# Patient Record
Sex: Female | Born: 1995 | Race: Black or African American | Hispanic: No | Marital: Single | Smoking: Never smoker
Health system: Southern US, Community
[De-identification: ages and names within clinical notes are randomized; demographics above are authoritative.]

## PROBLEM LIST (undated history)

## (undated) ENCOUNTER — Inpatient Hospital Stay (HOSPITAL_COMMUNITY): Payer: Self-pay

## (undated) DIAGNOSIS — Z789 Other specified health status: Secondary | ICD-10-CM

## (undated) HISTORY — PX: BREAST LUMPECTOMY: SHX2

## (undated) HISTORY — DX: Other specified health status: Z78.9

## (undated) HISTORY — PX: TOOTH EXTRACTION: SUR596

---
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
## 2013-01-24 NOTE — L&D Delivery Note (Signed)
Delivery Note At 9:33 AM a viable female was delivered via  (Presentation LOA: ;  ).  APGAR: 9, 9; weight .   Placenta status: Intact, Spontaneous.  Cord: 2 vessels with the following complications: None.  Cord pH: not sent  Anesthesia: Epidural  Episiotomy: none Lacerations:second degree perineal  Suture Repair: 2.0 chromic Est. Blood Loss (mL): 300cc Placenta ot pathology  Mom to postpartum.  Baby to Couplet care / Skin to Skin.  Debbie Mercer A 03/14/2013, 9:59 AM

## 2013-02-20 LAB — OB RESULTS CONSOLE GBS: STREP GROUP B AG: NEGATIVE

## 2013-03-03 ENCOUNTER — Inpatient Hospital Stay (HOSPITAL_COMMUNITY)
Admission: AD | Admit: 2013-03-03 | Discharge: 2013-03-03 | Disposition: A | Discharge status: Home / Self Care | Payer: BC Managed Care – PPO | Source: Ambulatory Visit | Attending: Obstetrics and Gynecology | Admitting: Obstetrics and Gynecology

## 2013-03-03 ENCOUNTER — Encounter (HOSPITAL_COMMUNITY): Payer: Self-pay | Admitting: Family

## 2013-03-03 DIAGNOSIS — O479 False labor, unspecified: Secondary | ICD-10-CM | POA: Insufficient documentation

## 2013-03-03 DIAGNOSIS — O368190 Decreased fetal movements, unspecified trimester, not applicable or unspecified: Secondary | ICD-10-CM | POA: Insufficient documentation

## 2013-03-03 LAB — WET PREP, GENITAL
Clue Cells Wet Prep HPF POC: NONE SEEN
Trich, Wet Prep: NONE SEEN
Yeast Wet Prep HPF POC: NONE SEEN

## 2013-03-03 NOTE — MAU Provider Note (Signed)
  History     CSN: [id number]  Arrival date and time: 03/03/13 1143   None     Chief Complaint  Patient presents with  . Decreased Fetal Movement   HPI Comments: Pt is a G1P0 at 5347w2d arrives unannounced w multiple c/o, indigestion, reflux, ?ctx, abd tightening, watery discharge, denies LOF, or VB, also c/o decreased FM for 3 days.      Past Medical History  Diagnosis Date  . Medical history non-contributory     Past Surgical History  Procedure Laterality Date  . Breast lumpectomy      Family History  Problem Relation Age of Onset  . Hypertension Mother   . Diabetes Maternal Grandmother     History  Substance Use Topics  . Smoking status: Never Smoker   . Smokeless tobacco: Never Used  . Alcohol Use: No    Allergies:  Allergies  Allergen Reactions  . Shellfish Allergy Anaphylaxis    Prescriptions prior to admission  Medication Sig Dispense Refill  . Prenatal Vit-Fe Fumarate-FA (PRENATAL MULTIVITAMIN) TABS tablet Take 1 tablet by mouth daily at 12 noon.        Review of Systems  Gastrointestinal: Positive for heartburn and abdominal pain.  Genitourinary: Negative for dysuria.   Physical Exam   Blood pressure 115/71, pulse 101, temperature 98.2 F (36.8 C), temperature source Oral, resp. rate 12, last menstrual period 06/02/2012.  Physical Exam  Nursing note and vitals reviewed. Constitutional: She is oriented to person, place, and time. She appears well-developed and well-nourished. No distress.  HENT:  Head: Normocephalic.  Eyes: Pupils are equal, round, and reactive to light.  Neck: Normal range of motion.  Cardiovascular: Normal rate.   Respiratory: Effort normal.  GI: Soft.  Genitourinary: Vaginal discharge found.  Normal appearing leukorrhea, cervix FT/th/-2 vtx ballottable   Musculoskeletal: Normal range of motion.  Neurological: She is alert and oriented to person, place, and time. She has normal reflexes.  Skin: Skin is warm and dry.   Psychiatric: She has a normal mood and affect. Her behavior is normal.    MAU Course  Procedures     Assessment and Plan  IUP at 8347w2d FHR cat 1 toco - irreg  Wet prep - neg UA neg  Normal discomforts of pregnancy rv'd FKC and labor, f/u routine   Asaiah Scarber M 03/03/2013, 1:28 PM

## 2013-03-03 NOTE — MAU Note (Signed)
18 yo, G1P0 at 6571w2d, presents for decreased fetal movement since Thursday. Reports watery discharge noted this morning. Denies VB. Some contractions earlier, none at this time. Denies HSV.

## 2013-03-03 NOTE — Discharge Instructions (Signed)
Fetal Movement Counts °Patient Name: __________________________________________________ Patient Due Date: ____________________ °Performing a fetal movement count is highly recommended in high-risk pregnancies, but it is good for every pregnant woman to do. Your caregiver may ask you to start counting fetal movements at 28 weeks of the pregnancy. Fetal movements often increase: °· After eating a full meal. °· After physical activity. °· After eating or drinking something sweet or cold. °· At rest. °Pay attention to when you feel the baby is most active. This will help you notice a pattern of your baby's sleep and wake cycles and what factors contribute to an increase in fetal movement. It is important to perform a fetal movement count at the same time each day when your baby is normally most active.  °HOW TO COUNT FETAL MOVEMENTS °1. Find a quiet and comfortable area to sit or lie down on your left side. Lying on your left side provides the best blood and oxygen circulation to your baby. °2. Write down the day and time on a sheet of paper or in a journal. °3. Start counting kicks, flutters, swishes, rolls, or jabs in a 2 hour period. You should feel at least 10 movements within 2 hours. °4. If you do not feel 10 movements in 2 hours, wait 2 3 hours and count again. Look for a change in the pattern or not enough counts in 2 hours. °SEEK MEDICAL CARE IF: °· You feel less than 10 counts in 2 hours, tried twice. °· There is no movement in over an hour. °· The pattern is changing or taking longer each day to reach 10 counts in 2 hours. °· You feel the baby is not moving as he or she usually does. °Date: ____________ Movements: ____________ Start time: ____________ Finish time: ____________  °Date: ____________ Movements: ____________ Start time: ____________ Finish time: ____________ °Date: ____________ Movements: ____________ Start time: ____________ Finish time: ____________ °Date: ____________ Movements: ____________  Start time: ____________ Finish time: ____________ °Date: ____________ Movements: ____________ Start time: ____________ Finish time: ____________ °Date: ____________ Movements: ____________ Start time: ____________ Finish time: ____________ °Date: ____________ Movements: ____________ Start time: ____________ Finish time: ____________ °Date: ____________ Movements: ____________ Start time: ____________ Finish time: ____________  °Date: ____________ Movements: ____________ Start time: ____________ Finish time: ____________ °Date: ____________ Movements: ____________ Start time: ____________ Finish time: ____________ °Date: ____________ Movements: ____________ Start time: ____________ Finish time: ____________ °Date: ____________ Movements: ____________ Start time: ____________ Finish time: ____________ °Date: ____________ Movements: ____________ Start time: ____________ Finish time: ____________ °Date: ____________ Movements: ____________ Start time: ____________ Finish time: ____________ °Date: ____________ Movements: ____________ Start time: ____________ Finish time: ____________  °Date: ____________ Movements: ____________ Start time: ____________ Finish time: ____________ °Date: ____________ Movements: ____________ Start time: ____________ Finish time: ____________ °Date: ____________ Movements: ____________ Start time: ____________ Finish time: ____________ °Date: ____________ Movements: ____________ Start time: ____________ Finish time: ____________ °Date: ____________ Movements: ____________ Start time: ____________ Finish time: ____________ °Date: ____________ Movements: ____________ Start time: ____________ Finish time: ____________ °Date: ____________ Movements: ____________ Start time: ____________ Finish time: ____________  °Date: ____________ Movements: ____________ Start time: ____________ Finish time: ____________ °Date: ____________ Movements: ____________ Start time: ____________ Finish time:  ____________ °Date: ____________ Movements: ____________ Start time: ____________ Finish time: ____________ °Date: ____________ Movements: ____________ Start time: ____________ Finish time: ____________ °Date: ____________ Movements: ____________ Start time: ____________ Finish time: ____________ °Date: ____________ Movements: ____________ Start time: ____________ Finish time: ____________ °Date: ____________ Movements: ____________ Start time: ____________ Finish time: ____________  °Date: ____________ Movements: ____________ Start time: ____________ Finish   time: ____________ °Date: ____________ Movements: ____________ Start time: ____________ Finish time: ____________ °Date: ____________ Movements: ____________ Start time: ____________ Finish time: ____________ °Date: ____________ Movements: ____________ Start time: ____________ Finish time: ____________ °Date: ____________ Movements: ____________ Start time: ____________ Finish time: ____________ °Date: ____________ Movements: ____________ Start time: ____________ Finish time: ____________ °Date: ____________ Movements: ____________ Start time: ____________ Finish time: ____________  °Date: ____________ Movements: ____________ Start time: ____________ Finish time: ____________ °Date: ____________ Movements: ____________ Start time: ____________ Finish time: ____________ °Date: ____________ Movements: ____________ Start time: ____________ Finish time: ____________ °Date: ____________ Movements: ____________ Start time: ____________ Finish time: ____________ °Date: ____________ Movements: ____________ Start time: ____________ Finish time: ____________ °Date: ____________ Movements: ____________ Start time: ____________ Finish time: ____________ °Date: ____________ Movements: ____________ Start time: ____________ Finish time: ____________  °Date: ____________ Movements: ____________ Start time: ____________ Finish time: ____________ °Date: ____________ Movements:  ____________ Start time: ____________ Finish time: ____________ °Date: ____________ Movements: ____________ Start time: ____________ Finish time: ____________ °Date: ____________ Movements: ____________ Start time: ____________ Finish time: ____________ °Date: ____________ Movements: ____________ Start time: ____________ Finish time: ____________ °Date: ____________ Movements: ____________ Start time: ____________ Finish time: ____________ °Date: ____________ Movements: ____________ Start time: ____________ Finish time: ____________  °Date: ____________ Movements: ____________ Start time: ____________ Finish time: ____________ °Date: ____________ Movements: ____________ Start time: ____________ Finish time: ____________ °Date: ____________ Movements: ____________ Start time: ____________ Finish time: ____________ °Date: ____________ Movements: ____________ Start time: ____________ Finish time: ____________ °Date: ____________ Movements: ____________ Start time: ____________ Finish time: ____________ °Date: ____________ Movements: ____________ Start time: ____________ Finish time: ____________ °Document Released: 02/09/2006 Document Revised: 12/28/2011 Document Reviewed: 11/07/2011 °Exit[employer]® Patient Information ©2014 [employer]. ° ° °Braxton Hicks Contractions °Pregnancy is commonly associated with contractions of the uterus throughout the pregnancy. Towards the end of pregnancy (32 to 34 weeks), these contractions (Braxton Hicks) can develop more often and may become more forceful. This is not true labor because these contractions do not result in opening (dilatation) and thinning of the cervix. They are sometimes difficult to tell apart from true labor because these contractions can be forceful and people have different pain tolerances. You should not feel embarrassed if you go to the hospital with false labor. Sometimes, the only way to tell if you are in true labor is for your caregiver to follow the changes  in the cervix. °How to tell the difference between true and false labor: °· False labor. °· The contractions of false labor are usually shorter, irregular and not as hard as those of true labor. °· They are often felt in the front of the lower abdomen and in the groin. °· They may leave with walking around or changing positions while lying down. °· They get weaker and are shorter lasting as time goes on. °· These contractions are usually irregular. °· They do not usually become progressively stronger, regular and closer together as with true labor. °· True labor. °· Contractions in true labor last 30 to 70 seconds, become very regular, usually become more intense, and increase in frequency. °· They do not go away with walking. °· The discomfort is usually felt in the top of the uterus and spreads to the lower abdomen and low back. °· True labor can be determined by your caregiver with an exam. This will show that the cervix is dilating and getting thinner. °If there are no prenatal problems or other health problems associated with the pregnancy, it is completely safe to be sent home with false labor and await the onset of   true labor. °HOME CARE INSTRUCTIONS  °· Keep up with your usual exercises and instructions. °· Take medications as directed. °· Keep your regular prenatal appointment. °· Eat and drink lightly if you think you are going into labor. °· If BH contractions are making you uncomfortable: °· Change your activity position from lying down or resting to walking/walking to resting. °· Sit and rest in a tub of warm water. °· Drink 2 to 3 glasses of water. Dehydration may cause B-H contractions. °· Do slow and deep breathing several times an hour. °SEEK IMMEDIATE MEDICAL CARE IF:  °· Your contractions continue to become stronger, more regular, and closer together. °· You have a gushing, burst or leaking of fluid from the vagina. °· An oral temperature above 102° F (38.9° C) develops. °· You have passage of  blood-tinged mucus. °· You develop vaginal bleeding. °· You develop continuous belly (abdominal) pain. °· You have low back pain that you never had before. °· You feel the baby's head pushing down causing pelvic pressure. °· The baby is not moving as much as it used to. °Document Released: 01/10/2005 Document Revised: 04/04/2011 Document Reviewed: 10/22/2012 °ExitCare® Patient Information ©2014 [employer]. ° ° °

## 2013-03-05 ENCOUNTER — Inpatient Hospital Stay (HOSPITAL_COMMUNITY)
Admission: AD | Admit: 2013-03-05 | Discharge: 2013-03-05 | Disposition: A | Discharge status: Home / Self Care | Payer: BC Managed Care – PPO | Source: Ambulatory Visit | Attending: Obstetrics and Gynecology | Admitting: Obstetrics and Gynecology

## 2013-03-05 ENCOUNTER — Encounter (HOSPITAL_COMMUNITY): Payer: Self-pay

## 2013-03-05 ENCOUNTER — Inpatient Hospital Stay (HOSPITAL_COMMUNITY): Payer: BC Managed Care – PPO

## 2013-03-05 DIAGNOSIS — R109 Unspecified abdominal pain: Secondary | ICD-10-CM | POA: Insufficient documentation

## 2013-03-05 DIAGNOSIS — O4690 Antepartum hemorrhage, unspecified, unspecified trimester: Secondary | ICD-10-CM | POA: Insufficient documentation

## 2013-03-05 DIAGNOSIS — Z91013 Allergy to seafood: Secondary | ICD-10-CM | POA: Diagnosis not present

## 2013-03-05 DIAGNOSIS — Z9889 Other specified postprocedural states: Secondary | ICD-10-CM

## 2013-03-05 DIAGNOSIS — O368190 Decreased fetal movements, unspecified trimester, not applicable or unspecified: Secondary | ICD-10-CM | POA: Insufficient documentation

## 2013-03-05 DIAGNOSIS — Q270 Congenital absence and hypoplasia of umbilical artery: Secondary | ICD-10-CM

## 2013-03-05 DIAGNOSIS — IMO0002 Reserved for concepts with insufficient information to code with codable children: Secondary | ICD-10-CM | POA: Diagnosis present

## 2013-03-05 DIAGNOSIS — O479 False labor, unspecified: Secondary | ICD-10-CM | POA: Insufficient documentation

## 2013-03-05 DIAGNOSIS — K219 Gastro-esophageal reflux disease without esophagitis: Secondary | ICD-10-CM | POA: Diagnosis not present

## 2013-03-05 NOTE — Discharge Instructions (Signed)

## 2013-03-05 NOTE — MAU Note (Signed)
History   18 yo G1P0 at 6238 4/7 weeks presented unannounced c/o crampng and small amount of bleeding this am.  Noting FM today since awakening.  Was seen in MAU on 2/8 for ? Leaking and decreased FM--negative findings, except for leukorrhea, cervix FT/thick, vtx, -2, ballotable.  [facility], with cervix FT, 50%, vtx, -2, no evidence SROM, and reactive NST.  Last US at 36 5/7 weeks, with EFW 7+15, 90%ile, AFI 15.7, 60%ile.  Next US scheduled 2/17.  Patient Active Problem List   Diagnosis Date Noted  . 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 GERD (gastroesophageal reflux disease) 03/05/2013  . LGA (large for gestational age) fetus--90%ile at 4436 5/7 weeks. 03/05/2013     Chief Complaint  Patient presents with  . Vaginal Bleeding   HPI:  See above.  OB History   Grav Para Term Preterm Abortions TAB SAB Ect Mult Living   1               Past Medical History  Diagnosis Date  . Medical history non-contributory     Past Surgical History  Procedure Laterality Date  . Breast lumpectomy      Family History  Problem Relation Age of Onset  . Hypertension Mother   . Diabetes Maternal Grandmother     History  Substance Use Topics  . Smoking status: Never Smoker   . Smokeless tobacco: Never Used  . Alcohol Use: No    Allergies:  Allergies  Allergen Reactions  . Shellfish Allergy Anaphylaxis    Prescriptions prior to admission  Medication Sig Dispense Refill  . Prenatal Vit-Fe Fumarate-FA (PRENATAL MULTIVITAMIN) TABS tablet Take 1 tablet by mouth daily at 12 noon.        ROS:  Cramping, bleeding, +FM Physical Exam   Blood pressure 119/71, pulse 105, temperature 98.2 F (36.8 C), temperature source Oral, last menstrual period 06/02/2012.  Physical Exam In NAD Chest clear Heart RRR without murmur Abd gravid, NT Pelvic--no active bleeding at present, cervix posterior, FT, 50%, vtx, -2,  small amount bloody show on glove after exam. Ext WNL  FHR segments of Category 1, with clear reactivity, then segments of less variability but no decels--? Sleep cycles. UCs q 7 min, mild.  ED Course  IUP at 38 4/7 weeks ? Early vs prodromal labor Vaginal bleeding  Plan: Observe  BPP/AFI   Mathius Birkeland CNM, [place] 03/05/2013 6:10 AM  Addendum: Returned from US--BPP 8/8, vtx, AFI 17.93, 72%ile. UCs remain q 7-8 min, mild. Small amount bloody mucus with wiping, no active bleeding noted.  Labor precautions reviewed, including FKCs. D/C'd home [facility] and US for growth. Encouraged patient to call with any questions or concerns. Note for work today.  Nigel BridgemanVicki Newell Wafer, CNM 03/05/13 7:20a

## 2013-03-05 NOTE — MAU Note (Signed)
Watery discharge 2 days ago and went to doctor's office yesterday and was told she was 1/2 cm.  Started having cramps this morning then noticed reddish bleeding like the "beginning of a period" , has gotten less since arriving here, pt stated.  Reports decreased baby movement for last 3 days.  States moves some when she eats but then he stops.

## 2013-03-12 ENCOUNTER — Encounter (HOSPITAL_COMMUNITY): Payer: Self-pay | Admitting: *Deleted

## 2013-03-12 ENCOUNTER — Observation Stay (HOSPITAL_COMMUNITY)
Admission: AD | Admit: 2013-03-12 | Discharge: 2013-03-12 | Disposition: A | Discharge status: Home / Self Care | Payer: BC Managed Care – PPO | Source: Ambulatory Visit | Attending: Obstetrics and Gynecology | Admitting: Obstetrics and Gynecology

## 2013-03-12 DIAGNOSIS — O479 False labor, unspecified: Principal | ICD-10-CM | POA: Insufficient documentation

## 2013-03-12 LAB — AMNISURE RUPTURE OF MEMBRANE (ROM) NOT AT ARMC: Amnisure ROM: NEGATIVE

## 2013-03-12 MED ORDER — BUTORPHANOL TARTRATE 1 MG/ML IJ SOLN
2.0000 mg | Freq: Once | INTRAMUSCULAR | Status: AC
  Administered 2013-03-12: 2 mg via INTRAMUSCULAR
  Filled 2013-03-12: qty 2

## 2013-03-12 MED ORDER — PROMETHAZINE HCL 25 MG/ML IJ SOLN: 12.5000 mg | Freq: Once | INTRAMUSCULAR | Status: DC

## 2013-03-12 MED ORDER — PROMETHAZINE HCL 25 MG/ML IJ SOLN
12.5000 mg | Freq: Once | INTRAMUSCULAR | Status: AC
  Administered 2013-03-12: 12.5 mg via INTRAMUSCULAR
  Filled 2013-03-12: qty 1

## 2013-03-12 MED ORDER — BUTORPHANOL TARTRATE 1 MG/ML IJ SOLN: 2.0000 mg | Freq: Once | INTRAMUSCULAR | Status: DC

## 2013-03-12 NOTE — MAU Note (Signed)
?   Leaking this morning when got up. No bleeding.  Some cramping.

## 2013-03-12 NOTE — Progress Notes (Signed)
Discharge note Pt still sleepy contractions have decreased in frequency BP 106/64  Pulse 111  Temp(Src) 98.1 F (36.7 C) (Oral)  Resp 18  LMP 06/02/2012 Category 1 toco q 15 min cx unchanged.   DC home with labor precautions

## 2013-03-12 NOTE — MAU Provider Note (Addendum)
  History     CSN: [id number]  Arrival date and time: 03/12/13 1033   None     Chief Complaint  Patient presents with  . Labor Eval   HPI Pt presents for contractions and questionable leakage of fluid  OB History   Grav Para Term Preterm Abortions TAB SAB Ect Mult Living   1               Past Medical History  Diagnosis Date  . Medical history non-contributory     Past Surgical History  Procedure Laterality Date  . Breast lumpectomy      Family History  Problem Relation Age of Onset  . Hypertension Mother   . Diabetes Maternal Grandmother     History  Substance Use Topics  . Smoking status: Never Smoker   . Smokeless tobacco: Never Used  . Alcohol Use: No    Allergies:  Allergies  Allergen Reactions  . Shellfish Allergy Anaphylaxis    Prescriptions prior to admission  Medication Sig Dispense Refill  . Prenatal Vit-Fe Fumarate-FA (PRENATAL MULTIVITAMIN) TABS tablet Take 1 tablet by mouth daily at 12 noon.        ROS Physical Exam   Blood pressure 116/76, pulse 113, temperature 98.1 F (36.7 C), temperature source Oral, resp. rate 18, last menstrual period 06/02/2012.  Physical Exam cx 4-5/80/-3  MAU Course  Procedures  MD   Assessment and Plan  Contractions without cervical change Will give stadol and phenergan and admit if cervical change NST category 1  Larina Lieurance A 03/12/2013, 12:45 PM

## 2013-03-12 NOTE — Discharge Instructions (Signed)
Natural Childbirth °Natural childbirth is going through labor and delivery without any drugs to relieve pain. You also do not use fetal monitors, have a cesarean delivery, or get a sugical cut to enlarge the vaginal opening (episiotomy). With the help of a birthing professional (midwife), you will direct your own labor and delivery as you choose. °Many women chose natural childbirth because they feel more in control and in touch with their labor and delivery. They are also concerned about the medications affecting themselves and the baby. °Pregnant women with a high risk pregnancy should not attempt natural childbirth. It is better to deliver the infant in a hospital if an emergency situation arises. Sometimes, the caregiver has to intervene for the health and safety of the mother and infant. °TWO TECHNIQUES FOR NATURAL CHILDBIRTH:  °· The Lamaze method. This method teaches women that having a baby is normal, healthy, and natural. It also teaches the mother to take a neutral position regarding pain medication and anesthesia and to make an informed decision if and when it is right for them. °· The Bradley method (also called husband coached birth). This method teaches the father to be the birth coach and stresses a natural approach. It also encourages exercise and a balanced diet with good nutrition. The exercises teach relaxation and deep breathing techniques. However, there are also classes to prepare the parents for an emergency situation that may occur. °METHODS OF DEALING WITH LABOR PAIN AND DELIVERY: °· Meditation. °· Yoga. °· Hypnosis. °· Acupuncture. °· Massage. °· Changing positions (walking, rocking, showering, leaning on birth balls). °· Lying in warm water or a jacuzzi. °· Find an activity that keeps your mind off of the labor pain. °· Listen to soft music. °· Visual imagery (focus on a particular object). °BEFORE GOING INTO LABOR °· Be sure you and your spouse/partner are in agreement to have natural  childbirth. °· Decide if your caregiver or a midwife will deliver your baby. °· Decide if you will have your baby in the hospital, birthing center, or at home. °· If you have children, make plans to have someone to take care of them when you go to the hospital. °· Know the distance and the time it takes to go to the delivery center. Make a dry run to be sure. °· Have a bag packed with a night gown, bathrobe, and toiletries ready to take when you go into labor. °· Keep phone numbers of your family and friends handy if you need to call someone when you go into labor. °· Your spouse or partner should go to all the teaching classes. °· Talk with your caregiver about the possibility of a medical emergency and what will happen if that occurs. °ADVANTAGES OF NATURAL CHILDBIRTH °· You are in control of your labor and delivery. °· It is safe. °· There are no medications or anesthetics that may affect you and the fetus. °· There are no invasive procedures such as an episiotomy. °· You and your partner will work together, which can increase your bond. °· Meditation, yoga, massage, and breathing exercises can be learned while pregnant and help you when you are in labor and at delivery. °· In most delivery centers, the family and friends can be involved in the labor and delivery process. °DISADVANTAGES OF NATURAL CHILDBIRTH °· You will experience pain during your labor and delivery. °· The methods of helping relieve your labor pains may not work for you. °· You may feel embarrassed, disappointed, and like a failure   if you decide to change your mind during labor and not have natural childbirth. °AFTER THE DELIVERY °· You will be very tired. °· You will be uncomfortable because of your uterus contracting. You will feel soreness around the vagina. °· You may feel cold and shaky.This is a natural reaction. °· You will be excited, overwhelmed, accomplished, and proud to be a mother. °HOME CARE INSTRUCTIONS  °· Follow the advice and  instructions of your caregiver. °· Follow the instructions of your natural childbirth instructor (Lamaze or Bradley Method). °Document Released: 12/24/2007 Document Revised: 04/04/2011 Document Reviewed: 09/17/2012 °ExitCare® Patient Information ©2014 [employer]. ° °

## 2013-03-13 ENCOUNTER — Inpatient Hospital Stay (HOSPITAL_COMMUNITY)
Admission: AD | Admit: 2013-03-13 | Discharge: 2013-03-16 | DRG: 775 | Disposition: A | Discharge status: Home / Self Care | Payer: BC Managed Care – PPO | Source: Ambulatory Visit | Attending: Obstetrics and Gynecology | Admitting: Obstetrics and Gynecology

## 2013-03-13 ENCOUNTER — Encounter (HOSPITAL_COMMUNITY): Payer: Self-pay | Admitting: *Deleted

## 2013-03-13 DIAGNOSIS — IMO0001 Reserved for inherently not codable concepts without codable children: Secondary | ICD-10-CM

## 2013-03-13 DIAGNOSIS — O99892 Other specified diseases and conditions complicating childbirth: Secondary | ICD-10-CM | POA: Diagnosis present

## 2013-03-13 DIAGNOSIS — K219 Gastro-esophageal reflux disease without esophagitis: Secondary | ICD-10-CM | POA: Diagnosis present

## 2013-03-13 DIAGNOSIS — O9989 Other specified diseases and conditions complicating pregnancy, childbirth and the puerperium: Secondary | ICD-10-CM

## 2013-03-13 LAB — CBC
HEMATOCRIT: 40.6 % (ref 36.0–46.0)
HEMOGLOBIN: 13.4 g/dL (ref 12.0–15.0)
MCH: 24.3 pg — AB (ref 26.0–34.0)
MCHC: 33 g/dL (ref 30.0–36.0)
MCV: 73.7 fL — ABNORMAL LOW (ref 78.0–100.0)
Platelets: 294 10*3/uL (ref 150–400)
RBC: 5.51 MIL/uL — ABNORMAL HIGH (ref 3.87–5.11)
RDW: 13.9 % (ref 11.5–15.5)
WBC: 16.5 10*3/uL — ABNORMAL HIGH (ref 4.0–10.5)

## 2013-03-13 MED ORDER — EPHEDRINE 5 MG/ML INJ
10.0000 mg | INTRAVENOUS | Status: DC | PRN
  Filled 2013-03-13: qty 2

## 2013-03-13 MED ORDER — OXYCODONE-ACETAMINOPHEN 5-325 MG PO TABS: 1.0000 | ORAL_TABLET | ORAL | Status: DC | PRN

## 2013-03-13 MED ORDER — LACTATED RINGERS IV SOLN
500.0000 mL | INTRAVENOUS | Status: DC | PRN
  Administered 2013-03-14 (×2): 500 mL via INTRAVENOUS

## 2013-03-13 MED ORDER — OXYTOCIN BOLUS FROM INFUSION: 500.0000 mL | INTRAVENOUS | Status: DC

## 2013-03-13 MED ORDER — LIDOCAINE HCL (PF) 1 % IJ SOLN
30.0000 mL | INTRAMUSCULAR | Status: AC | PRN
  Administered 2013-03-14: 30 mL via SUBCUTANEOUS
  Filled 2013-03-13: qty 30

## 2013-03-13 MED ORDER — EPHEDRINE 5 MG/ML INJ
10.0000 mg | INTRAVENOUS | Status: DC | PRN
  Filled 2013-03-13: qty 2
  Filled 2013-03-13 (×2): qty 4

## 2013-03-13 MED ORDER — OXYTOCIN 40 UNITS IN LACTATED RINGERS INFUSION - SIMPLE MED
62.5000 mL/h | INTRAVENOUS | Status: DC
  Administered 2013-03-14: 62.5 mL/h via INTRAVENOUS
  Filled 2013-03-13: qty 1000

## 2013-03-13 MED ORDER — ACETAMINOPHEN 325 MG PO TABS: 650.0000 mg | ORAL_TABLET | ORAL | Status: DC | PRN

## 2013-03-13 MED ORDER — LACTATED RINGERS IV SOLN
500.0000 mL | Freq: Once | INTRAVENOUS | Status: AC
  Administered 2013-03-13: 500 mL via INTRAVENOUS

## 2013-03-13 MED ORDER — PHENYLEPHRINE 40 MCG/ML (10ML) SYRINGE FOR IV PUSH (FOR BLOOD PRESSURE SUPPORT)
80.0000 ug | PREFILLED_SYRINGE | INTRAVENOUS | Status: DC | PRN
  Administered 2013-03-14 (×2): 80 ug via INTRAVENOUS
  Filled 2013-03-13 (×3): qty 10
  Filled 2013-03-13: qty 2

## 2013-03-13 MED ORDER — FLEET ENEMA 7-19 GM/118ML RE ENEM: 1.0000 | ENEMA | RECTAL | Status: DC | PRN

## 2013-03-13 MED ORDER — ONDANSETRON HCL 4 MG/2ML IJ SOLN: 4.0000 mg | Freq: Four times a day (QID) | INTRAMUSCULAR | Status: DC | PRN

## 2013-03-13 MED ORDER — IBUPROFEN 600 MG PO TABS: 600.0000 mg | ORAL_TABLET | Freq: Four times a day (QID) | ORAL | Status: DC | PRN

## 2013-03-13 MED ORDER — DIPHENHYDRAMINE HCL 50 MG/ML IJ SOLN: 12.5000 mg | INTRAMUSCULAR | Status: DC | PRN

## 2013-03-13 MED ORDER — FENTANYL 2.5 MCG/ML BUPIVACAINE 1/10 % EPIDURAL INFUSION (WH - ANES)
14.0000 mL/h | INTRAMUSCULAR | Status: DC | PRN
  Administered 2013-03-14: 14 mL/h via EPIDURAL
  Filled 2013-03-13 (×2): qty 125

## 2013-03-13 MED ORDER — LACTATED RINGERS IV SOLN
INTRAVENOUS | Status: DC
  Administered 2013-03-13 – 2013-03-14 (×2): via INTRAVENOUS

## 2013-03-13 MED ORDER — PHENYLEPHRINE 40 MCG/ML (10ML) SYRINGE FOR IV PUSH (FOR BLOOD PRESSURE SUPPORT)
80.0000 ug | PREFILLED_SYRINGE | INTRAVENOUS | Status: AC | PRN
  Administered 2013-03-14 (×3): 80 ug via INTRAVENOUS

## 2013-03-13 MED ORDER — CITRIC ACID-SODIUM CITRATE 334-500 MG/5ML PO SOLN: 30.0000 mL | ORAL | Status: DC | PRN

## 2013-03-13 NOTE — MAU Note (Signed)
Pt G1 at 39.5wks having contractions every .  Denies leaking or bleeding.

## 2013-03-14 ENCOUNTER — Inpatient Hospital Stay (HOSPITAL_COMMUNITY): Payer: BC Managed Care – PPO | Admitting: Anesthesiology

## 2013-03-14 ENCOUNTER — Encounter (HOSPITAL_COMMUNITY): Payer: BC Managed Care – PPO | Admitting: Anesthesiology

## 2013-03-14 ENCOUNTER — Encounter (HOSPITAL_COMMUNITY): Payer: Self-pay | Admitting: *Deleted

## 2013-03-14 LAB — RPR: RPR Ser Ql: NONREACTIVE

## 2013-03-14 MED ORDER — MEASLES, MUMPS & RUBELLA VAC ~~LOC~~ INJ: 0.5000 mL | INJECTION | Freq: Once | SUBCUTANEOUS | Status: DC

## 2013-03-14 MED ORDER — ZOLPIDEM TARTRATE 5 MG PO TABS: 5.0000 mg | ORAL_TABLET | Freq: Every evening | ORAL | Status: DC | PRN

## 2013-03-14 MED ORDER — SIMETHICONE 80 MG PO CHEW: 80.0000 mg | CHEWABLE_TABLET | ORAL | Status: DC | PRN

## 2013-03-14 MED ORDER — PRENATAL MULTIVITAMIN CH
1.0000 | ORAL_TABLET | Freq: Every day | ORAL | Status: DC
  Administered 2013-03-14 – 2013-03-15 (×2): 1 via ORAL
  Filled 2013-03-14 (×2): qty 1

## 2013-03-14 MED ORDER — DIPHENHYDRAMINE HCL 25 MG PO CAPS: 25.0000 mg | ORAL_CAPSULE | Freq: Four times a day (QID) | ORAL | Status: DC | PRN

## 2013-03-14 MED ORDER — FENTANYL CITRATE 0.05 MG/ML IJ SOLN: 100.0000 ug | INTRAMUSCULAR | Status: DC | PRN

## 2013-03-14 MED ORDER — SENNOSIDES-DOCUSATE SODIUM 8.6-50 MG PO TABS
2.0000 | ORAL_TABLET | ORAL | Status: DC
  Administered 2013-03-15: 2 via ORAL
  Filled 2013-03-14 (×2): qty 2

## 2013-03-14 MED ORDER — WITCH HAZEL-GLYCERIN EX PADS: 1.0000 "application " | MEDICATED_PAD | CUTANEOUS | Status: DC | PRN

## 2013-03-14 MED ORDER — ONDANSETRON HCL 4 MG PO TABS: 4.0000 mg | ORAL_TABLET | ORAL | Status: DC | PRN

## 2013-03-14 MED ORDER — TETANUS-DIPHTH-ACELL PERTUSSIS 5-2.5-18.5 LF-MCG/0.5 IM SUSP: 0.5000 mL | Freq: Once | INTRAMUSCULAR | Status: DC

## 2013-03-14 MED ORDER — IBUPROFEN 600 MG PO TABS
600.0000 mg | ORAL_TABLET | Freq: Four times a day (QID) | ORAL | Status: DC
  Administered 2013-03-14 – 2013-03-16 (×6): 600 mg via ORAL
  Filled 2013-03-14 (×7): qty 1

## 2013-03-14 MED ORDER — BENZOCAINE-MENTHOL 20-0.5 % EX AERO
1.0000 "application " | INHALATION_SPRAY | CUTANEOUS | Status: DC | PRN
  Administered 2013-03-14: 1 via TOPICAL
  Filled 2013-03-14: qty 56

## 2013-03-14 MED ORDER — OXYCODONE-ACETAMINOPHEN 5-325 MG PO TABS: 1.0000 | ORAL_TABLET | ORAL | Status: DC | PRN

## 2013-03-14 MED ORDER — ONDANSETRON HCL 4 MG/2ML IJ SOLN: 4.0000 mg | INTRAMUSCULAR | Status: DC | PRN

## 2013-03-14 MED ORDER — LIDOCAINE HCL (PF) 1 % IJ SOLN
INTRAMUSCULAR | Status: DC | PRN
  Administered 2013-03-14 (×4): 4 mL

## 2013-03-14 MED ORDER — LANOLIN HYDROUS EX OINT: TOPICAL_OINTMENT | CUTANEOUS | Status: DC | PRN

## 2013-03-14 MED ORDER — DIBUCAINE 1 % RE OINT: 1.0000 "application " | TOPICAL_OINTMENT | RECTAL | Status: DC | PRN

## 2013-03-14 NOTE — Progress Notes (Signed)
Patient ID: Debbie Mercer, female   DOB: [birth year], 18 y.o.   MRN: [id number] Debbie Mercer is a 18 y.o. G1P0000 at 8424w6d admitted for labor  Subjective: Comfortable w epidural   Objective: BP 120/74  Pulse 123  Temp(Src) 99.1 F (37.3 C) (Oral)  Resp 20  Ht 5\' 3"  (1.6 Mercer)  Wt 206 lb (93.441 kg)  BMI 36.50 kg/m2  SpO2 100%  LMP 06/02/2012  Total I/O In: -  Out: 225 [Urine:225]  FHT:  Cat 1/2, FHR decel w slow return to BL over 3min, mod variability, responded to IVF's, and phenylephreine given for hypotension  UC:   toco 2-3   SVE:   Dilation: Lip/rim Effacement (%): 100 Station: 0;+1 Exam by:: S Maren Wiesen CNM    Assessment / Plan:  Labor: slow change in transition phase Preeclampsia:  no s/s Fetal Wellbeing:  Category I and Category II Pain Control:  Epidural Anticipated MOD:  NSVD  GBS neg Expectant mgmnt at present, consider augmentation if no change in 2-3hrs Will await pt feeling more pressure to recheck   Dr Charlotta Newtonzan en route, viewed FHR tracing remotely Updated on status, and agrees w plan    Debbie Mercer 03/14/2013, 6:57 AM

## 2013-03-14 NOTE — Anesthesia Preprocedure Evaluation (Signed)
Anesthesia Evaluation  Patient identified by MRN, date of birth, ID band Patient awake    Reviewed: Allergy & Precautions, H&P , NPO status , Patient's Chart, lab work & pertinent test results, reviewed documented beta blocker date and time   History of Anesthesia Complications Negative for: history of anesthetic complications  Airway Mallampati: III TM Distance: >3 FB Neck ROM: full    Dental  (+) Teeth Intact   Pulmonary neg pulmonary ROS,  breath sounds clear to auscultation        Cardiovascular negative cardio ROS  Rhythm:regular Rate:Normal     Neuro/Psych negative neurological ROS  negative psych ROS   GI/Hepatic Neg liver ROS, GERD-  ,  Endo/Other  negative endocrine ROSBMI 37  Renal/GU negative Renal ROS     Musculoskeletal   Abdominal   Peds  Hematology negative hematology ROS (+)   Anesthesia Other Findings   Reproductive/Obstetrics (+) Pregnancy                           Anesthesia Physical Anesthesia Plan  ASA: II  Anesthesia Plan: Epidural   Post-op Pain Management:    Induction:   Airway Management Planned:   Additional Equipment:   Intra-op Plan:   Post-operative Plan:   Informed Consent: I have reviewed the patients History and Physical, chart, labs and discussed the procedure including the risks, benefits and alternatives for the proposed anesthesia with the patient or authorized representative who has indicated his/her understanding and acceptance.     Plan Discussed with:   Anesthesia Plan Comments:         Anesthesia Quick Evaluation

## 2013-03-14 NOTE — Progress Notes (Signed)
Patient ID: Debbie Mercer, female   DOB: 09/22/1995, 18 y.o.   MRN: [id number] Debbie Mercer is a 18 y.o. G1P0000 at 2576w6d admitted for labor   Subjective: Comfortable w epidural, feeling some pressure   Objective: BP 132/95  Pulse 118  Temp(Src) 99.1 F (37.3 C) (Oral)  Resp 18  Ht 5\' 3"  (1.6 m)  Wt 206 lb (93.441 kg)  BMI 36.50 kg/m2  SpO2 100%  LMP 06/02/2012     FHT:  Cat 1 UC:   toco 2-3   SVE:  Ant. Lip, vtx now at 0/+1   Assessment / Plan:  Labor: Progressing normally and slow progress in transition Preeclampsia:  no s/s Fetal Wellbeing:  Category I Pain Control:  Epidural Anticipated MOD:  NSVD  GBS neg  Slow change since admission, but vtx descending well,  Will allow laboring down until pt feeling stronger urge to push   Dr Charlotta Newtonzan updated    Malissa HippoLILLARD,Wonda Goodgame M 03/14/2013, 10:10 AM

## 2013-03-14 NOTE — H&P (Signed)
Debbie Mercer is a 18 y.o. female presenting for labor eval. Ctx all evening, now stronger, some bloody show earlier, denies LOF, GFM. VE=8/100/-1 BBOW   Pregnancy course: Pt began [facility] at 16wks, tx care to [facility] at 19wks.  Quad screen nl Anatomy US, 2VC noted, otherwise WNL  1hr gtt WNL at 28wks Growth [place] at 32wks 80%  Growth [place] at 36wks, 90% 7#15oz  GBS/GC/CT neg    Maternal Medical History:  Reason for admission: Contractions.   Contractions: Onset was 6-12 hours ago.   Frequency: regular.   Perceived severity is strong.    Fetal activity: Perceived fetal activity is normal.   Last perceived fetal movement was within the past hour.    Prenatal complications: no prenatal complications 2VC   Prenatal Complications - Diabetes: none.    OB History   Grav Para Term Preterm Abortions TAB SAB Ect Mult Living   1 0 0            Past Medical History  Diagnosis Date  . Medical history non-contributory    Past Surgical History  Procedure Laterality Date  . Breast lumpectomy     Family History: family history includes Diabetes in her maternal grandmother; Hypertension in her mother. Social History:  reports that she has never smoked. She has never used smokeless tobacco. She reports that she does not drink alcohol or use illicit drugs.   Prenatal Transfer Tool  Maternal Diabetes: No Genetic Screening: Normal Maternal Ultrasounds/Referrals: Normal 2VC, growth at 36w 7#15oz/90%  Fetal Ultrasounds or other Referrals:  None Maternal Substance Abuse:  No Significant Maternal Medications:  None Significant Maternal Lab Results:  Lab values include: Group B Strep negative Other Comments:  None  Review of Systems  All other systems reviewed and are negative.    Dilation: 9 Effacement (%): 100 Station: 0;+1 Exam by:: K Fraley RN Blood pressure 105/54, pulse 123, temperature 99.1 F (37.3 C), temperature source Oral, resp. rate 20, height 5\' 3"  (1.6 m), weight  206 lb (93.441 kg), last menstrual period 06/02/2012, SpO2 100.00%. Maternal Exam:  Uterine Assessment: Contraction strength is firm.  Contraction frequency is regular.   Abdomen: Patient reports no abdominal tenderness. Fundal height is aga.   Estimated fetal weight is 8-9#.   Fetal presentation: vertex  Introitus: Normal vulva. Normal vagina.  Ferning test: not done.   Pelvis: adequate for delivery.   Cervix: Cervix evaluated by digital exam.     Fetal Exam Fetal Monitor Review: Mode: ultrasound.    Fetal State Assessment: Category I - tracings are normal.     Physical Exam  Nursing note and vitals reviewed. Constitutional: She is oriented to person, place, and time. She appears well-developed and well-nourished.  HENT:  Head: Normocephalic.  Eyes: Pupils are equal, round, and reactive to light.  Neck: Normal range of motion.  Cardiovascular: Normal rate, regular rhythm and normal heart sounds.   Respiratory: Effort normal and breath sounds normal.  GI: Soft. Bowel sounds are normal.  Genitourinary: Vagina normal.  Musculoskeletal: Normal range of motion.  Neurological: She is alert and oriented to person, place, and time. She has normal reflexes.  Skin: Skin is warm and dry.  Psychiatric: She has a normal mood and affect. Her behavior is normal.    Prenatal labs: ABO, Rh: A/POS/-- (08/14 1030) Antibody: NEG (08/14 1030) Rubella: 4.60 (08/14 1030) RPR: NON REAC (08/14 1030)  HBsAg: NEGATIVE (08/14 1030)  HIV: NON REACTIVE (08/14 1030)  GBS: Negative (01/28  0000)   Assessment/Plan: IUP at 9238w6d Active labor FHR cat 1 toco q2 GBS neg  Admit to b.s per c/w DR Charlotta Newtonzan Routine L&D orders Expectant mgmnt  Epidural per pt request    Gwendloyn Forsee M 03/14/2013, 5:14 AM

## 2013-03-14 NOTE — Lactation Note (Signed)
This note was copied from the chart of [name]. Lactation Consultation Note MBU RN reports mom has changed to formula/bo feeding and "LEAD" teaching was done.  I visited at 10 hours to offer support.  Mom declines at this time and will contact us if needed.    Patient Name: Boy Gaetana Michaelisalen Shoun BJYNW'GToday's Date: 03/14/2013     Maternal Data    Feeding Feeding Type: Bottle Fed - Formula Nipple Type: Regular  LATCH Score/Interventions Latch: Too sleepy or reluctant, no latch achieved, no sucking elicited. Intervention(s): Skin to skin;Teach feeding cues;Waking techniques Intervention(s): Adjust position;Assist with latch;Breast massage;Breast compression  Audible Swallowing: None Intervention(s): Skin to skin;Hand expression  Type of Nipple: Everted at rest and after stimulation  Comfort (Breast/Nipple): Soft / non-tender     Hold (Positioning): Assistance needed to correctly position infant at breast and maintain latch. Intervention(s): Breastfeeding basics reviewed;Support Pillows;Position options;Skin to skin  LATCH Score: 5  Lactation Tools Discussed/Used     Consult Status Consult Status: Complete    Shoptaw, Arvella MerlesJana Lynn 03/14/2013, 8:09 PM

## 2013-03-14 NOTE — Progress Notes (Signed)
Patient ID: Debbie Mercer, female DOB: 07-17-1995, [age] y.o. MRN: [id number]  Debbie Aguasalen R Gafford is a [age] y.o. G1P0000 at 6526w6d admitted for labor  Subjective:  Comfortable w epidural  Objective:  BP 98/74  Pulse 120  Temp(Src) 100 F (37.8 C) (Oral)  Resp 20  Ht 5\' 3"  (1.6 m)  Wt 206 lb (93.441 kg)  BMI 36.50 kg/m2  SpO2 98%  LMP 06/02/2012   FHT: Cat 1, now, after epidural prolonged decel, resolved after BP improved, position changes IVF bolus  UC: toco q2-4  SVE: Dilation: 9  Effacement (%): 100  Station: -2  Exam by:: Larose KellsK Fraley RN    Assessment / Plan:  Labor: Progressing normally  Preeclampsia: no s/s  Fetal Wellbeing: Category I and Category II  Pain Control: Epidural  Anticipated MOD: NSVD   GBS neg Expectant mgmnt at present    Update physician PRN  Malissa HippoLILLARD,Dajai Wahlert M  03/14/2013, 1:37 AM

## 2013-03-14 NOTE — Anesthesia Postprocedure Evaluation (Signed)
Anesthesia Post Note  Patient: Debbie Mercer  Procedure(s) Performed: * No procedures listed *  Anesthesia type: Epidural  Patient location: Mother/Baby  Post pain: Pain level controlled  Post assessment: Post-op Vital signs reviewed  Last Vitals:  Filed Vitals:   03/14/13 1256  BP: 124/69  Pulse: 115  Temp: 37.2 C  Resp: 20    Post vital signs: Reviewed  Level of consciousness:alert  Complications: No apparent anesthesia complications

## 2013-03-14 NOTE — Anesthesia Procedure Notes (Signed)
Epidural Patient location during procedure: OB Start time: 03/14/2013 12:03 AM  Staffing Performed by: anesthesiologist   Preanesthetic Checklist Completed: patient identified, site marked, surgical consent, pre-op evaluation, timeout performed, IV checked, risks and benefits discussed and monitors and equipment checked  Epidural Patient position: sitting Prep: site prepped and draped and DuraPrep Patient monitoring: continuous pulse ox and blood pressure Approach: midline Injection technique: LOR air  Needle:  Needle type: Tuohy  Needle gauge: 17 G Needle length: 9 cm and 9 Needle insertion depth: 7.5 cm Catheter type: closed end flexible Catheter size: 19 Gauge Catheter at skin depth: 12.5 cm Test dose: negative  Assessment Events: blood not aspirated, injection not painful, no injection resistance, negative IV test and no paresthesia  Additional Notes Discussed risk of headache, infection, bleeding, nerve injury and failed or incomplete block.  Patient voices understanding and wishes to proceed.  Epidural placed easily on first attempt.  No paresthesia.  Patient tolerated procedure well with no apparent complications.  Jasmine DecemberA. Zaryia Markel, MDReason for block:procedure for pain

## 2013-03-15 LAB — CBC
HEMATOCRIT: 31.3 % — AB (ref 36.0–46.0)
HEMOGLOBIN: 10.3 g/dL — AB (ref 12.0–15.0)
MCH: 24.3 pg — ABNORMAL LOW (ref 26.0–34.0)
MCHC: 32.9 g/dL (ref 30.0–36.0)
MCV: 73.8 fL — ABNORMAL LOW (ref 78.0–100.0)
Platelets: 229 10*3/uL (ref 150–400)
RBC: 4.24 MIL/uL (ref 3.87–5.11)
RDW: 13.9 % (ref 11.5–15.5)
WBC: 16.6 10*3/uL — ABNORMAL HIGH (ref 4.0–10.5)

## 2013-03-15 MED ORDER — POLYSACCHARIDE IRON COMPLEX 150 MG PO CAPS
150.0000 mg | ORAL_CAPSULE | Freq: Every day | ORAL | Status: DC
  Administered 2013-03-15 – 2013-03-16 (×2): 150 mg via ORAL
  Filled 2013-03-15 (×2): qty 1

## 2013-03-15 NOTE — Progress Notes (Signed)
Post Partum Day 1 Subjective: no complaints, up ad lib, voiding, tolerating PO and + flatus breastfeeding with support and assistance  Objective: Blood pressure 100/65, pulse 102, temperature 97.7 F (36.5 C), temperature source Oral, resp. rate 17, height 5\' 3"  (1.6 m), weight 206 lb (93.441 kg), last menstrual period 06/02/2012, SpO2 98.00%, unknown if currently breastfeeding.  Physical Exam:  General: alert and no distress Lochia: appropriate Uterine Fundus: firm Incision: healing well DVT Evaluation: No evidence of DVT seen on physical exam. No significant calf/ankle edema. negativeHoman's sign.   Recent Labs  03/13/13 2315 03/15/13 0602  HGB 13.4 10.3*  HCT 40.6 31.3*    Assessment/Plan: Breastfeeding, Lactation consult and Contraception oral contraceptive pill Start Fe+ today Consider d/c: depending on pediatrician and breastfeeding progression Outpatient Circumcision    LOS: 2 days   Kenise Barraco M 03/15/2013, 11:20 AM

## 2013-03-16 MED ORDER — NORETHINDRONE 0.35 MG PO TABS: 1.0000 | ORAL_TABLET | Freq: Every day | ORAL | Status: DC

## 2013-03-16 NOTE — Discharge Instructions (Signed)
Vaginal Delivery Care After Refer to this sheet in the next few weeks. These discharge instructions provide you with information on caring for yourself after delivery. Your caregiver may also give you specific instructions. Your treatment has been planned according to the most current medical practices available, but problems sometimes occur. Call your caregiver if you have any problems or questions after you go home. HOME CARE INSTRUCTIONS  Take over-the-counter or prescription medicines only as directed by your caregiver or pharmacist. - you can take motrin/ibuprofen for discomfort or tylenol. You should take a stool softener/colace, 1-2x/day until after normal BM's   Do not drink alcohol, especially if you are breastfeeding or taking medicine to relieve pain.  Do not chew or smoke tobacco.  Do not use illegal drugs.  Continue to use good perineal care. Good perineal care includes:  Wiping your perineum from front to back.  Keeping your perineum clean.  Do not use tampons or douche until your caregiver says it is okay.  Shower, wash your hair, and take tub baths as directed by your caregiver.  Wear a well-fitting bra that provides breast support.  Eat healthy foods.  Drink enough fluids to keep your urine clear or pale yellow.  Eat high-fiber foods such as whole grain cereals and breads, brown rice, beans, and fresh fruits and vegetables every day. These foods may help prevent or relieve constipation.  Follow your cargiver's recommendations regarding resumption of activities such as climbing stairs, driving, lifting, exercising, or traveling.  Talk to your caregiver about resuming sexual activities. Resumption of sexual activities is dependent upon your risk of infection, your rate of healing, and your comfort and desire to resume sexual activity.  Try to have someone help you with your household activities and your newborn for at least a few days after you leave the  hospital.  Rest as much as possible. Try to rest or take a nap when your newborn is sleeping.  Increase your activities gradually.  Keep all of your scheduled postpartum appointments. It is very important to keep your scheduled follow-up appointments. At these appointments, your caregiver will be checking to make sure that you are healing physically and emotionally. SEEK MEDICAL CARE IF:   You are passing large clots from your vagina. Save any clots to show your caregiver.  You have a foul smelling discharge from your vagina.  You have trouble urinating.  You are urinating frequently.  You have pain when you urinate.  You have a change in your bowel movements.  You have increasing redness, pain, or swelling near your vaginal incision (episiotomy) or vaginal tear.  You have pus draining from your episiotomy or vaginal tear.  Your episiotomy or vaginal tear is separating.  You have painful, hard, or reddened breasts.  You have a severe headache.  You have blurred vision or see spots.  You feel sad or depressed.  You have thoughts of hurting yourself or your newborn.  You have questions about your care, the care of your newborn, or medicines.  You are dizzy or lightheaded.  You have a rash.  You have nausea or vomiting.  You were breastfeeding and have not had a menstrual period within 12 weeks after you stopped breastfeeding.  You are not breastfeeding and have not had a menstrual period by the 12th week after delivery.  You have a fever. SEEK IMMEDIATE MEDICAL CARE IF:   You have persistent pain.  You have chest pain.  You have shortness of breath.  You  faint.  You have leg pain.  You have stomach pain.  Your vaginal bleeding saturates two or more sanitary pads in 1 hour. MAKE SURE YOU:   Understand these instructions.  Will watch your condition.  Will get help right away if you are not doing well or get worse. Document Released: 01/08/2000  Document Revised: 10/05/2011 Document Reviewed: 09/07/2011 [facility] Patient Information 2014 [place], [place].  Postpartum Depression and Baby Blues The postpartum period begins right after the birth of a baby. During this time, there is often a great amount of joy and excitement. It is also a time of considerable changes in the life of the parent(s). Regardless of how many times a mother gives birth, each child brings new challenges and dynamics to the family. It is not unusual to have feelings of excitement accompanied by confusing shifts in moods, emotions, and thoughts. All mothers are at risk of developing postpartum depression or the "baby blues." These mood changes can occur right after giving birth, or they may occur many months after giving birth. The baby blues or postpartum depression can be mild or severe. Additionally, postpartum depression can resolve rather quickly, or it can be a long-term condition. CAUSES Elevated hormones and their rapid decline are thought to be a main cause of postpartum depression and the baby blues. There are a number of hormones that radically change during and after pregnancy. Estrogen and progesterone usually decrease immediately after delivering your baby. The level of thyroid hormone and various cortisol steroids also rapidly drop. Other factors that play a major role in these changes include major life events and genetics.  RISK FACTORS If you have any of the following risks for the baby blues or postpartum depression, know what symptoms to watch out for during the postpartum period. Risk factors that may increase the likelihood of getting the baby blues or postpartum depression include:  Havinga personal or family history of depression.  Having depression while being pregnant.  Having premenstrual or oral contraceptive-associated mood issues.  Having exceptional life stress.  Having marital conflict.  Lacking a social support network.  Having a  baby with special needs.  Having health problems such as diabetes. SYMPTOMS Baby blues symptoms include:  Brief fluctuations in mood, such as going from extreme happiness to sadness.  Decreased concentration.  Difficulty sleeping.  Crying spells, tearfulness.  Irritability.  Anxiety. Postpartum depression symptoms typically begin within the first month after giving birth. These symptoms include:  Difficulty sleeping or excessive sleepiness.  Marked weight loss.  Agitation.  Feelings of worthlessness.  Lack of interest in activity or food. Postpartum psychosis is a very concerning condition and can be dangerous. Fortunately, it is rare. Displaying any of the following symptoms is cause for immediate medical attention. Postpartum psychosis symptoms include:  Hallucinations and delusions.  Bizarre or disorganized behavior.  Confusion or disorientation. DIAGNOSIS  A diagnosis is made by an evaluation of your symptoms. There are no medical or lab tests that lead to a diagnosis, but there are various questionnaires that a caregiver may use to identify those with the baby blues, postpartum depression, or psychosis. Often times, a screening tool called the [place] Postnatal Depression Scale is used to diagnose depression in the postpartum period.  TREATMENT The baby blues usually goes away on its own in 1 to 2 weeks. Social support is often all that is needed. You should be encouraged to get adequate sleep and rest. Occasionally, you may be given medicines to help you sleep.  Postpartum  depression requires treatment as it can last several months or longer if it is not treated. Treatment may include individual or group therapy, medicine, or both to address any social, physiological, and psychological factors that may play a role in the depression. Regular exercise, a healthy diet, rest, and social support may also be strongly recommended.  Postpartum psychosis is more serious and  needs treatment right away. Hospitalization is often needed. HOME CARE INSTRUCTIONS  Get as much rest as you can. Nap when the baby sleeps.  Exercise regularly. Some women find yoga and walking to be beneficial.  Eat a balanced and nourishing diet.  Do little things that you enjoy. Have a cup of tea, take a bubble bath, read your favorite magazine, or listen to your favorite music.  Avoid alcohol.  Ask for help with household chores, cooking, grocery shopping, or running errands as needed. Do not try to do everything.  Talk to people close to you about how you are feeling. Get support from your partner, family members, friends, or other new moms.  Try to stay positive in how you think. Think about the things you are grateful for.  Do not spend a lot of time alone.  Only take medicine as directed by your caregiver.  Keep all your postpartum appointments.  Let your caregiver know if you have any concerns. SEEK MEDICAL CARE IF: You are having a reaction or problems with your medicine. SEEK IMMEDIATE MEDICAL CARE IF:  You have suicidal feelings.  You feel you may harm the baby or someone else. Document Released: 10/15/2003 Document Revised: 04/04/2011 Document Reviewed: 10/22/2012 [facility] Patient Information 2014 [place].  Iron-Rich Diet An iron-rich diet contains foods that are good sources of iron. Iron is an important mineral that helps your body produce hemoglobin. Hemoglobin is a protein in red blood cells that carries oxygen to the body's tissues. Sometimes, the iron level in your blood can be low. This may be caused by:  A lack of iron in your diet.  Blood loss.  Times of growth, such as during pregnancy or during a child's growth and development. Low levels of iron can cause a decrease in the number of red blood cells. This can result in iron deficiency anemia. Iron deficiency anemia symptoms include:  Tiredness.  Weakness.  Irritability.  Increased  chance of infection. Here are some recommendations for daily iron intake:  Males older than 20 years of age need 8 mg of iron per day.  Women ages 5919 to 3150 need 18 mg of iron per day.  Pregnant women need 27 mg of iron per day, and women who are over 219 years of age and breastfeeding need 9 mg of iron per day.  Women over the age of 20 need 8 mg of iron per day. SOURCES OF IRON There are 2 types of iron that are found in food: heme iron and nonheme iron. Heme iron is absorbed by the body better than nonheme iron. Heme iron is found in meat, poultry, and fish. Nonheme iron is found in grains, beans, and vegetables. Heme Iron Sources Food / Iron (mg)  Chicken liver, 3 oz (85 g)/ 10 mg  Beef liver, 3 oz (85 g)/ 5.5 mg  Oysters, 3 oz (85 g)/ 8 mg  Beef, 3 oz (85 g)/ 2 to 3 mg  Shrimp, 3 oz (85 g)/ 2.8 mg  [place], 3 oz (85 g)/ 2 mg  Chicken, 3 oz (85 g) / 1 mg  Fish (tuna, halibut),  3 oz (85 g)/ 1 mg  Pork, 3 oz (85 g)/ 0.9 mg Nonheme Iron Sources Food / Iron (mg)  Ready-to-eat breakfast cereal, iron-fortified / 3.9 to 7 mg  Tofu,  cup / 3.4 mg  Kidney beans,  cup / 2.6 mg  Baked potato with skin / 2.7 mg  Asparagus,  cup / 2.2 mg  Avocado / 2 mg  Dried peaches,  cup / 1.6 mg  Raisins,  cup / 1.5 mg  Soy milk, 1 cup / 1.5 mg  Whole-wheat bread, 1 slice / 1.2 mg  Spinach, 1 cup / 0.8 mg  Broccoli,  cup / 0.6 mg IRON ABSORPTION Certain foods can decrease the body's absorption of iron. Try to avoid these foods and beverages while eating meals with iron-containing foods:  Coffee.  Tea.  Fiber.  Soy. Foods containing vitamin C can help increase the amount of iron your body absorbs from iron sources, especially from nonheme sources. Eat foods with vitamin C along with iron-containing foods to increase your iron absorption. Foods that are high in vitamin C include many fruits and vegetables. Some good sources are:  Fresh orange  juice.  Oranges.  Strawberries.  Mangoes.  Grapefruit.  Red bell peppers.  Green bell peppers.  Broccoli.  Potatoes with skin.  Tomato juice. Document Released: 08/24/2004 Document Revised: 04/04/2011 Document Reviewed: 07/01/2010 [facility] Patient Information 2014 [place], [place].

## 2013-03-16 NOTE — Discharge Summary (Signed)
Vaginal Delivery Discharge Summary  Debbie Mercer  DOB:    1996/01/06 MRN:    [id number] CSN:    [id number]  Date of admission:                  03/13/13  Date of discharge:                   03/16/13   Procedures this admission: SVD, epidural,   Date of Delivery: 03/13/13 by Dr Normand Sloopillard   Newborn Data:  Live born female  Birth Weight: 8 lb 1.3 oz (3665 g) APGAR: 9, 9  Home with mother.  Circumcision Plan: outpatient   History of Present Illness:  Debbie Mercer is a 18 y.o. female, G1P1001, who presents at 5690w6d weeks gestation. [facility] She was admitted onset of labor. Her pregnancy has been complicated by: none.  Hospital course:  The patient was admitted for active labor.   Her labor was complicated by protracted transition phase. She proceeded to have a vaginal delivery of a healthy infant. Her delivery was not complicated. Her postpartum course was not complicated.  She was discharged to home on postpartum day 2 doing well.  Feeding:  breast  Contraception:  Depo-Provera - pt desires to receive at 6wks PP   Discharge hemoglobin:  Hemoglobin  Date Value Ref Range Status  03/15/2013 10.3* 12.0 - 15.0 g/dL Final     DELTA CHECK NOTED     REPEATED TO VERIFY     HCT  Date Value Ref Range Status  03/15/2013 31.3* 36.0 - 46.0 % Final    Discharge Physical Exam:   General: alert and no distress Lochia: appropriate Uterine Fundus: firm Incision: healing well DVT Evaluation: No evidence of DVT seen on physical exam. Negative Homan's sign. No significant calf/ankle edema.  Intrapartum Procedures: spontaneous vaginal delivery and epidural  Postpartum Procedures: none Complications-Operative and Postpartum: 2nd degree perineal laceration  Discharge Diagnoses: Term Pregnancy-delivered  Discharge Information:  Activity:           pelvic  rest Diet:                routine Medications: PNV Condition:      stable Instructions:   Postpartum Care After Vaginal Delivery  After you deliver your newborn (postpartum period), the usual stay in the hospital is 24 72 hours. If there were problems with your labor or delivery, or if you have other medical problems, you might be in the hospital longer.  While you are in the hospital, you will receive help and instructions on how to care for yourself and your newborn during the postpartum period.  While you are in the hospital:  Be sure to tell your nurses if you have pain or discomfort, as well as where you feel the pain and what makes the pain worse.  If you had an incision made near your vagina (episiotomy) or if you had some tearing during delivery, the nurses may put ice packs on your episiotomy or tear. The ice packs may help to reduce the pain and swelling.  If you are breastfeeding, you may feel uncomfortable contractions of your uterus for a couple of weeks. This is normal. The contractions help your uterus get back to normal size.  It is normal to have some bleeding after delivery.  For the first 1 3 days after delivery, the flow is  red and the amount may be similar to a period.  It is common for the flow to start and stop.  In the first few days, you may pass some small clots. Let your nurses know if you begin to pass large clots or your flow increases.  Do not  flush blood clots down the toilet before having the nurse look at them.  During the next 3 10 days after delivery, your flow should become more watery and pink or brown-tinged in color.  Ten to fourteen days after delivery, your flow should be a small amount of yellowish-white discharge.  The amount of your flow will decrease over the first few weeks after delivery. Your flow may stop in 6 8 weeks. Most women have had their flow stop by 12 weeks after delivery.  You should change your sanitary pads  frequently.  Wash your hands thoroughly with soap and water for at least 20 seconds after changing pads, using the toilet, or before holding or feeding your newborn.  You should feel like you need to empty your bladder within the first 6 8 hours after delivery.  In case you become weak, lightheaded, or faint, call your nurse before you get out of bed for the first time and before you take a shower for the first time.  Within the first few days after delivery, your breasts may begin to feel tender and full. This is called engorgement. Breast tenderness usually goes away within 48 72 hours after engorgement occurs. You may also notice milk leaking from your breasts. If you are not breastfeeding, do not stimulate your breasts. Breast stimulation can make your breasts produce more milk.  Spending as much time as possible with your newborn is very important. During this time, you and your newborn can feel close and get to know each other. Having your newborn stay in your room (rooming in) will help to strengthen the bond with your newborn. It will give you time to get to know your newborn and become comfortable caring for your newborn.  Your hormones change after delivery. Sometimes the hormone changes can temporarily cause you to feel sad or tearful. These feelings should not last more than a few days. If these feelings last longer than that, you should talk to your caregiver.  If desired, talk to your caregiver about methods of family planning or contraception.  Talk to your caregiver about immunizations. Your caregiver may want you to have the following immunizations before leaving the hospital:  Tetanus, diphtheria, and pertussis (Tdap) or tetanus and diphtheria (Td) immunization. It is very important that you and your family (including grandparents) or others caring for your newborn are up-to-date with the Tdap or Td immunizations. The Tdap or Td immunization can help protect your newborn from  getting ill.  Rubella immunization.  Varicella (chickenpox) immunization.  Influenza immunization. You should receive this annual immunization if you did not receive the immunization during your pregnancy. Document Released: 11/07/2006 Document Revised: 10/05/2011 Document Reviewed: 09/07/2011 [facility] Patient Information 2014 [place], [place].   Postpartum Depression and Baby Blues  The postpartum period begins right after the birth of a baby. During this time, there is often a great amount of joy and excitement. It is also a time of considerable changes in the life of the parent(s). Regardless of how many times a mother gives birth, each child brings new challenges and dynamics to the family. It is not unusual to have feelings of excitement accompanied by confusing shifts in moods,  emotions, and thoughts. All mothers are at risk of developing postpartum depression or the "baby blues." These mood changes can occur right after giving birth, or they may occur many months after giving birth. The baby blues or postpartum depression can be mild or severe. Additionally, postpartum depression can resolve rather quickly, or it can be a long-term condition. CAUSES Elevated hormones and their rapid decline are thought to be a main cause of postpartum depression and the baby blues. There are a number of hormones that radically change during and after pregnancy. Estrogen and progesterone usually decrease immediately after delivering your baby. The level of thyroid hormone and various cortisol steroids also rapidly drop. Other factors that play a major role in these changes include major life events and genetics.  RISK FACTORS If you have any of the following risks for the baby blues or postpartum depression, know what symptoms to watch out for during the postpartum period. Risk factors that may increase the likelihood of getting the baby blues or postpartum depression include:  Havinga personal or family  history of depression.  Having depression while being pregnant.  Having premenstrual or oral contraceptive-associated mood issues.  Having exceptional life stress.  Having marital conflict.  Lacking a social support network.  Having a baby with special needs.  Having health problems such as diabetes. SYMPTOMS Baby blues symptoms include:  Brief fluctuations in mood, such as going from extreme happiness to sadness.  Decreased concentration.  Difficulty sleeping.  Crying spells, tearfulness.  Irritability.  Anxiety. Postpartum depression symptoms typically begin within the first month after giving birth. These symptoms include:  Difficulty sleeping or excessive sleepiness.  Marked weight loss.  Agitation.  Feelings of worthlessness.  Lack of interest in activity or food. Postpartum psychosis is a very concerning condition and can be dangerous. Fortunately, it is rare. Displaying any of the following symptoms is cause for immediate medical attention. Postpartum psychosis symptoms include:  Hallucinations and delusions.  Bizarre or disorganized behavior.  Confusion or disorientation. DIAGNOSIS  A diagnosis is made by an evaluation of your symptoms. There are no medical or lab tests that lead to a diagnosis, but there are various questionnaires that a caregiver may use to identify those with the baby blues, postpartum depression, or psychosis. Often times, a screening tool called the [place] Postnatal Depression Scale is used to diagnose depression in the postpartum period.  TREATMENT The baby blues usually goes away on its own in 1 to 2 weeks. Social support is often all that is needed. You should be encouraged to get adequate sleep and rest. Occasionally, you may be given medicines to help you sleep.  Postpartum depression requires treatment as it can last several months or longer if it is not treated. Treatment may include individual or group therapy, medicine, or  both to address any social, physiological, and psychological factors that may play a role in the depression. Regular exercise, a healthy diet, rest, and social support may also be strongly recommended.  Postpartum psychosis is more serious and needs treatment right away. Hospitalization is often needed. HOME CARE INSTRUCTIONS  Get as much rest as you can. Nap when the baby sleeps.  Exercise regularly. Some women find yoga and walking to be beneficial.  Eat a balanced and nourishing diet.  Do little things that you enjoy. Have a cup of tea, take a bubble bath, read your favorite magazine, or listen to your favorite music.  Avoid alcohol.  Ask for help with household chores, cooking, grocery shopping,  or running errands as needed. Do not try to do everything.  Talk to people close to you about how you are feeling. Get support from your partner, family members, friends, or other new moms.  Try to stay positive in how you think. Think about the things you are grateful for.  Do not spend a lot of time alone.  Only take medicine as directed by your caregiver.  Keep all your postpartum appointments.  Let your caregiver know if you have any concerns. SEEK MEDICAL CARE IF: You are having a reaction or problems with your medicine. SEEK IMMEDIATE MEDICAL CARE IF:  You have suicidal feelings.  You feel you may harm the baby or someone else. Document Released: 10/15/2003 Document Revised: 04/04/2011 Document Reviewed: 11/16/2010 [facility] Patient Information 2014 [place], [place].   Discharge to: home  Follow-up Information   Follow up with [facility] In 6 weeks.   Specialty:  Obstetrics and Gynecology   Contact information:   [address] [place] [address] [phone]       Malissa Hippo 03/16/2013

## 2013-03-17 NOTE — Lactation Note (Signed)
Lactation Consultation Note Follow up consult:  Mother came to MAU requesting a breast pump, she said she was told she would get one before leaving. Mother is engorged and previous note states she had switched to formula.  Mother's milk has now transitioned. Mother states she wants to breastfeed now. Reviewed engorgement care with mother and provided her with a manual breast pump.  Encouraged she put baby to the breast 8-12 times a day or pump to establish her milk supply.  Suggested she come in for a one on one appointment but she said she would come to support group instead.   Patient Name: Dimas Aguasalen R Stankowski JXBJY'NToday's Date: 03/17/2013     Maternal Data    Feeding    LATCH Score/Interventions                      Lactation Tools Discussed/Used     Consult Status      Dahlia ByesBerkelhammer, Zyair Russi [facility] 03/17/2013, 10:56 AM

## 2013-03-18 ENCOUNTER — Inpatient Hospital Stay (HOSPITAL_COMMUNITY)
Admission: AD | Admit: 2013-03-18 | Discharge: 2013-03-21 | DRG: 776 | Disposition: A | Discharge status: Home / Self Care | Payer: BC Managed Care – PPO | Source: Ambulatory Visit | Attending: Obstetrics and Gynecology | Admitting: Obstetrics and Gynecology

## 2013-03-18 ENCOUNTER — Encounter (HOSPITAL_COMMUNITY): Payer: Self-pay

## 2013-03-18 DIAGNOSIS — N10 Acute tubulo-interstitial nephritis: Secondary | ICD-10-CM

## 2013-03-18 DIAGNOSIS — A498 Other bacterial infections of unspecified site: Secondary | ICD-10-CM | POA: Diagnosis present

## 2013-03-18 DIAGNOSIS — R51 Headache: Secondary | ICD-10-CM | POA: Diagnosis present

## 2013-03-18 DIAGNOSIS — O2390 Unspecified genitourinary tract infection in pregnancy, unspecified trimester: Principal | ICD-10-CM | POA: Diagnosis present

## 2013-03-18 DIAGNOSIS — O864 Pyrexia of unknown origin following delivery: Secondary | ICD-10-CM | POA: Diagnosis present

## 2013-03-18 DIAGNOSIS — R509 Fever, unspecified: Secondary | ICD-10-CM | POA: Diagnosis present

## 2013-03-18 LAB — COMPREHENSIVE METABOLIC PANEL
ALT: 24 U/L (ref 0–35)
AST: 27 U/L (ref 0–37)
Albumin: 2.4 g/dL — ABNORMAL LOW (ref 3.5–5.2)
Alkaline Phosphatase: 123 U/L — ABNORMAL HIGH (ref 39–117)
BUN: 10 mg/dL (ref 6–23)
CO2: 24 meq/L (ref 19–32)
Calcium: 8.3 mg/dL — ABNORMAL LOW (ref 8.4–10.5)
Chloride: 103 mEq/L (ref 96–112)
Creatinine, Ser: 0.69 mg/dL (ref 0.50–1.10)
GLUCOSE: 86 mg/dL (ref 70–99)
Potassium: 3.9 mEq/L (ref 3.7–5.3)
Sodium: 139 mEq/L (ref 137–147)
Total Bilirubin: 0.9 mg/dL (ref 0.3–1.2)
Total Protein: 5.9 g/dL — ABNORMAL LOW (ref 6.0–8.3)

## 2013-03-18 LAB — URINALYSIS, ROUTINE W REFLEX MICROSCOPIC
Bilirubin Urine: NEGATIVE
GLUCOSE, UA: NEGATIVE mg/dL
KETONES UR: NEGATIVE mg/dL
Nitrite: POSITIVE — AB
PROTEIN: NEGATIVE mg/dL
Specific Gravity, Urine: 1.01 (ref 1.005–1.030)
UROBILINOGEN UA: 1 mg/dL (ref 0.0–1.0)
pH: 7 (ref 5.0–8.0)

## 2013-03-18 LAB — PROTEIN / CREATININE RATIO, URINE
CREATININE, URINE: 52.49 mg/dL
Protein Creatinine Ratio: 0.29 — ABNORMAL HIGH (ref 0.00–0.15)
TOTAL PROTEIN, URINE: 15.2 mg/dL

## 2013-03-18 LAB — CBC WITH DIFFERENTIAL/PLATELET
Basophils Absolute: 0 10*3/uL (ref 0.0–0.1)
Basophils Relative: 0 % (ref 0–1)
Eosinophils Absolute: 0.1 10*3/uL (ref 0.0–0.7)
Eosinophils Relative: 1 % (ref 0–5)
HEMATOCRIT: 32.6 % — AB (ref 36.0–46.0)
HEMOGLOBIN: 10.6 g/dL — AB (ref 12.0–15.0)
LYMPHS ABS: 1 10*3/uL (ref 0.7–4.0)
LYMPHS PCT: 7 % — AB (ref 12–46)
MCH: 23.9 pg — ABNORMAL LOW (ref 26.0–34.0)
MCHC: 32.5 g/dL (ref 30.0–36.0)
MCV: 73.4 fL — AB (ref 78.0–100.0)
MONO ABS: 1.1 10*3/uL — AB (ref 0.1–1.0)
Monocytes Relative: 8 % (ref 3–12)
Neutro Abs: 12.1 10*3/uL — ABNORMAL HIGH (ref 1.7–7.7)
Neutrophils Relative %: 84 % — ABNORMAL HIGH (ref 43–77)
Platelets: 286 10*3/uL (ref 150–400)
RBC: 4.44 MIL/uL (ref 3.87–5.11)
RDW: 13.4 % (ref 11.5–15.5)
WBC: 14.4 10*3/uL — AB (ref 4.0–10.5)

## 2013-03-18 LAB — URINE MICROSCOPIC-ADD ON

## 2013-03-18 LAB — URIC ACID: Uric Acid, Serum: 6.5 mg/dL (ref 2.4–7.0)

## 2013-03-18 LAB — LACTATE DEHYDROGENASE: LDH: 338 U/L — AB (ref 94–250)

## 2013-03-18 MED ORDER — ACETAMINOPHEN 500 MG PO TABS
1000.0000 mg | ORAL_TABLET | ORAL | Status: AC
  Administered 2013-03-18: 1000 mg via ORAL
  Filled 2013-03-18: qty 2

## 2013-03-18 MED ORDER — IBUPROFEN 600 MG PO TABS
600.0000 mg | ORAL_TABLET | Freq: Four times a day (QID) | ORAL | Status: DC
  Administered 2013-03-18 – 2013-03-21 (×9): 600 mg via ORAL
  Filled 2013-03-18 (×11): qty 1

## 2013-03-18 MED ORDER — ZOLPIDEM TARTRATE 5 MG PO TABS: 5.0000 mg | ORAL_TABLET | Freq: Every evening | ORAL | Status: DC | PRN

## 2013-03-18 MED ORDER — CEFAZOLIN SODIUM 1-5 GM-% IV SOLN
1.0000 g | Freq: Three times a day (TID) | INTRAVENOUS | Status: DC
  Administered 2013-03-18 – 2013-03-21 (×9): 1 g via INTRAVENOUS
  Filled 2013-03-18 (×10): qty 50

## 2013-03-18 MED ORDER — PRENATAL MULTIVITAMIN CH
1.0000 | ORAL_TABLET | Freq: Every day | ORAL | Status: DC
  Administered 2013-03-19 – 2013-03-20 (×2): 1 via ORAL
  Filled 2013-03-18 (×2): qty 1

## 2013-03-18 MED ORDER — SIMETHICONE 80 MG PO CHEW: 80.0000 mg | CHEWABLE_TABLET | ORAL | Status: DC | PRN

## 2013-03-18 MED ORDER — BENZOCAINE-MENTHOL 20-0.5 % EX AERO: 1.0000 "application " | INHALATION_SPRAY | CUTANEOUS | Status: DC | PRN

## 2013-03-18 MED ORDER — OXYCODONE-ACETAMINOPHEN 5-325 MG PO TABS: 1.0000 | ORAL_TABLET | ORAL | Status: DC | PRN

## 2013-03-18 MED ORDER — SENNOSIDES-DOCUSATE SODIUM 8.6-50 MG PO TABS
2.0000 | ORAL_TABLET | ORAL | Status: DC
  Administered 2013-03-18 – 2013-03-20 (×3): 2 via ORAL
  Filled 2013-03-18 (×3): qty 2

## 2013-03-18 MED ORDER — TETANUS-DIPHTH-ACELL PERTUSSIS 5-2.5-18.5 LF-MCG/0.5 IM SUSP
0.5000 mL | Freq: Once | INTRAMUSCULAR | Status: DC
Start: 2013-03-19 — End: 2013-03-21

## 2013-03-18 MED ORDER — ONDANSETRON HCL 4 MG/2ML IJ SOLN: 4.0000 mg | INTRAMUSCULAR | Status: DC | PRN

## 2013-03-18 MED ORDER — LANOLIN HYDROUS EX OINT: TOPICAL_OINTMENT | CUTANEOUS | Status: DC | PRN

## 2013-03-18 MED ORDER — LACTATED RINGERS IV SOLN
INTRAVENOUS | Status: DC
  Administered 2013-03-19 – 2013-03-21 (×5): via INTRAVENOUS

## 2013-03-18 MED ORDER — LACTATED RINGERS IV SOLN
INTRAVENOUS | Status: DC
  Administered 2013-03-18: 10:00:00 via INTRAVENOUS

## 2013-03-18 MED ORDER — ONDANSETRON HCL 4 MG PO TABS: 4.0000 mg | ORAL_TABLET | ORAL | Status: DC | PRN

## 2013-03-18 MED ORDER — DIBUCAINE 1 % RE OINT: 1.0000 "application " | TOPICAL_OINTMENT | RECTAL | Status: DC | PRN

## 2013-03-18 MED ORDER — WITCH HAZEL-GLYCERIN EX PADS: 1.0000 "application " | MEDICATED_PAD | CUTANEOUS | Status: DC | PRN

## 2013-03-18 MED ORDER — DIPHENHYDRAMINE HCL 25 MG PO CAPS: 25.0000 mg | ORAL_CAPSULE | Freq: Four times a day (QID) | ORAL | Status: DC | PRN

## 2013-03-18 NOTE — MAU Provider Note (Addendum)
  History     CSN: [id number]  Arrival date and time: 03/18/13 [id number]   First Provider Initiated Contact with Patient 03/18/13 1116      Chief Complaint  Patient presents with  . Postpartum Complications   HPI Pt presents c/o waking up this morning thinking she was having a seizure and was brought in by EMS.  The patient per report did not seem postictal but had a temp to 103.  She states she has a HA at the base of there head and low back pain on the left but no other sxs.  She denies dysuria except for when the urine hits the laceration.    OB History   Grav Para Term Preterm Abortions TAB SAB Ect Mult Living   1 1 1       1       Past Medical History  Diagnosis Date  . Medical history non-contributory     Past Surgical History  Procedure Laterality Date  . Breast lumpectomy      Family History  Problem Relation Age of Onset  . Hypertension Mother   . Diabetes Maternal Grandmother     History  Substance Use Topics  . Smoking status: Never Smoker   . Smokeless tobacco: Never Used  . Alcohol Use: No    Allergies:  Allergies  Allergen Reactions  . Shellfish Allergy Anaphylaxis    Prescriptions prior to admission  Medication Sig Dispense Refill  . Prenatal Vit-Fe Fumarate-FA (PRENATAL MULTIVITAMIN) TABS tablet Take 1 tablet by mouth daily at 12 noon.      Melene Muller. [START ON 04/06/2013] norethindrone (ORTHO MICRONOR) 0.35 MG tablet Take 1 tablet (0.35 mg total) by mouth daily.  1 Package  11    ROS Denies N/V/D  Physical Exam   Blood pressure 135/69, pulse 118, temperature 103 F (39.4 C), temperature source Oral, resp. rate 20, last menstrual period 06/02/2012, SpO2 98.00%, currently breastfeeding.  Physical Exam  Lungs CTA CV RRR Breasts NT, not engorged Abd soft, NT, FF, NABS Ext no calf tenderness Left low back pain, no bilateral CVAT Spec - unable to visualize cervix secondary to discomfort from laceration repair, small amount of bleed in vault VE -  unable to feel cervix for sam reason as above but no adnexal or uterine tenderness  MAU Course  Procedures  CBC w/diff CMET LDH Uric Acid Blood Cxs x 2 UCx UA  Assessment and Plan  P1 s/p NSVD with FUO.  Differential pyelonephritis secondary to pain in left low back although not in typical location of true flank pain and ucx is pending.  The other is possible meningitis considering the patients HA although I would suspect some more sxs with a more diffuse headache.  Will admit for observation for now while labs are pending.  Pt is currently afebrile s/p tylenol.  Will likely start antibiotics empirically if pt spikes another temp.  Kolleen Ochsner Y 03/18/2013, 11:29 AM   Addendum UA + nitrites and many bacteria, ucx pending.  Will treat for pyelonephritis with cefazolin 1g IV q8hrs.  WBC 14 with 84% neutrophils and hgb 10.  GHTN labs wnl.  Pt continues to be afebrile at 4p.

## 2013-03-18 NOTE — MAU Note (Signed)
Debbie Housemananya, RN, charge given pt report. Pt to go to room 305.

## 2013-03-18 NOTE — MAU Note (Signed)
Pt in via EMS for lower back pain, left flank pain. Had stitches and has had burning where urine touches stitches, otherwise no uti s/s. Also having pain at base of skull and at epidural site.

## 2013-03-18 NOTE — Progress Notes (Signed)
Notified of pt in MAU via EMS. Temp 103, was shaky, NSVD Thursday, orders obtained, will come see pt

## 2013-03-19 LAB — CBC WITH DIFFERENTIAL/PLATELET
BASOS ABS: 0 10*3/uL (ref 0.0–0.1)
Basophils Relative: 0 % (ref 0–1)
EOS ABS: 0 10*3/uL (ref 0.0–0.7)
Eosinophils Relative: 0 % (ref 0–5)
HCT: 33.6 % — ABNORMAL LOW (ref 36.0–46.0)
Hemoglobin: 10.8 g/dL — ABNORMAL LOW (ref 12.0–15.0)
LYMPHS ABS: 0.7 10*3/uL (ref 0.7–4.0)
Lymphocytes Relative: 7 % — ABNORMAL LOW (ref 12–46)
MCH: 23.9 pg — ABNORMAL LOW (ref 26.0–34.0)
MCHC: 32.1 g/dL (ref 30.0–36.0)
MCV: 74.5 fL — AB (ref 78.0–100.0)
MONOS PCT: 11 % (ref 3–12)
Monocytes Absolute: 1 10*3/uL (ref 0.1–1.0)
Neutro Abs: 7.8 10*3/uL — ABNORMAL HIGH (ref 1.7–7.7)
Neutrophils Relative %: 82 % — ABNORMAL HIGH (ref 43–77)
PLATELETS: 265 10*3/uL (ref 150–400)
RBC: 4.51 MIL/uL (ref 3.87–5.11)
RDW: 13.5 % (ref 11.5–15.5)
WBC: 9.5 10*3/uL (ref 4.0–10.5)

## 2013-03-19 NOTE — Progress Notes (Signed)
Post Partum Day 5 readmitted for FUO: acute pyelonephritis with bacteriemia Subjective:  Feeling much better than yesterday. Lochia are normal. Voiding, ambulating, tolerating normal diet. Breast pumping Good appetite LLQ back pain is more intermittent  Objective: Blood pressure 110/70, pulse 74, temperature 99.5 F (37.5 C), temperature source Oral, resp. rate 18, height 5\' 3"  (1.6 m), weight 193 lb 4 oz (87.658 kg), last menstrual period 06/02/2012, SpO2 100.00%, currently breastfeeding.  Physical Exam:  General: normal Lochia: appropriate Uterine Fundus: 0/3 firm non-tender No CVAT Extremities: No evidence of DVT seen on physical exam.      Recent Labs  03/18/13 0935  HGB 10.6*  HCT 32.6*    Assessment/Plan: Acute pyelonephritis with bacteriemia to E Coli responding well to Ancef: patient has been afebrile since admission Spoke with dr Judyann Munsonynthia Snider with Infectious Disease [phone]) who recommends to continue with Ancef until Sensitivities are back to appropriately convert to PO for 10 days. Also suggest to repeat blood cultures today. Anticipate D/C in am  LOS: 1 day   Harmoney Sienkiewicz A MD 03/19/2013, 1:55 PM

## 2013-03-19 NOTE — Progress Notes (Signed)
Post discharge chart review completed.  

## 2013-03-20 LAB — CBC WITH DIFFERENTIAL/PLATELET
Basophils Absolute: 0.1 10*3/uL (ref 0.0–0.1)
Basophils Relative: 1 % (ref 0–1)
EOS PCT: 0 % (ref 0–5)
Eosinophils Absolute: 0 10*3/uL (ref 0.0–0.7)
HCT: 33.9 % — ABNORMAL LOW (ref 36.0–46.0)
Hemoglobin: 11.1 g/dL — ABNORMAL LOW (ref 12.0–15.0)
Lymphocytes Relative: 12 % (ref 12–46)
Lymphs Abs: 1.4 10*3/uL (ref 0.7–4.0)
MCH: 24.2 pg — ABNORMAL LOW (ref 26.0–34.0)
MCHC: 32.7 g/dL (ref 30.0–36.0)
MCV: 73.9 fL — ABNORMAL LOW (ref 78.0–100.0)
Monocytes Absolute: 2.2 10*3/uL — ABNORMAL HIGH (ref 0.1–1.0)
Monocytes Relative: 19 % — ABNORMAL HIGH (ref 3–12)
NEUTROS PCT: 68 % (ref 43–77)
Neutro Abs: 8.1 10*3/uL — ABNORMAL HIGH (ref 1.7–7.7)
PLATELETS: 295 10*3/uL (ref 150–400)
RBC: 4.59 MIL/uL (ref 3.87–5.11)
RDW: 13.6 % (ref 11.5–15.5)
WBC: 11.8 10*3/uL — ABNORMAL HIGH (ref 4.0–10.5)

## 2013-03-20 LAB — URINE CULTURE: Colony Count: >=100000

## 2013-03-20 MED ORDER — ACETAMINOPHEN 325 MG PO TABS
650.0000 mg | ORAL_TABLET | Freq: Once | ORAL | Status: AC
  Administered 2013-03-20: 650 mg via ORAL
  Filled 2013-03-20: qty 2

## 2013-03-20 NOTE — Progress Notes (Signed)
[name] is a 18 y.o. year old female. She was admitted on 03/18/2013 with a presumptive diagnosis of pyelonephritis. She was started on IV Ancef. She had a vaginal delivery on 03/13/2013.  Subjective:  The patient reports that she feels better. She believes that her recent fever came from her full breast. She no longer has a back pain.  Objective:  Temp was 100.9 at 6:30 this morning.  BP 109/57  Pulse 75  Temp(Src) 98.5 F (36.9 C) (Oral)  Resp 17  Ht 5\' 3"  (1.6 m)  Wt 193 lb 4 oz (87.658 kg)  BMI 34.24 kg/m2  SpO2 99%  LMP 06/02/2012  Breastfeeding? Yes   CBC    Component Value Date/Time   WBC 9.5 03/19/2013 1413   RBC 4.51 03/19/2013 1413   HGB 10.8* 03/19/2013 1413   HCT 33.6* 03/19/2013 1413   PLT 265 03/19/2013 1413   MCV 74.5* 03/19/2013 1413   MCH 23.9* 03/19/2013 1413   MCHC 32.1 03/19/2013 1413   RDW 13.5 03/19/2013 1413   LYMPHSABS 0.7 03/19/2013 1413   MONOABS 1.0 03/19/2013 1413   EOSABS 0.0 03/19/2013 1413   BASOSABS 0.0 03/19/2013 1413     Chest: Clear Heart: Regular rate and rhythm Abdomen: Nontender Back: No CVA tenderness  Assessment:  Pyelonephritis Status post vaginal delivery on 03/13/2013 Recent temperature this morning The patient strongly desires to go home  Plan:  CBC with a differential If the patient remains afebrile this afternoon, then I will consider discharging the patient after an IV dose of Rocephin. The patient will return to the hospital tomorrow to get a second dose of Rocephin. She will measure her temperature every 4 hours at home. We will then switch the patient to an oral medication.  Leonard SchwartzArthur Vernon Makye Radle M.D.  03/20/13  11:49 AM

## 2013-03-20 NOTE — Progress Notes (Signed)
Ur chart review completed.  

## 2013-03-20 NOTE — Progress Notes (Signed)
The patient reports that she feels fine.  BP 123/80  Pulse 90  Temp(Src) 98.2 F (36.8 C) (Oral)  Resp 20  Ht 5\' 3"  (1.6 m)  Wt 193 lb 4 oz (87.658 kg)  BMI 34.24 kg/m2  SpO2 100%  LMP 06/02/2012  Breastfeeding? Yes  Results for orders placed during the hospital encounter of 03/18/13 (from the past 24 hour(s))  CBC WITH DIFFERENTIAL     Status: Abnormal   Collection Time    03/20/13 12:40 PM      Result Value Ref Range   WBC 11.8 (*) 4.0 - 10.5 K/uL   RBC 4.59  3.87 - 5.11 MIL/uL   Hemoglobin 11.1 (*) 12.0 - 15.0 g/dL   HCT 16.133.9 (*) 09.636.0 - 04.546.0 %   MCV 73.9 (*) 78.0 - 100.0 fL   MCH 24.2 (*) 26.0 - 34.0 pg   MCHC 32.7  30.0 - 36.0 g/dL   RDW 40.913.6  81.111.5 - 91.415.5 %   Platelets 295  150 - 400 K/uL   Neutrophils Relative % 68  43 - 77 %   Lymphocytes Relative 12  12 - 46 %   Monocytes Relative 19 (*) 3 - 12 %   Eosinophils Relative 0  0 - 5 %   Basophils Relative 1  0 - 1 %   Neutro Abs 8.1 (*) 1.7 - 7.7 K/uL   Lymphs Abs 1.4  0.7 - 4.0 K/uL   Monocytes Absolute 2.2 (*) 0.1 - 1.0 K/uL   Eosinophils Absolute 0.0  0.0 - 0.7 K/uL   Basophils Absolute 0.1  0.0 - 0.1 K/uL   Smear Review MORPHOLOGY UNREMARKABLE      Abdomen: Soft and nontender Back: No CVA tenderness  Assessment:  The patient now agrees to remain in hospital overnight because of her elevated temperature to 100.9 this morning.  Plan:  Repeat CBC in the morning. Continue IV antibiotics for tonight. Discharge to home tomorrow if she has been afebrile for greater than 24 hours.  Mylinda LatinaArthur Vernon Efrat Zuidema M.D.

## 2013-03-20 NOTE — Progress Notes (Signed)
Post Partum Day 6: S/P SVD - Readmitted for pyelonepritis  Subjective: Called by RN for temp of 100.9 at 0615 despite taking Motrin at 0512  Objective: Blood pressure 134/81, pulse 94, temperature 100.9 F (38.3 C), temperature source Oral, resp. rate 18, height 5\' 3"  (1.6 m), weight 193 lb 4 oz (87.658 kg), last menstrual period 06/02/2012, SpO2 97.00%, currently breastfeeding.    Assessment/Plan: S/P Vaginal delivery day 6 Pyelonephritis  Tylenol 650 mg Once and re-check temp in 30 min      LOS: 2 days   Debbie Mercer 03/20/2013, 6:40 AM

## 2013-03-21 LAB — CBC WITH DIFFERENTIAL/PLATELET
BAND NEUTROPHILS: 0 % (ref 0–10)
BASOS PCT: 0 % (ref 0–1)
BLASTS: 0 %
Basophils Absolute: 0 10*3/uL (ref 0.0–0.1)
Eosinophils Absolute: 0 10*3/uL (ref 0.0–0.7)
Eosinophils Relative: 0 % (ref 0–5)
HEMATOCRIT: 31.5 % — AB (ref 36.0–46.0)
HEMOGLOBIN: 10.4 g/dL — AB (ref 12.0–15.0)
LYMPHS ABS: 4.2 10*3/uL — AB (ref 0.7–4.0)
LYMPHS PCT: 40 % (ref 12–46)
MCH: 24.4 pg — ABNORMAL LOW (ref 26.0–34.0)
MCHC: 33 g/dL (ref 30.0–36.0)
MCV: 73.9 fL — ABNORMAL LOW (ref 78.0–100.0)
MONOS PCT: 7 % (ref 3–12)
Metamyelocytes Relative: 0 %
Monocytes Absolute: 0.7 10*3/uL (ref 0.1–1.0)
Myelocytes: 0 %
NEUTROS ABS: 5.7 10*3/uL (ref 1.7–7.7)
Neutrophils Relative %: 53 % (ref 43–77)
Platelets: 317 10*3/uL (ref 150–400)
Promyelocytes Absolute: 0 %
RBC: 4.26 MIL/uL (ref 3.87–5.11)
RDW: 13.5 % (ref 11.5–15.5)
WBC: 10.6 10*3/uL — AB (ref 4.0–10.5)
nRBC: 3 /100 WBC — ABNORMAL HIGH

## 2013-03-21 LAB — CULTURE, BLOOD (ROUTINE X 2)

## 2013-03-21 MED ORDER — CYCLOBENZAPRINE HCL 5 MG PO TABS: 5.0000 mg | ORAL_TABLET | Freq: Three times a day (TID) | ORAL | Status: DC | PRN

## 2013-03-21 MED ORDER — CEPHALEXIN 500 MG PO CAPS: 500.0000 mg | ORAL_CAPSULE | Freq: Four times a day (QID) | ORAL | Status: AC

## 2013-03-21 MED ORDER — OXYCODONE-ACETAMINOPHEN 5-325 MG PO TABS: 1.0000 | ORAL_TABLET | ORAL | Status: DC | PRN

## 2013-03-21 MED ORDER — IBUPROFEN 600 MG PO TABS
600.0000 mg | ORAL_TABLET | Freq: Four times a day (QID) | ORAL | Status: DC
Start: 2013-03-21 — End: 2014-03-31

## 2013-03-21 NOTE — Discharge Instructions (Signed)
Pyelonephritis, Adult °Pyelonephritis is a kidney infection. A kidney infection can happen quickly, or it can last for a long time. °HOME CARE  °· Take your medicine (antibiotics) as told. Finish it even if you start to feel better. °· Keep all doctor visits as told. °· Drink enough fluids to keep your pee (urine) clear or pale yellow. °· Only take medicine as told by your doctor. °GET HELP RIGHT AWAY IF:  °· You have a fever or lasting symptoms for more than 2-3 days. °· You have a fever and your symptoms suddenly get worse. °· You cannot take your medicine or drink fluids as told. °· You have chills and shaking. °· You feel very weak or pass out (faint). °· You do not feel better after 2 days. °MAKE SURE YOU: °· Understand these instructions. °· Will watch your condition. °· Will get help right away if you are not doing well or get worse. °Document Released: 02/18/2004 Document Revised: 07/12/2011 Document Reviewed: 06/30/2010 °ExitCare® Patient Information ©2014 [employer]. ° °

## 2013-03-21 NOTE — Progress Notes (Signed)
Pt  Ambulate   To   Car  No questions asked

## 2013-03-21 NOTE — Discharge Summary (Signed)
Physician Discharge Summary  Patient ID: Debbie Mercer MRN: [id number] DOB/AGE: July 19, 1995 17 y.o.  Admit date: 03/18/2013 Discharge date: 03/21/2013  Admission Diagnoses: s/p SVD with  Fever  Discharge Diagnoses: pyelonephritis Active Problems:   Fever   Discharged Condition: good  Hospital Course: Pt was admitted with a fever of 103.  She became afebrile for greater than 24 hours with Ancef and IVF.  She is breast feeding and her back pain improved.    Consults: None  Significant Diagnostic Studies: microbiology: blood culture: positive for gram negative rods.  urine culture sig for Ecoli  Treatments: IV hydration, antibiotics: Ancef and analgesia: acetaminophen w/ codeine  Discharge Exam: Blood pressure 118/86, pulse 80, temperature 97.5 F (36.4 C), temperature source Oral, resp. rate 18, height 5\' 3"  (1.6 m), weight 193 lb 4 oz (87.658 kg), last menstrual period 06/02/2012, SpO2 100.00%, currently breastfeeding. General appearance: alert and cooperative Resp: clear to auscultation bilaterally Chest wall: no tenderness Cardio: regular rate and rhythm Pelvic: normal lochia Extremities: Homans sign is negative, no sign of DVT  Disposition: 01-Home or Self Care  Discharge Orders   Future Orders Complete By Expires   Discharge patient  As directed        Medication List         cyclobenzaprine 5 MG tablet  Commonly known as:  FLEXERIL  Take 1 tablet (5 mg total) by mouth 3 (three) times daily as needed for muscle spasms.     ibuprofen 600 MG tablet  Commonly known as:  ADVIL,MOTRIN  Take 1 tablet (600 mg total) by mouth every 6 (six) hours.     norethindrone 0.35 MG tablet  Commonly known as:  ORTHO MICRONOR  Take 1 tablet (0.35 mg total) by mouth daily.  Start taking on:  04/06/2013     oxyCODONE-acetaminophen 5-325 MG per tablet  Commonly known as:  PERCOCET/ROXICET  Take 1-2 tablets by mouth every 4 (four) hours as needed for severe pain (moderate -  severe pain).     prenatal multivitamin Tabs tablet  Take 1 tablet by mouth daily at 12 noon.           Follow-up Information   [facility] weeks.   Specialty:  Obstetrics and Gynecology   Contact information:   [address] [place] [id number] [phone]     Do not take flexeril and codeine at the same time Signed: Miia Blanks A 03/21/2013, 11:16 AM

## 2013-03-21 NOTE — Progress Notes (Signed)
18 y.o. year old female, admitted on 03/18/2013 with a presumptive diagnosis of pyelonephritis. She was started on IV Ancef. She had a vaginal delivery on 03/13/2013.   Subjective:  The patient reports that she feels "good and is ready to go home."   Objective: Blood pressure 124/82, pulse 75, temperature 97.5 F (36.4 C), temperature source Oral, resp. rate 18, height 5\' 3"  (1.6 m), weight 193 lb 4 oz (87.658 kg), last menstrual period 06/02/2012, SpO2 100.00%, currently breastfeeding.  No temp in last 24 hours  Physical Exam:  Chest: Clear Heart: RRR Abdomen: NT Extremities: WNL  Results for orders placed during the hospital encounter of 03/18/13 (from the past 24 hour(s))  CBC WITH DIFFERENTIAL     Status: Abnormal   Collection Time    03/20/13 12:40 PM      Result Value Ref Range   WBC 11.8 (*) 4.0 - 10.5 K/uL   RBC 4.59  3.87 - 5.11 MIL/uL   Hemoglobin 11.1 (*) 12.0 - 15.0 g/dL   HCT 69.6 (*) 29.5 - 28.4 %   MCV 73.9 (*) 78.0 - 100.0 fL   MCH 24.2 (*) 26.0 - 34.0 pg   MCHC 32.7  30.0 - 36.0 g/dL   RDW 13.2  44.0 - 10.2 %   Platelets 295  150 - 400 K/uL   Neutrophils Relative % 68  43 - 77 %   Lymphocytes Relative 12  12 - 46 %   Monocytes Relative 19 (*) 3 - 12 %   Eosinophils Relative 0  0 - 5 %   Basophils Relative 1  0 - 1 %   Neutro Abs 8.1 (*) 1.7 - 7.7 K/uL   Lymphs Abs 1.4  0.7 - 4.0 K/uL   Monocytes Absolute 2.2 (*) 0.1 - 1.0 K/uL   Eosinophils Absolute 0.0  0.0 - 0.7 K/uL   Basophils Absolute 0.1  0.0 - 0.1 K/uL   Smear Review MORPHOLOGY UNREMARKABLE    CBC WITH DIFFERENTIAL     Status: Abnormal   Collection Time    03/21/13  5:05 AM      Result Value Ref Range   WBC 10.6 (*) 4.0 - 10.5 K/uL   RBC 4.26  3.87 - 5.11 MIL/uL   Hemoglobin 10.4 (*) 12.0 - 15.0 g/dL   HCT 72.5 (*) 36.6 - 44.0 %   MCV 73.9 (*) 78.0 - 100.0 fL   MCH 24.4 (*) 26.0 - 34.0 pg   MCHC 33.0  30.0 - 36.0 g/dL   RDW 34.7  42.5 - 95.6 %   Platelets 317  150 - 400 K/uL   Neutrophils  Relative % 53  43 - 77 %   Lymphocytes Relative 40  12 - 46 %   Monocytes Relative 7  3 - 12 %   Eosinophils Relative 0  0 - 5 %   Basophils Relative 0  0 - 1 %   Band Neutrophils 0  0 - 10 %   Metamyelocytes Relative 0     Myelocytes 0     Promyelocytes Absolute 0     Blasts 0     nRBC 3 (*) 0 /100 WBC   Neutro Abs 5.7  1.7 - 7.7 K/uL   Lymphs Abs 4.2 (*) 0.7 - 4.0 K/uL   Monocytes Absolute 0.7  0.1 - 1.0 K/uL   Eosinophils Absolute 0.0  0.0 - 0.7 K/uL   Basophils Absolute 0.0  0.0 - 0.1 K/uL   RBC Morphology POLYCHROMASIA PRESENT  Assessment/Plan: Pyelonephritis S/P Vaginal delivery day 8 Continue current care MD to follow Probable discharge home today with PO abx   LOS: 3 days   Debbie Mercer 03/21/2013, 6:44 AM

## 2013-03-24 LAB — CULTURE, BLOOD (ROUTINE X 2): Culture: NO GROWTH

## 2013-03-25 LAB — CULTURE, BLOOD (ROUTINE X 2)
CULTURE: NO GROWTH
Culture: NO GROWTH

## 2013-11-25 ENCOUNTER — Encounter (HOSPITAL_COMMUNITY): Payer: Self-pay

## 2014-03-31 ENCOUNTER — Emergency Department (HOSPITAL_COMMUNITY): Payer: Medicaid Other

## 2014-03-31 ENCOUNTER — Emergency Department (HOSPITAL_COMMUNITY)
Admission: EM | Admit: 2014-03-31 | Discharge: 2014-03-31 | Disposition: A | Discharge status: Home / Self Care | Payer: Medicaid Other | Attending: Emergency Medicine | Admitting: Emergency Medicine

## 2014-03-31 ENCOUNTER — Encounter (HOSPITAL_COMMUNITY): Payer: Self-pay | Admitting: Emergency Medicine

## 2014-03-31 DIAGNOSIS — Z3202 Encounter for pregnancy test, result negative: Secondary | ICD-10-CM | POA: Diagnosis not present

## 2014-03-31 DIAGNOSIS — R112 Nausea with vomiting, unspecified: Secondary | ICD-10-CM | POA: Diagnosis present

## 2014-03-31 DIAGNOSIS — J069 Acute upper respiratory infection, unspecified: Secondary | ICD-10-CM | POA: Insufficient documentation

## 2014-03-31 DIAGNOSIS — Z793 Long term (current) use of hormonal contraceptives: Secondary | ICD-10-CM | POA: Insufficient documentation

## 2014-03-31 DIAGNOSIS — B349 Viral infection, unspecified: Secondary | ICD-10-CM

## 2014-03-31 LAB — URINALYSIS, ROUTINE W REFLEX MICROSCOPIC
GLUCOSE, UA: NEGATIVE mg/dL
KETONES UR: NEGATIVE mg/dL
Nitrite: NEGATIVE
PH: 6 (ref 5.0–8.0)
Protein, ur: 30 mg/dL — AB
SPECIFIC GRAVITY, URINE: 1.023 (ref 1.005–1.030)
Urobilinogen, UA: 1 mg/dL (ref 0.0–1.0)

## 2014-03-31 LAB — COMPREHENSIVE METABOLIC PANEL
ALBUMIN: 4.1 g/dL (ref 3.5–5.2)
ALK PHOS: 170 U/L — AB (ref 39–117)
ALT: 38 U/L — AB (ref 0–35)
AST: 25 U/L (ref 0–37)
Anion gap: 7 (ref 5–15)
BUN: 11 mg/dL (ref 6–23)
CALCIUM: 8.7 mg/dL (ref 8.4–10.5)
CO2: 27 mmol/L (ref 19–32)
Chloride: 106 mmol/L (ref 96–112)
Creatinine, Ser: 0.66 mg/dL (ref 0.50–1.10)
GFR calc Af Amer: >90 mL/min (ref >90)
GFR calc non Af Amer: >90 mL/min (ref >90)
GLUCOSE: 126 mg/dL — AB (ref 70–99)
POTASSIUM: 3.3 mmol/L — AB (ref 3.5–5.1)
Sodium: 140 mmol/L (ref 135–145)
Total Bilirubin: 0.6 mg/dL (ref 0.3–1.2)
Total Protein: 8.7 g/dL — ABNORMAL HIGH (ref 6.0–8.3)

## 2014-03-31 LAB — CBC WITH DIFFERENTIAL/PLATELET
Basophils Absolute: 0.1 10*3/uL (ref 0.0–0.1)
Basophils Relative: 1 % (ref 0–1)
EOS ABS: 0 10*3/uL (ref 0.0–0.7)
Eosinophils Relative: 0 % (ref 0–5)
HCT: 44.4 % (ref 36.0–46.0)
Hemoglobin: 14.1 g/dL (ref 12.0–15.0)
LYMPHS PCT: 6 % — AB (ref 12–46)
Lymphs Abs: 0.7 10*3/uL (ref 0.7–4.0)
MCH: 23.7 pg — ABNORMAL LOW (ref 26.0–34.0)
MCHC: 31.8 g/dL (ref 30.0–36.0)
MCV: 74.6 fL — AB (ref 78.0–100.0)
MONOS PCT: 5 % (ref 3–12)
Monocytes Absolute: 0.6 10*3/uL (ref 0.1–1.0)
NEUTROS ABS: 10 10*3/uL — AB (ref 1.7–7.7)
Neutrophils Relative %: 88 % — ABNORMAL HIGH (ref 43–77)
Platelets: 363 10*3/uL (ref 150–400)
RBC: 5.95 MIL/uL — ABNORMAL HIGH (ref 3.87–5.11)
RDW: 13.3 % (ref 11.5–15.5)
WBC: 11.4 10*3/uL — ABNORMAL HIGH (ref 4.0–10.5)

## 2014-03-31 LAB — POC URINE PREG, ED: Preg Test, Ur: NEGATIVE

## 2014-03-31 LAB — RAPID STREP SCREEN (MED CTR MEBANE ONLY): Streptococcus, Group A Screen (Direct): NEGATIVE

## 2014-03-31 LAB — URINE MICROSCOPIC-ADD ON

## 2014-03-31 MED ORDER — SODIUM CHLORIDE 0.9 % IV BOLUS (SEPSIS)
1000.0000 mL | Freq: Once | INTRAVENOUS | Status: AC
  Administered 2014-03-31: 1000 mL via INTRAVENOUS

## 2014-03-31 MED ORDER — KETOROLAC TROMETHAMINE 30 MG/ML IJ SOLN
30.0000 mg | Freq: Once | INTRAMUSCULAR | Status: AC
  Administered 2014-03-31: 30 mg via INTRAVENOUS
  Filled 2014-03-31: qty 1

## 2014-03-31 MED ORDER — ONDANSETRON HCL 4 MG/2ML IJ SOLN
4.0000 mg | Freq: Once | INTRAMUSCULAR | Status: AC
  Administered 2014-03-31: 4 mg via INTRAVENOUS
  Filled 2014-03-31: qty 2

## 2014-03-31 MED ORDER — IBUPROFEN 600 MG PO TABS: 600.0000 mg | ORAL_TABLET | Freq: Four times a day (QID) | ORAL | Status: DC | PRN

## 2014-03-31 MED ORDER — ACETAMINOPHEN 325 MG PO TABS
650.0000 mg | ORAL_TABLET | Freq: Once | ORAL | Status: AC
  Administered 2014-03-31: 650 mg via ORAL
  Filled 2014-03-31: qty 2

## 2014-03-31 MED ORDER — ONDANSETRON 8 MG PO TBDP: 8.0000 mg | ORAL_TABLET | Freq: Three times a day (TID) | ORAL | Status: DC | PRN

## 2014-03-31 NOTE — Discharge Instructions (Signed)
We saw you in the ER for the fevers, vomiting, upper respiratory infection like symptoms We think what you have is a viral syndrome - the treatment for which is symptomatic relief only, and your body will fight the infection off in a few days. We are prescribing you some meds for pain and fevers. See your primary care doctor in 1 week if the symptoms dont improve.   Viral Infections A viral infection can be caused by different types of viruses.Most viral infections are not serious and resolve on their own. However, some infections may cause severe symptoms and may lead to further complications. SYMPTOMS Viruses can frequently cause:  Minor sore throat.  Aches and pains.  Headaches.  Runny nose.  Different types of rashes.  Watery eyes.  Tiredness.  Cough.  Loss of appetite.  Gastrointestinal infections, resulting in nausea, vomiting, and diarrhea. These symptoms do not respond to antibiotics because the infection is not caused by bacteria. However, you might catch a bacterial infection following the viral infection. This is sometimes called a "superinfection." Symptoms of such a bacterial infection may include:  Worsening sore throat with pus and difficulty swallowing.  Swollen neck glands.  Chills and a high or persistent fever.  Severe headache.  Tenderness over the sinuses.  Persistent overall ill feeling (malaise), muscle aches, and tiredness (fatigue).  Persistent cough.  Yellow, green, or brown mucus production with coughing. HOME CARE INSTRUCTIONS   Only take over-the-counter or prescription medicines for pain, discomfort, diarrhea, or fever as directed by your caregiver.  Drink enough water and fluids to keep your urine clear or pale yellow. Sports drinks can provide valuable electrolytes, sugars, and hydration.  Get plenty of rest and maintain proper nutrition. Soups and broths with crackers or rice are fine. SEEK IMMEDIATE MEDICAL CARE IF:   You have  severe headaches, shortness of breath, chest pain, neck pain, or an unusual rash.  You have uncontrolled vomiting, diarrhea, or you are unable to keep down fluids.  You or your child has an oral temperature above 102 F (38.9 C), not controlled by medicine.  Your baby is older than 3 months with a rectal temperature of 102 F (38.9 C) or higher.  Your baby is 503 months old or younger with a rectal temperature of 100.4 F (38 C) or higher. MAKE SURE YOU:   Understand these instructions.  Will watch your condition.  Will get help right away if you are not doing well or get worse. Document Released: 10/20/2004 Document Revised: 04/04/2011 Document Reviewed: 05/17/2010 [facility] Patient Information 2015 [place]. This information is not intended to replace advice given to you by your health care provider. Make sure you discuss any questions you have with your health care provider. Viral Gastroenteritis Viral gastroenteritis is also known as stomach flu. This condition affects the stomach and intestinal tract. It can cause sudden diarrhea and vomiting. The illness typically lasts 3 to 8 days. Most people develop an immune response that eventually gets rid of the virus. While this natural response develops, the virus can make you quite ill. CAUSES  Many different viruses can cause gastroenteritis, such as rotavirus or noroviruses. You can catch one of these viruses by consuming contaminated food or water. You may also catch a virus by sharing utensils or other personal items with an infected person or by touching a contaminated surface. SYMPTOMS  The most common symptoms are diarrhea and vomiting. These problems can cause a severe loss of body fluids (dehydration) and a  body salt (electrolyte) imbalance. Other symptoms may include:  Fever.  Headache.  Fatigue.  Abdominal pain. DIAGNOSIS  Your caregiver can usually diagnose viral gastroenteritis based on your symptoms and a physical  exam. A stool sample may also be taken to test for the presence of viruses or other infections. TREATMENT  This illness typically goes away on its own. Treatments are aimed at rehydration. The most serious cases of viral gastroenteritis involve vomiting so severely that you are not able to keep fluids down. In these cases, fluids must be given through an intravenous line (IV). HOME CARE INSTRUCTIONS   Drink enough fluids to keep your urine clear or pale yellow. Drink small amounts of fluids frequently and increase the amounts as tolerated.  Ask your caregiver for specific rehydration instructions.  Avoid:  Foods high in sugar.  Alcohol.  Carbonated drinks.  Tobacco.  Juice.  Caffeine drinks.  Extremely hot or cold fluids.  Fatty, greasy foods.  Too much intake of anything at one time.  Dairy products until 24 to 48 hours after diarrhea stops.  You may consume probiotics. Probiotics are active cultures of beneficial bacteria. They may lessen the amount and number of diarrheal stools in adults. Probiotics can be found in yogurt with active cultures and in supplements.  Wash your hands well to avoid spreading the virus.  Only take over-the-counter or prescription medicines for pain, discomfort, or fever as directed by your caregiver. Do not give aspirin to children. Antidiarrheal medicines are not recommended.  Ask your caregiver if you should continue to take your regular prescribed and over-the-counter medicines.  Keep all follow-up appointments as directed by your caregiver. SEEK IMMEDIATE MEDICAL CARE IF:   You are unable to keep fluids down.  You do not urinate at least once every 6 to 8 hours.  You develop shortness of breath.  You notice blood in your stool or vomit. This may look like coffee grounds.  You have abdominal pain that increases or is concentrated in one small area (localized).  You have persistent vomiting or diarrhea.  You have a fever.  The  patient is a child younger than 3 months, and he or she has a fever.  The patient is a child older than 3 months, and he or she has a fever and persistent symptoms.  The patient is a child older than 3 months, and he or she has a fever and symptoms suddenly get worse.  The patient is a baby, and he or she has no tears when crying. MAKE SURE YOU:   Understand these instructions.  Will watch your condition.  Will get help right away if you are not doing well or get worse. Document Released: 01/10/2005 Document Revised: 04/04/2011 Document Reviewed: 10/27/2010 [facility] Patient Information 2015 [place], [place]. This information is not intended to replace advice given to you by your health care provider. Make sure you discuss any questions you have with your health care provider.

## 2014-03-31 NOTE — ED Provider Notes (Addendum)
CSN: [id number]     Arrival date & time 03/31/14  1020 History   First MD Initiated Contact with Patient 03/31/14 1047     Chief Complaint  Patient presents with  . URI  . Emesis     (Consider location/radiation/quality/duration/timing/severity/associated sxs/prior Treatment) HPI Comments: Pt comes in with cc of uri like sx and emesis. She has been sick for a week now, with uri like sx, malaise and fevers. Today, she started having emesis, which prompted her to come to the ER. Pt has no abd pain, no uti like sx and no vaginal discharge or bleeding. She reports having food at [facility] last night, and that her sx starting in the late night/early morning. Emesis x 3-5 times, now bilious.  Patient is a 19 y.o. female presenting with URI and vomiting. The history is provided by the patient.  URI Presenting symptoms: fatigue and fever   Associated symptoms: no headaches and no neck pain   Emesis Associated symptoms: URI   Associated symptoms: no abdominal pain and no headaches     Past Medical History  Diagnosis Date  . Medical history non-contributory    Past Surgical History  Procedure Laterality Date  . Breast lumpectomy     Family History  Problem Relation Age of Onset  . Hypertension Mother   . Diabetes Maternal Grandmother    History  Substance Use Topics  . Smoking status: Never Smoker   . Smokeless tobacco: Never Used  . Alcohol Use: No   OB History    Gravida Para Term Preterm AB TAB SAB Ectopic Multiple Living   Review of Systems  Constitutional: Positive for fever and fatigue. Negative for activity change.  Respiratory: Negative for shortness of breath.   Cardiovascular: Negative for chest pain.  Gastrointestinal: Positive for nausea and vomiting. Negative for abdominal pain.  Genitourinary: Negative for dysuria.  Musculoskeletal: Negative for neck pain.  Neurological: Positive for weakness. Negative for headaches.      Allergies   Shellfish allergy  Home Medications   Prior to Admission medications   Medication Sig Start Date End Date Taking? Authorizing Provider  ibuprofen (ADVIL,MOTRIN) 600 MG tablet Take 1 tablet (600 mg total) by mouth every 6 (six) hours. Patient taking differently: Take 600 mg by mouth every 6 (six) hours as needed for fever, moderate pain or cramping.  03/21/13  Yes Naima Dillard, MD  cyclobenzaprine (FLEXERIL) 5 MG tablet Take 1 tablet (5 mg total) by mouth 3 (three) times daily as needed for muscle spasms. Patient not taking: Reported on 03/31/2014 03/21/13   Jaymes Graff, MD  norethindrone (ORTHO MICRONOR) 0.35 MG tablet Take 1 tablet (0.35 mg total) by mouth daily. Patient not taking: Reported on 03/31/2014 04/06/13   Sanda Klein, CNM  oxyCODONE-acetaminophen (PERCOCET/ROXICET) 5-325 MG per tablet Take 1-2 tablets by mouth every 4 (four) hours as needed for severe pain (moderate - severe pain). Patient not taking: Reported on 03/31/2014 03/21/13   Jaymes Graff, MD   BP 125/64 mmHg  Pulse 126  Temp(Src) 101.3 F (38.5 C) (Oral)  Resp 18  SpO2 100%  LMP 03/25/2014 (Approximate) Physical Exam  Constitutional: She is oriented to person, place, and time. She appears well-developed and well-nourished.  HENT:  Head: Normocephalic and atraumatic.  Eyes: EOM are normal. Pupils are equal, round, and reactive to light.  Neck: Neck supple.  L sided tonsillar exudate  Cardiovascular: Normal rate, regular rhythm  and normal heart sounds.   No murmur heard. Pulmonary/Chest: Effort normal. No respiratory distress.  Abdominal: Soft. She exhibits no distension. There is tenderness. There is no rebound and no guarding.  Epigastric and left sided -upper quadrants  Lymphadenopathy:    She has cervical adenopathy.  Neurological: She is alert and oriented to person, place, and time.  Skin: Skin is warm and dry.  Nursing note and vitals reviewed.   ED Course  Procedures (including critical care  time) Labs Review Labs Reviewed  COMPREHENSIVE METABOLIC PANEL - Abnormal; Notable for the following:    Potassium 3.3 (*)    Glucose, Bld 126 (*)    Total Protein 8.7 (*)    ALT 38 (*)    Alkaline Phosphatase 170 (*)    All other components within normal limits  CBC WITH DIFFERENTIAL/PLATELET - Abnormal; Notable for the following:    WBC 11.4 (*)    RBC 5.95 (*)    MCV 74.6 (*)    MCH 23.7 (*)    Neutrophils Relative % 88 (*)    Lymphocytes Relative 6 (*)    Neutro Abs 10.0 (*)    All other components within normal limits  URINALYSIS, ROUTINE W REFLEX MICROSCOPIC - Abnormal; Notable for the following:    Color, Urine AMBER (*)    APPearance TURBID (*)    Hgb urine dipstick LARGE (*)    Bilirubin Urine SMALL (*)    Protein, ur 30 (*)    Leukocytes, UA TRACE (*)    All other components within normal limits  URINE MICROSCOPIC-ADD ON - Abnormal; Notable for the following:    Squamous Epithelial / LPF MANY (*)    Bacteria, UA MANY (*)    All other components within normal limits  RAPID STREP SCREEN  CULTURE, GROUP A STREP  POC URINE PREG, ED    Imaging Review Dg Chest 2 View  03/31/2014   CLINICAL DATA:  Cough for 1 week, cold symptoms  EXAM: CHEST  2 VIEW  COMPARISON:  None.  FINDINGS: Cardiomediastinal silhouette is unremarkable. No acute infiltrate or pleural effusion. No pulmonary edema. Bony thorax is unremarkable.  IMPRESSION: No active cardiopulmonary disease.   Electronically Signed   By: Natasha MeadLiviu  Pop M.D.   On: 03/31/2014 11:09     EKG Interpretation None      @1 :00 pm - po challenge passed. Vitals improved.  MDM   Final diagnoses:  Viral syndrome  URI (upper respiratory infection)  Nausea and vomiting in adult   Pt comes in with cc of nausea and emesis. She also has fevers and uri like sx - which have been present for dew days. Pt's labs show mild leukocytosis, otherwise they are reassuring.  Pt has 3/4 centor criteria. [place] strep ordered, and is  neg. Pt's cxr is also clear. Abd exam - left upper quadrant and epigastric tenderness. Will start oral challenge, anticipate discharge, suspect gastroenteritis.   Derwood KaplanAnkit Sebasthian Stailey, MD 03/31/14 1233  Derwood KaplanAnkit Shanti Eichel, MD 03/31/14 1313

## 2014-03-31 NOTE — Progress Notes (Signed)
  CARE MANAGEMENT ED NOTE 03/31/2014  Patient:  Debbie Mercer,Debbie Mercer   Account Number:  [id number]  Date Initiated:  03/31/2014  Documentation initiated by:  Edd ArbourGIBBS,Adilynn Bessey  Subjective/Objective Assessment:   19 yr old medicaid [place] pt cold symptoms for about a week, diarrhea and vomiting started last night, lower back pain with no dysuria     Subjective/Objective Assessment Detail:   no mediciad pcp per pt  Pt [facility] proir to this ED visit     Action/Plan:   confirmed no pcp, provided pt with a list of [place] medicaid providers to assist with choice of medicaid pcp for f/u services Discussed differences in ob gyn, pcp and EDP services Discussed importance of pcp f/u services DSS   Action/Plan Detail:   contact number provided also   Anticipated DC Date:  03/31/2014     Status Recommendation to Physician:   Result of Recommendation:    Other ED Services  Consult Working Plan    DC Planning Services  Other  Outpatient Services - Pt will follow up  PCP issues    Choice offered to / List presented to:            Status of service:  Completed, signed off  ED Comments:   ED Comments Detail:

## 2014-03-31 NOTE — ED Notes (Signed)
Per pt, cold symptoms for about a week, diarrhea and vomiting started last night, lower back pain with no dysuria

## 2014-04-02 LAB — CULTURE, GROUP A STREP

## 2015-01-07 ENCOUNTER — Emergency Department (HOSPITAL_COMMUNITY)
Admission: EM | Admit: 2015-01-07 | Discharge: 2015-01-07 | Disposition: A | Discharge status: Home / Self Care | Payer: Medicaid Other | Attending: Emergency Medicine | Admitting: Emergency Medicine

## 2015-01-07 ENCOUNTER — Encounter (HOSPITAL_COMMUNITY): Payer: Self-pay | Admitting: Vascular Surgery

## 2015-01-07 DIAGNOSIS — R1013 Epigastric pain: Secondary | ICD-10-CM | POA: Insufficient documentation

## 2015-01-07 DIAGNOSIS — R112 Nausea with vomiting, unspecified: Secondary | ICD-10-CM | POA: Insufficient documentation

## 2015-01-07 DIAGNOSIS — Z3202 Encounter for pregnancy test, result negative: Secondary | ICD-10-CM | POA: Insufficient documentation

## 2015-01-07 DIAGNOSIS — R5383 Other fatigue: Secondary | ICD-10-CM | POA: Insufficient documentation

## 2015-01-07 LAB — CBC
HCT: 43.7 % (ref 36.0–46.0)
Hemoglobin: 13.5 g/dL (ref 12.0–15.0)
MCH: 23.5 pg — ABNORMAL LOW (ref 26.0–34.0)
MCHC: 30.9 g/dL (ref 30.0–36.0)
MCV: 76.1 fL — ABNORMAL LOW (ref 78.0–100.0)
PLATELETS: 329 10*3/uL (ref 150–400)
RBC: 5.74 MIL/uL — ABNORMAL HIGH (ref 3.87–5.11)
RDW: 13.4 % (ref 11.5–15.5)
WBC: 10 10*3/uL (ref 4.0–10.5)

## 2015-01-07 LAB — COMPREHENSIVE METABOLIC PANEL
ALK PHOS: 63 U/L (ref 38–126)
ALT: 17 U/L (ref 14–54)
AST: 22 U/L (ref 15–41)
Albumin: 4.4 g/dL (ref 3.5–5.0)
Anion gap: 8 (ref 5–15)
BILIRUBIN TOTAL: 0.8 mg/dL (ref 0.3–1.2)
BUN: 8 mg/dL (ref 6–20)
CALCIUM: 9.5 mg/dL (ref 8.9–10.3)
CO2: 28 mmol/L (ref 22–32)
Chloride: 105 mmol/L (ref 101–111)
Creatinine, Ser: 0.75 mg/dL (ref 0.44–1.00)
Glucose, Bld: 100 mg/dL — ABNORMAL HIGH (ref 65–99)
Potassium: 3.8 mmol/L (ref 3.5–5.1)
Sodium: 141 mmol/L (ref 135–145)
Total Protein: 7.9 g/dL (ref 6.5–8.1)

## 2015-01-07 LAB — URINALYSIS, ROUTINE W REFLEX MICROSCOPIC
Bilirubin Urine: NEGATIVE
Glucose, UA: NEGATIVE mg/dL
Ketones, ur: NEGATIVE mg/dL
Nitrite: NEGATIVE
Protein, ur: NEGATIVE mg/dL
Specific Gravity, Urine: 1.017 (ref 1.005–1.030)
pH: 7 (ref 5.0–8.0)

## 2015-01-07 LAB — LIPASE, BLOOD: LIPASE: 26 U/L (ref 11–51)

## 2015-01-07 LAB — URINE MICROSCOPIC-ADD ON

## 2015-01-07 LAB — POC URINE PREG, ED: PREG TEST UR: NEGATIVE

## 2015-01-07 MED ORDER — ONDANSETRON 4 MG PO TBDP
ORAL_TABLET | ORAL | Status: AC
  Filled 2015-01-07: qty 1

## 2015-01-07 MED ORDER — ONDANSETRON HCL 4 MG PO TABS: 4.0000 mg | ORAL_TABLET | Freq: Four times a day (QID) | ORAL | Status: DC

## 2015-01-07 MED ORDER — SODIUM CHLORIDE 0.9 % IV BOLUS (SEPSIS)
1000.0000 mL | Freq: Once | INTRAVENOUS | Status: AC
  Administered 2015-01-07: 1000 mL via INTRAVENOUS

## 2015-01-07 MED ORDER — ONDANSETRON 4 MG PO TBDP
4.0000 mg | ORAL_TABLET | Freq: Once | ORAL | Status: AC | PRN
  Administered 2015-01-07: 4 mg via ORAL

## 2015-01-07 MED ORDER — ONDANSETRON HCL 4 MG/2ML IJ SOLN
4.0000 mg | Freq: Once | INTRAMUSCULAR | Status: AC
Start: 2015-01-07 — End: 2015-01-07
  Administered 2015-01-07: 4 mg via INTRAVENOUS
  Filled 2015-01-07: qty 2

## 2015-01-07 NOTE — ED Provider Notes (Signed)
CSN: [id number]     Arrival date & time 01/07/15  1309 History  By signing my name below, I, Debbie Mercer, attest that this documentation has been prepared under the direction and in the presence of [employer], PA-C. Electronically Signed: Placido Mercer, ED Scribe. 01/07/2015. 2:27 PM.   Chief Complaint  Patient presents with  . Emesis   The history is provided by the patient. No language interpreter was used.    HPI Comments: Debbie Mercer is a 19 y.o. female who presents to the Emergency Department complaining of moderate n/v with onset this morning. Pt notes that she attended a pot luck last night and upon waking this morning notes 8x emesis. She notes some associated, mild, fatigue and abd pain. Pt notes drinking ginger ale which she says provided some mild relief. She denies consuming ETOH and denies any known medical conditions. Pt's LNMP was 2 days ago. She denies hematemesis, diarrhea and bloody stool.   Past Medical History  Diagnosis Date  . Medical history non-contributory    Past Surgical History  Procedure Laterality Date  . Breast lumpectomy     Family History  Problem Relation Age of Onset  . Hypertension Mother   . Diabetes Maternal Grandmother    Social History  Substance Use Topics  . Smoking status: Never Smoker   . Smokeless tobacco: Never Used  . Alcohol Use: No   OB History    Gravida Para Term Preterm AB TAB SAB Ectopic Multiple Living   Review of Systems  Constitutional: Positive for fatigue.  Gastrointestinal: Positive for nausea, vomiting and abdominal pain. Negative for diarrhea and blood in stool.  All other systems reviewed and are negative.  Allergies  Shellfish allergy  Home Medications   Prior to Admission medications   Medication Sig Start Date End Date Taking? Authorizing Provider  ibuprofen (ADVIL,MOTRIN) 600 MG tablet Take 1 tablet (600 mg total) by mouth every 6 (six) hours as needed. Patient taking  differently: Take 600 mg by mouth every 6 (six) hours as needed for moderate pain.  03/31/14  Yes Ankit Rhunette Croft, MD  ondansetron (ZOFRAN ODT) 8 MG disintegrating tablet Take 1 tablet (8 mg total) by mouth every 8 (eight) hours as needed for nausea. 03/31/14  Yes Derwood Kaplan, MD  cyclobenzaprine (FLEXERIL) 5 MG tablet Take 1 tablet (5 mg total) by mouth 3 (three) times daily as needed for muscle spasms. Patient not taking: Reported on 03/31/2014 03/21/13   Jaymes Graff, MD  norethindrone (ORTHO MICRONOR) 0.35 MG tablet Take 1 tablet (0.35 mg total) by mouth daily. Patient not taking: Reported on 03/31/2014 04/06/13   Sanda Klein, CNM  ondansetron (ZOFRAN) 4 MG tablet Take 1 tablet (4 mg total) by mouth every 6 (six) hours. 01/07/15   [place] Patel-Mills, PA-C  oxyCODONE-acetaminophen (PERCOCET/ROXICET) 5-325 MG per tablet Take 1-2 tablets by mouth every 4 (four) hours as needed for severe pain (moderate - severe pain). Patient not taking: Reported on 03/31/2014 03/21/13   Jaymes Graff, MD   BP 114/82 mmHg  Pulse 81  Temp(Src) 98.3 F (36.8 C) (Oral)  Resp 16  Ht  (1.626 m)  Wt 79.924 kg  BMI 30.23 kg/m2  SpO2 100% Physical Exam  Constitutional: She is oriented to person, place, and time. She appears well-developed and well-nourished.  HENT:  Head: Normocephalic and atraumatic.  Mouth/Throat: No oropharyngeal exudate.  Eyes: Conjunctivae are normal.  Neck:  Normal range of motion. No tracheal deviation present.  Cardiovascular: Normal rate, regular rhythm and normal heart sounds.   No murmur heard. Pulmonary/Chest: Effort normal and breath sounds normal. No respiratory distress. She has no wheezes. She has no rales.  Abdominal: Soft. She exhibits no distension. There is tenderness. There is no rebound and no guarding.  Mild epigastric TTP; no guarding or rebound; no abd distension  Musculoskeletal: Normal range of motion.  Neurological: She is alert and oriented to person, place, and  time.  Skin: Skin is warm and dry.  Psychiatric: She has a normal mood and affect. Her behavior is normal.  Nursing note and vitals reviewed.  ED Course  Procedures  DIAGNOSTIC STUDIES: Oxygen Saturation is 100% on RA, normal by my interpretation.    COORDINATION OF CARE: 2:22 PM Pt presents today due to n/v s/p attending a pot luck. Discussed next steps with pt including IV abx and reevaluation. Pt agreed to plan.   Labs Review Labs Reviewed  COMPREHENSIVE METABOLIC PANEL - Abnormal; Notable for the following:    Glucose, Bld 100 (*)    All other components within normal limits  CBC - Abnormal; Notable for the following:    RBC 5.74 (*)    MCV 76.1 (*)    MCH 23.5 (*)    All other components within normal limits  URINALYSIS, ROUTINE W REFLEX MICROSCOPIC (NOT AT [facility]) - Abnormal; Notable for the following:    Hgb urine dipstick MODERATE (*)    Leukocytes, UA SMALL (*)    All other components within normal limits  URINE MICROSCOPIC-ADD ON - Abnormal; Notable for the following:    Squamous Epithelial / LPF 6-30 (*)    Bacteria, UA FEW (*)    All other components within normal limits  LIPASE, BLOOD  POC URINE PREG, ED    Imaging Review No results found. I have personally reviewed and evaluated these lab results as part of my medical decision-making.   EKG Interpretation None      MDM   Final diagnoses:  Non-intractable vomiting with nausea, vomiting of unspecified type   Pt presents today with n/v and weakness s/p attending a pot luck yesterday. Her labs are not concerning and she has no significant abdominal pain or diarrhea. She is afebrile vitals are stable. Medications  ondansetron (ZOFRAN-ODT) disintegrating tablet 4 mg (4 mg Oral Given 01/07/15 1320)  ondansetron (ZOFRAN) injection 4 mg (4 mg Intravenous Given 01/07/15 1451)  sodium chloride 0.9 % bolus 1,000 mL (0 mLs Intravenous Stopped 01/07/15 1622)   Recheck: He should states she is feeling much better  after fluids and Zofran. Patient has had no episodes of vomiting while in the ED and is tolerating PO fluids. I think that her nausea and vomiting is related to the food she ate last night. I do not believe this is serious but I discussed return precautions with the patient. Her labs were not concerning for pancreatitis, cholecystitis or pregnancy. She was prescribed Zofran. I also explained that she should eat a bland diet for the next 48 hours. She is to keep well-hydrated. Follow-up was discussed Patient verbally agrees with the plan.  I personally performed the services described in this documentation, which was scribed in my presence. The recorded information has been reviewed and is accurate.    Catha GosselinHanna Patel-Mills, PA-C 01/09/15 1544  Benjiman CoreNathan Pickering, MD 01/09/15 [phone]

## 2015-01-07 NOTE — ED Notes (Signed)
Pt reports to the ED for eval of N/V. She reports she went to a pot luck last night and she believes she ate some food that made her ill. This am she has had N/V. Reports 8 episodes. Denies any hematemesis or diarrhea, reports chills but unknown fevers.Denies any abd pain. Pt A&Ox4, resp e/u, and skin warm and dry.

## 2015-01-07 NOTE — Discharge Instructions (Signed)
Nausea and Vomiting Drink plenty of fluids. Return for inability to tolerate fluids. Eat a bland diet. Take zofran for nausea.  Nausea is a sick feeling that often comes before throwing up (vomiting). Vomiting is a reflex where stomach contents come out of your mouth. Vomiting can cause severe loss of body fluids (dehydration). Children and elderly adults can become dehydrated quickly, especially if they also have diarrhea. Nausea and vomiting are symptoms of a condition or disease. It is important to find the cause of your symptoms. CAUSES   Direct irritation of the stomach lining. This irritation can result from increased acid production (gastroesophageal reflux disease), infection, food poisoning, taking certain medicines (such as nonsteroidal anti-inflammatory drugs), alcohol use, or tobacco use.  Signals from the brain.These signals could be caused by a headache, heat exposure, an inner ear disturbance, increased pressure in the brain from injury, infection, a tumor, or a concussion, pain, emotional stimulus, or metabolic problems.  An obstruction in the gastrointestinal tract (bowel obstruction).  Illnesses such as diabetes, hepatitis, gallbladder problems, appendicitis, kidney problems, cancer, sepsis, atypical symptoms of a heart attack, or eating disorders.  Medical treatments such as chemotherapy and radiation.  Receiving medicine that makes you sleep (general anesthetic) during surgery. DIAGNOSIS Your caregiver may ask for tests to be done if the problems do not improve after a few days. Tests may also be done if symptoms are severe or if the reason for the nausea and vomiting is not clear. Tests may include:  Urine tests.  Blood tests.  Stool tests.  Cultures (to look for evidence of infection).  X-rays or other imaging studies. Test results can help your caregiver make decisions about treatment or the need for additional tests. TREATMENT You need to stay well hydrated.  Drink frequently but in small amounts.You may wish to drink water, sports drinks, clear broth, or eat frozen ice pops or gelatin dessert to help stay hydrated.When you eat, eating slowly may help prevent nausea.There are also some antinausea medicines that may help prevent nausea. HOME CARE INSTRUCTIONS   Take all medicine as directed by your caregiver.  If you do not have an appetite, do not force yourself to eat. However, you must continue to drink fluids.  If you have an appetite, eat a normal diet unless your caregiver tells you differently.  Eat a variety of complex carbohydrates (rice, wheat, potatoes, bread), lean meats, yogurt, fruits, and vegetables.  Avoid high-fat foods because they are more difficult to digest.  Drink enough water and fluids to keep your urine clear or pale yellow.  If you are dehydrated, ask your caregiver for specific rehydration instructions. Signs of dehydration may include:  Severe thirst.  Dry lips and mouth.  Dizziness.  Dark urine.  Decreasing urine frequency and amount.  Confusion.  Rapid breathing or pulse. SEEK IMMEDIATE MEDICAL CARE IF:   You have blood or brown flecks (like coffee grounds) in your vomit.  You have black or bloody stools.  You have a severe headache or stiff neck.  You are confused.  You have severe abdominal pain.  You have chest pain or trouble breathing.  You do not urinate at least once every 8 hours.  You develop cold or clammy skin.  You continue to vomit for longer than 24 to 48 hours.  You have a fever. MAKE SURE YOU:   Understand these instructions.  Will watch your condition.  Will get help right away if you are not doing well or get worse.  This information is not intended to replace advice given to you by your health care provider. Make sure you discuss any questions you have with your health care provider.   Document Released: 01/10/2005 Document Revised: 04/04/2011 Document  Reviewed: 06/09/2010 Elsevier Interactive Patient Education 2016 [employer].   Emergency Department Resource Guide 1) Find a Doctor and Pay Out of Pocket Although you won't have to find out who is covered by your insurance plan, it is a good idea to ask around and get recommendations. You will then need to call the office and see if the doctor you have chosen will accept you as a new patient and what types of options they offer for patients who are self-pay. Some doctors offer discounts or will set up payment plans for their patients who do not have insurance, but you will need to ask so you aren't surprised when you get to your appointment.  2) Contact Your Local Health Department Not all health departments have doctors that can see patients for sick visits, but many do, so it is worth a call to see if yours does. If you don't know where your local health department is, you can check in your phone book. The CDC also has a tool to help you locate your state's health department, and many state websites also have listings of all of their local health departments.  3) Find a Walk-in Clinic If your illness is not likely to be very severe or complicated, you may want to try a walk in clinic. These are popping up all over the country in pharmacies, drugstores, and shopping centers. They're usually staffed by nurse practitioners or physician assistants that have been trained to treat common illnesses and complaints. They're usually fairly quick and inexpensive. However, if you have serious medical issues or chronic medical problems, these are probably not your best option.  No Primary Care Doctor: - Call Health Connect at  [phone] - they can help you locate a primary care doctor that  accepts your insurance, provides certain services, etc. - Physician Referral Service- [phone]  Chronic Pain Problems: Organization         Address  Phone   Notes  [facility]  [phone]  Patients need to be referred by their primary care doctor.   Medication Assistance: Organization         Address  Phone   Notes  [facility] [address] [place] [place], [place] [address] [phone] --Must be a resident of [facility] -- Must have NO insurance coverage whatsoever (no Medicaid/ Medicare, etc.) -- The pt. MUST have a primary care doctor that directs their care regularly and follows them in the community   [facility]  [phone]   [employer]  [phone]    Agencies that provide inexpensive medical care: Organization         Address  Phone   Notes  [facility]  [phone]   [facility]    [phone]   [facility] [address] [place], [place] [address] [phone]   [facility] [address] [place] [phone]   [facility]    [phone]   [facility]    [phone]   [facility]  [address], [place] Phone:  [phone], Fax:  [phone] Hours of Operation:  9 am -  6 pm, M-F.  Also accepts Medicaid/Medicare and self-pay.  [facility]  [address], [place] Phone: [phone], Fax: [phone]. Hours of Operation:  8:30 am - 5:30 pm, M-F.  Also accepts Medicaid and self-pay.  [facility] [place] [address], [place] Phone: [phone]   [facility] [address] Natasha Bence [place], [place] [phone], Ext. [phone] Mondays & Thursdays: 7-9 AM.  First 15 patients are seen on a first come, first serve basis.    Medicaid-accepting [facility] Providers:  Organization         Address  Phone   Notes  [facility] [address], [place] [phone] Also accepts self-pay patients.  [facility] [address] Laurell Josephs [place], [place]  [phone]   [facility] [address], [place] [phone]   [facility] [address] [place] [phone]   Renaye Rakers [address], [place]   [phone] Only accepts [place] patients after they have their name applied to their card.   Self-Pay (no insurance) in [facility]:  Organization         Address  Phone   Notes  Sickle Cell Patients, [facility] [address] [place], [place] [phone]   [facility] [address] [place], [place] [phone]   [facility] [place]  [address], [place] [phone]   [facility]/Dr. Osei-Bonsu  [address], [place] or [address], [place] [phone] Phone number for both [place] and [place] locations is the same.  [facility] [address], [place] [phone]   [facility] [address] [place] or [address] [phone]   [facility] [address], [place] [phone], phone; [phone], fax Sees patients 1st and 3rd Saturday of every month.  Must not qualify for public or private insurance (i.e. Medicaid, Medicare, [place] Health Choice, Veterans' Benefits)  Household income should be no more than 200% of the poverty level The clinic cannot treat you if you are pregnant or think you are pregnant  Sexually transmitted diseases are not treated at the clinic.    Dental Care: Organization         Address  Phone  Notes  [facility] Department of [facility] [address] [place], [place] [phone] Accepts children up to age 53 who are enrolled in [place] or [place] Health Choice; pregnant women with a Medicaid card; and children who have applied for Medicaid or [place] Health Choice, but were  declined, whose parents can pay a reduced fee at time of service.  [facility] of [facility]  [address], [place] [phone] Accepts children up to age 55 who are enrolled in [place] or [place] Health Choice; pregnant women with a Medicaid card; and children who have applied for Medicaid or  Health Choice, but were declined, whose parents can pay a reduced fee at time of service.  [facility]  [address] [place], [place] [phone] Patients are seen by appointment only. Walk-ins are not accepted. [facility] will see patients 21 years of age and  age and older. °Monday - Tuesday (8am-5pm) °Most Wednesdays (8:30-5pm) °$30 per visit, cash only  °[facility] ° [address], [place] [phone] Patients are seen by appointment only. Walk-ins are not accepted. [facility] will see patients 18 years of age and older. °One Wednesday Evening (Monthly: Volunteer Based).  $30 per visit, cash only  °[facility]  [phone] for adults; Children under age 4, call Graduate Pediatric Dentistry at [phone]. Children aged 4-14, please call [phone] to request a pediatric application. ° Dental services are provided in all areas of dental care including fillings, crowns and bridges, complete and partial dentures, implants, gum treatment, root canals, and extractions. Preventive care is also provided. Treatment is provided to both adults and children. °Patients are selected via a lottery and there is often a waiting list. °  °[facility] [address], °[place] ° ([phone] [email or web address] °  °[facility] [address], [place], [place] [phone], Ext. [phone] Second and Fourth Thursday of each month, opens at 6:30 AM; Clinic ends at 9 AM.  Patients are seen on a first-come first-served basis, and a limited number are seen during each  clinic.  ° °[facility] ° [address], [place], [place] [phone]   Eligibility Requirements °You must have lived in [place], [place], or [place] counties for at least the last three months. °  You cannot be eligible for state or federal sponsored healthcare insurance, including Veterans Administration, Medicaid, or Medicare. °  You generally cannot be eligible for healthcare insurance through your employer.  °  How to apply: °Eligibility screenings are held every Tuesday and Wednesday afternoon from 1:00 pm until 4:00 pm. You do not need an appointment for the interview!  °[facility] [address], [place], [place] [phone]   °[facility]  [phone]   °[facility]  [phone]   °[facility]  336-570-6415   ° °Behavioral Health Resources in the Community: °Intensive Outpatient Programs °Organization         Address  Phone  Notes  °[facility] [address], [place], [place] [phone]   °[facility] [address], [place], [place] [phone]   °[facility] [address], [place], [place] ° [phone]   °[facility] [address],  °[place], [place] 1-800-853-5163 or 336-641-4981   °Substance Abuse Resources °Organization         Address  Phone  Notes  °[facility]  336-882-2125   °[facility]  336-784-9470   °[facility]  336-285-9073   °[facility]  336-845-3988   °Residential & Outpatient Substance Abuse Program  1-800-659-3381   °Psychological Services °Organization         Address  Phone  Notes  °[facility]  336- [phone]   °[facility]  336- 378-7881   °[facility] [address], [place] 1-800-853-5163 or 336-641-4981   ° °Mobile Crisis Teams °Organization         Address  Phone  Notes  °[facility], Mobile  Crisis Care Unit  1-877-626-1772   °Assertive °Psychotherapeutic Services ° [address] [place], [place] 336-834-9664   °Sharon DeEsch [address] °[place] 336-554-5454   ° °Self-Help/Support Groups °Organization           Phone             Notes  [facility] - variety of support groups  336- I7437963 Call for more information  Narcotics Anonymous (NA), [facility] [address], [employer] [place]  2 meetings at this location   Statistician         Address  Phone  Notes  [facility] [address],    [place]  [phone]   [facility]  [address], [place] [address], [place], [place] [phone]   [facility] [address] [place], [place] [phone] Admissions: 8am-3pm M-F  [facility] [address]    [place], [place] [phone]   [facility] [address] [place], [place], [place] [phone]   [facility] [address],  [place], [place] [phone]   [facility] Intensive Outpatient [address] Laurell Josephs [address], [place], [place] [phone]   [facility] ([facility] [address] [place]  [place], [place] 1-601-093-2355 or [phone]   [facility] ([facility]) [address] [place], [place] [phone] Accepts Medicaid  [facility] [address],  [place] [phone] Substance Abuse/Addiction Treatment   [facility] Organization         Address  Phone  Notes  [facility]  ([phone]   Angie Fava, PhD [address] Ervin Knack [place], [place]   [phone] or [phone]   [facility]   [address] [place], [place] [phone]   [facility] [address], [place], [place] [phone] Insurance/Medicaid/sponsorship through [facility] [address]                                     [place], [place] [phone] Therapy/tele-psych/case  [facility] [address] [place], [place] [phone]    Dr. Lolly Mustache  [phone]   [facility]  [employer] [facility] Dept. 1) [address] [place] 2) [address] [place] 3)  [address], [place] [phone]  [phone]   [facility] Child Abuse Hotline 939-665-0959 or [phone] (After Hours)

## 2016-04-02 ENCOUNTER — Inpatient Hospital Stay (HOSPITAL_COMMUNITY): Payer: BLUE CROSS/BLUE SHIELD

## 2016-04-02 ENCOUNTER — Encounter (HOSPITAL_COMMUNITY): Payer: Self-pay

## 2016-04-02 ENCOUNTER — Inpatient Hospital Stay (HOSPITAL_COMMUNITY)
Admission: AD | Admit: 2016-04-02 | Discharge: 2016-04-02 | Disposition: A | Discharge status: Home / Self Care | Payer: BLUE CROSS/BLUE SHIELD | Source: Ambulatory Visit | Attending: Obstetrics & Gynecology | Admitting: Obstetrics & Gynecology

## 2016-04-02 DIAGNOSIS — O26851 Spotting complicating pregnancy, first trimester: Secondary | ICD-10-CM | POA: Diagnosis not present

## 2016-04-02 DIAGNOSIS — R109 Unspecified abdominal pain: Secondary | ICD-10-CM | POA: Diagnosis not present

## 2016-04-02 DIAGNOSIS — Z3A01 Less than 8 weeks gestation of pregnancy: Secondary | ICD-10-CM | POA: Diagnosis not present

## 2016-04-02 DIAGNOSIS — O26899 Other specified pregnancy related conditions, unspecified trimester: Secondary | ICD-10-CM

## 2016-04-02 DIAGNOSIS — O3680X0 Pregnancy with inconclusive fetal viability, not applicable or unspecified: Secondary | ICD-10-CM

## 2016-04-02 DIAGNOSIS — O208 Other hemorrhage in early pregnancy: Secondary | ICD-10-CM

## 2016-04-02 DIAGNOSIS — O26891 Other specified pregnancy related conditions, first trimester: Secondary | ICD-10-CM

## 2016-04-02 LAB — HCG, QUANTITATIVE, PREGNANCY: hCG, Beta Chain, Quant, S: 878 m[IU]/mL — ABNORMAL HIGH (ref <5)

## 2016-04-02 LAB — URINALYSIS, ROUTINE W REFLEX MICROSCOPIC
Bilirubin Urine: NEGATIVE
GLUCOSE, UA: NEGATIVE mg/dL
Hgb urine dipstick: NEGATIVE
KETONES UR: NEGATIVE mg/dL
Nitrite: NEGATIVE
PROTEIN: NEGATIVE mg/dL
Specific Gravity, Urine: 1.023 (ref 1.005–1.030)
pH: 7 (ref 5.0–8.0)

## 2016-04-02 LAB — CBC WITH DIFFERENTIAL/PLATELET
Basophils Absolute: 0 10*3/uL (ref 0.0–0.1)
Basophils Relative: 0 %
EOS PCT: 2 %
Eosinophils Absolute: 0.2 10*3/uL (ref 0.0–0.7)
HEMATOCRIT: 39.2 % (ref 36.0–46.0)
HEMOGLOBIN: 12.5 g/dL (ref 12.0–15.0)
LYMPHS ABS: 2.3 10*3/uL (ref 0.7–4.0)
LYMPHS PCT: 26 %
MCH: 23.6 pg — AB (ref 26.0–34.0)
MCHC: 31.9 g/dL (ref 30.0–36.0)
MCV: 74.1 fL — AB (ref 78.0–100.0)
Monocytes Absolute: 0.4 10*3/uL (ref 0.1–1.0)
Monocytes Relative: 5 %
Neutro Abs: 5.7 10*3/uL (ref 1.7–7.7)
Neutrophils Relative %: 66 %
PLATELETS: 309 10*3/uL (ref 150–400)
RBC: 5.29 MIL/uL — AB (ref 3.87–5.11)
RDW: 13.6 % (ref 11.5–15.5)
WBC: 8.7 10*3/uL (ref 4.0–10.5)

## 2016-04-02 LAB — WET PREP, GENITAL
Clue Cells Wet Prep HPF POC: NONE SEEN
Sperm: NONE SEEN
TRICH WET PREP: NONE SEEN
Yeast Wet Prep HPF POC: NONE SEEN

## 2016-04-02 LAB — POCT PREGNANCY, URINE: Preg Test, Ur: POSITIVE — AB

## 2016-04-02 NOTE — Discharge Instructions (Signed)
First Trimester of Pregnancy The first trimester of pregnancy is from week 1 until the end of week 13 (months 1 through 3). During this time, your baby will begin to develop inside you. At 6-8 weeks, the eyes and face are formed, and the heartbeat can be seen on ultrasound. At the end of 12 weeks, all the baby's organs are formed. Prenatal care is all the medical care you receive before the birth of your baby. Make sure you get good prenatal care and follow all of your doctor's instructions. Follow these instructions at home: Medicines  Take over-the-counter and prescription medicines only as told by your doctor. Some medicines are safe and some medicines are not safe during pregnancy.  Take a prenatal vitamin that contains at least 600 micrograms (mcg) of folic acid.  If you have trouble pooping (constipation), take medicine that will make your stool soft (stool softener) if your doctor approves. Eating and drinking  Eat regular, healthy meals.  Your doctor will tell you the amount of weight gain that is right for you.  Avoid raw meat and uncooked cheese.  If you feel sick to your stomach (nauseous) or throw up (vomit): ? Eat 4 or 5 small meals a day instead of 3 large meals. ? Try eating a few soda crackers. ? Drink liquids between meals instead of during meals.  To prevent constipation: ? Eat foods that are high in fiber, like fresh fruits and vegetables, whole grains, and beans. ? Drink enough fluids to keep your pee (urine) clear or pale yellow. Activity  Exercise only as told by your doctor. Stop exercising if you have cramps or pain in your lower belly (abdomen) or low back.  Do not exercise if it is too hot, too humid, or if you are in a place of great height (high altitude).  Try to avoid standing for long periods of time. Move your legs often if you must stand in one place for a long time.  Avoid heavy lifting.  Wear low-heeled shoes. Sit and stand up straight.  You  can have sex unless your doctor tells you not to. Relieving pain and discomfort  Wear a good support bra if your breasts are sore.  Take warm water baths (sitz baths) to soothe pain or discomfort caused by hemorrhoids. Use hemorrhoid cream if your doctor says it is okay.  Rest with your legs raised if you have leg cramps or low back pain.  If you have puffy, bulging veins (varicose veins) in your legs: ? Wear support hose or compression stockings as told by your doctor. ? Raise (elevate) your feet for 15 minutes, 3-4 times a day. ? Limit salt in your food. Prenatal care  Schedule your prenatal visits by the twelfth week of pregnancy.  Write down your questions. Take them to your prenatal visits.  Keep all your prenatal visits as told by your doctor. This is important. Safety  Wear your seat belt at all times when driving.  Make a list of emergency phone numbers. The list should include numbers for family, friends, the hospital, and police and fire departments. General instructions  Ask your doctor for a referral to a local prenatal class. Begin classes no later than at the start of month 6 of your pregnancy.  Ask for help if you need counseling or if you need help with nutrition. Your doctor can give you advice or tell you where to go for help.  Do not use hot tubs, steam rooms, or   saunas.  Do not douche or use tampons or scented sanitary pads.  Do not cross your legs for long periods of time.  Avoid all herbs and alcohol. Avoid drugs that are not approved by your doctor.  Do not use any tobacco products, including cigarettes, chewing tobacco, and electronic cigarettes. If you need help quitting, ask your doctor. You may get counseling or other support to help you quit.  Avoid cat litter boxes and soil used by cats. These carry germs that can cause birth defects in the baby and can cause a loss of your baby (miscarriage) or stillbirth.  Visit your dentist. At home, brush  your teeth with a soft toothbrush. Be gentle when you floss. Contact a doctor if:  You are dizzy.  You have mild cramps or pressure in your lower belly.  You have a nagging pain in your belly area.  You continue to feel sick to your stomach, you throw up, or you have watery poop (diarrhea).  You have a bad smelling fluid coming from your vagina.  You have pain when you pee (urinate).  You have increased puffiness (swelling) in your face, hands, legs, or ankles. Get help right away if:  You have a fever.  You are leaking fluid from your vagina.  You have spotting or bleeding from your vagina.  You have very bad belly cramping or pain.  You gain or lose weight rapidly.  You throw up blood. It may look like coffee grounds.  You are around people who have German measles, fifth disease, or chickenpox.  You have a very bad headache.  You have shortness of breath.  You have any kind of trauma, such as from a fall or a car accident. Summary  The first trimester of pregnancy is from week 1 until the end of week 13 (months 1 through 3).  To take care of yourself and your unborn baby, you will need to eat healthy meals, take medicines only if your doctor tells you to do so, and do activities that are safe for you and your baby.  Keep all follow-up visits as told by your doctor. This is important as your doctor will have to ensure that your baby is healthy and growing well. This information is not intended to replace advice given to you by your health care provider. Make sure you discuss any questions you have with your health care provider. Document Released: 06/29/2007 Document Revised: 01/19/2016 Document Reviewed: 01/19/2016 Elsevier Interactive Patient Education  2017 [employer]  

## 2016-04-02 NOTE — MAU Note (Signed)
2 positive HPT at home, spotting last night.

## 2016-04-02 NOTE — MAU Provider Note (Signed)
History     CSN: [id number]  Arrival date and time: 04/02/16 1304   First Provider Initiated Contact with Patient 04/02/16 1333      Chief Complaint  Patient presents with  . Possible Pregnancy   HPI Ms. Epsie R Blyth is a 21 y.o. G2P1001 at [redacted]w[redacted]d who presents to MAU today with complaint of cramping and spotting. The patient states +HPT and last night started cramping. She felt it was moderate to severe last night, and better today, but still intermittent. She states spotting last night and this morning. She denies fever, N/V/D or UTI symptoms. LMP early February was shorter than usual.   OB History    Gravida Para Term Preterm AB Living   2 1 1     1    SAB TAB Ectopic Multiple Live Births           1      Past Medical History:  Diagnosis Date  . Medical history non-contributory     Past Surgical History:  Procedure Laterality Date  . BREAST LUMPECTOMY      Family History  Problem Relation Age of Onset  . Hypertension Mother   . Diabetes Maternal Grandmother     Social History  Substance Use Topics  . Smoking status: Never Smoker  . Smokeless tobacco: Never Used  . Alcohol use No    Allergies:  Allergies  Allergen Reactions  . Shellfish Allergy Anaphylaxis    No prescriptions prior to admission.    Review of Systems  Constitutional: Negative for fever.  Gastrointestinal: Positive for abdominal pain. Negative for constipation, diarrhea, nausea and vomiting.  Genitourinary: Positive for vaginal bleeding. Negative for dysuria, frequency, urgency and vaginal discharge.   Physical Exam   Blood pressure 113/62, pulse 111, temperature 99 F (37.2 C), temperature source Oral, resp. rate 18, last menstrual period 03/01/2016, currently breastfeeding.  Physical Exam  Nursing note and vitals reviewed. Constitutional: She is oriented to person, place, and time. She appears well-developed and well-nourished. No distress.  HENT:  Head: Normocephalic and  atraumatic.  Cardiovascular: Normal rate.   Respiratory: Effort normal.  GI: Soft. She exhibits no distension and no mass. There is no tenderness. There is no rebound and no guarding.  Genitourinary: Uterus is enlarged (slightly). Uterus is not tender. Cervix exhibits no motion tenderness, no discharge and no friability. Right adnexum displays no mass and no tenderness. Left adnexum displays no mass and no tenderness. No bleeding in the vagina. No vaginal discharge found.  Neurological: She is alert and oriented to person, place, and time.  Skin: Skin is warm and dry. No erythema.  Psychiatric: She has a normal mood and affect.  Cervix: closed, thick  Results for orders placed or performed during the hospital encounter of 04/02/16 (from the past 24 hour(s))  Urinalysis, Routine w reflex microscopic     Status: Abnormal   Collection Time: 04/02/16  1:18 PM  Result Value Ref Range   Color, Urine YELLOW YELLOW   APPearance CLEAR CLEAR   Specific Gravity, Urine 1.023 1.005 - 1.030   pH 7.0 5.0 - 8.0   Glucose, UA NEGATIVE NEGATIVE mg/dL   Hgb urine dipstick NEGATIVE NEGATIVE   Bilirubin Urine NEGATIVE NEGATIVE   Ketones, ur NEGATIVE NEGATIVE mg/dL   Protein, ur NEGATIVE NEGATIVE mg/dL   Nitrite NEGATIVE NEGATIVE   Leukocytes, UA TRACE (A) NEGATIVE   RBC / HPF 0-5 0 - 5 RBC/hpf   WBC, UA 6-30 0 - 5 WBC/hpf  Bacteria, UA RARE (A) NONE SEEN   Squamous Epithelial / LPF 6-30 (A) NONE SEEN   Mucous PRESENT   Pregnancy, urine POC     Status: Abnormal   Collection Time: 04/02/16  1:30 PM  Result Value Ref Range   Preg Test, Ur POSITIVE (A) NEGATIVE  Wet prep, genital     Status: Abnormal   Collection Time: 04/02/16  1:45 PM  Result Value Ref Range   Yeast Wet Prep HPF POC NONE SEEN NONE SEEN   Trich, Wet Prep NONE SEEN NONE SEEN   Clue Cells Wet Prep HPF POC NONE SEEN NONE SEEN   WBC, Wet Prep HPF POC FEW (A) NONE SEEN   Sperm NONE SEEN   CBC with Differential/Platelet     Status:  Abnormal   Collection Time: 04/02/16  2:15 PM  Result Value Ref Range   WBC 8.7 4.0 - 10.5 K/uL   RBC 5.29 (H) 3.87 - 5.11 MIL/uL   Hemoglobin 12.5 12.0 - 15.0 g/dL   HCT 09.839.2 11.936.0 - 14.746.0 %   MCV 74.1 (L) 78.0 - 100.0 fL   MCH 23.6 (L) 26.0 - 34.0 pg   MCHC 31.9 30.0 - 36.0 g/dL   RDW 82.913.6 56.211.5 - 13.015.5 %   Platelets 309 150 - 400 K/uL   Neutrophils Relative % 66 %   Neutro Abs 5.7 1.7 - 7.7 K/uL   Lymphocytes Relative 26 %   Lymphs Abs 2.3 0.7 - 4.0 K/uL   Monocytes Relative 5 %   Monocytes Absolute 0.4 0.1 - 1.0 K/uL   Eosinophils Relative 2 %   Eosinophils Absolute 0.2 0.0 - 0.7 K/uL   Basophils Relative 0 %   Basophils Absolute 0.0 0.0 - 0.1 K/uL  hCG, quantitative, pregnancy     Status: Abnormal   Collection Time: 04/02/16  2:15 PM  Result Value Ref Range   hCG, Beta Chain, Quant, S 878 (H) <5 mIU/mL   [place] Ob Comp Less 14 Wks  Result Date: 04/02/2016 CLINICAL DATA:  21 year old pregnant female presents with spotting and pelvic cramping. Quantitative beta HCG 878. EDC by LMP: 12/06/2016, projecting to an expected gestational age of [redacted] weeks 4 days. EXAM: OBSTETRIC <14 WK US AND TRANSVAGINAL OB US TECHNIQUE: Both transabdominal and transvaginal ultrasound examinations were performed for complete evaluation of the gestation as well as the maternal uterus, adnexal regions, and pelvic cul-de-sac. Transvaginal technique was performed to assess early pregnancy. COMPARISON:  No prior scans from this gestation. FINDINGS: Intrauterine gestational sac: Single tiny intrauterine gestational sac with double decidual sac sign appears normal in position. Yolk sac:  Visualized. Embryo:  Not Visualized. Embryonic Cardiac Activity: Not Visualized. MSD: 4.0  mm   5 w   0  d Maternal uterus/adnexae: There is trace fluid in the endometrial cavity. Anteverted uterus. No uterine fibroids or other myometrial abnormalities demonstrated. Left ovary measures 3.4 x 2.2 x 3.4 cm and contains a corpus luteum. Right  ovary measures 2.6 x 1.2 x 1.4 cm. No abnormal ovarian or adnexal masses. IMPRESSION: 1. Tiny intrauterine gestational sac with yolk sac at 5 weeks 0 days by mean sac diameter. No embryo detected at this time, which could be due to early gestational age. Recommend follow-up US in 11-14 days for definitive diagnosis. This recommendation follows SRU consensus guidelines: Diagnostic Criteria for Nonviable Pregnancy Early in the First Trimester. Malva Limes Engl J Med 2013; 865:7846-96; 369:1443-51. 2. Nonspecific trace fluid in the endometrial cavity. 3. No abnormal ovarian or adnexal masses. Electronically  Signed   By: Delbert Phenix M.D.   On: 04/02/2016 15:16   US Ob Transvaginal  Result Date: 04/02/2016 CLINICAL DATA:  21 year old pregnant female presents with spotting and pelvic cramping. Quantitative beta HCG 878. EDC by LMP: 12/06/2016, projecting to an expected gestational age of [redacted] weeks 4 days. EXAM: OBSTETRIC <14 WK [place] AND TRANSVAGINAL OB US TECHNIQUE: Both transabdominal and transvaginal ultrasound examinations were performed for complete evaluation of the gestation as well as the maternal uterus, adnexal regions, and pelvic cul-de-sac. Transvaginal technique was performed to assess early pregnancy. COMPARISON:  No prior scans from this gestation. FINDINGS: Intrauterine gestational sac: Single tiny intrauterine gestational sac with double decidual sac sign appears normal in position. Yolk sac:  Visualized. Embryo:  Not Visualized. Embryonic Cardiac Activity: Not Visualized. MSD: 4.0  mm   5 w   0  d Maternal uterus/adnexae: There is trace fluid in the endometrial cavity. Anteverted uterus. No uterine fibroids or other myometrial abnormalities demonstrated. Left ovary measures 3.4 x 2.2 x 3.4 cm and contains a corpus luteum. Right ovary measures 2.6 x 1.2 x 1.4 cm. No abnormal ovarian or adnexal masses. IMPRESSION: 1. Tiny intrauterine gestational sac with yolk sac at 5 weeks 0 days by mean sac diameter. No embryo detected at  this time, which could be due to early gestational age. Recommend follow-up US in 11-14 days for definitive diagnosis. This recommendation follows SRU consensus guidelines: Diagnostic Criteria for Nonviable Pregnancy Early in the First Trimester. Malva Limes Med 2013; 952:8413-24. 2. Nonspecific trace fluid in the endometrial cavity. 3. No abnormal ovarian or adnexal masses. Electronically Signed   By: Delbert Phenix M.D.   On: 04/02/2016 15:16    MAU Course  Procedures None  MDM +UPT UA, wet prep, GC/chlamydia, CBC, quant hCG, HIV, RPR and [place] today to rule out ectopic pregnancy A+ blood type in Epic from previous visit Assessment and Plan  A: Pregnancy of unknown location Abdominal pain in pregnancy, first trimester Spotting in pregnancy, antepartum   P: Discharge home Tylenol PRN for pain Ectopic precautions discussed Patient advised to follow-up with [facility] at 11:00 am on Tuesday for repeat STAT labs Patient may return to MAU as needed or if her condition were to change or worsen  Marny Lowenstein, PA-C  04/02/2016, 3:42 PM

## 2016-04-03 LAB — RPR: RPR Ser Ql: NONREACTIVE

## 2016-04-03 LAB — HIV ANTIBODY (ROUTINE TESTING W REFLEX): HIV SCREEN 4TH GENERATION: NONREACTIVE

## 2016-04-04 LAB — GC/CHLAMYDIA PROBE AMP (~~LOC~~) NOT AT ARMC
CHLAMYDIA, DNA PROBE: NEGATIVE
Neisseria Gonorrhea: NEGATIVE

## 2016-04-05 ENCOUNTER — Ambulatory Visit: Payer: BLUE CROSS/BLUE SHIELD

## 2016-04-05 ENCOUNTER — Telehealth: Payer: Self-pay

## 2016-04-05 NOTE — Telephone Encounter (Signed)
Patient was down for a stat beta today of which she miss her appointment. Called patient patient stated she is having some second thoughts about this pregnancy and wish to call back tomorrow to schedule appointment. I advised patient it is very important to call us back regarding making an appointment to follow up with our office.

## 2016-04-08 ENCOUNTER — Other Ambulatory Visit: Payer: BLUE CROSS/BLUE SHIELD

## 2016-04-08 ENCOUNTER — Encounter: Payer: Self-pay | Admitting: *Deleted

## 2016-04-08 DIAGNOSIS — O3680X0 Pregnancy with inconclusive fetal viability, not applicable or unspecified: Secondary | ICD-10-CM

## 2016-04-08 LAB — HCG, QUANTITATIVE, PREGNANCY: hCG, Beta Chain, Quant, S: 2881 m[IU]/mL — ABNORMAL HIGH (ref <5)

## 2016-04-08 NOTE — Progress Notes (Signed)
Patient returns to office today for stat beta. Patient stated she is not having any pain. Discuss results with Dr.Harraway who suggest she have another u/s follow up in two weeks to discuss viability. U/S ordered for 04/22/2016 this appointment has been discuss with patient..Marland Kitchen

## 2016-04-18 ENCOUNTER — Inpatient Hospital Stay (HOSPITAL_COMMUNITY): Payer: BLUE CROSS/BLUE SHIELD

## 2016-04-18 ENCOUNTER — Inpatient Hospital Stay (HOSPITAL_COMMUNITY)
Admission: AD | Admit: 2016-04-18 | Discharge: 2016-04-18 | Disposition: A | Discharge status: Home / Self Care | Payer: BLUE CROSS/BLUE SHIELD | Source: Ambulatory Visit | Attending: Obstetrics and Gynecology | Admitting: Obstetrics and Gynecology

## 2016-04-18 ENCOUNTER — Telehealth: Payer: Self-pay

## 2016-04-18 ENCOUNTER — Encounter (HOSPITAL_COMMUNITY): Payer: Self-pay | Admitting: *Deleted

## 2016-04-18 DIAGNOSIS — O039 Complete or unspecified spontaneous abortion without complication: Secondary | ICD-10-CM | POA: Diagnosis not present

## 2016-04-18 DIAGNOSIS — Z3A01 Less than 8 weeks gestation of pregnancy: Secondary | ICD-10-CM | POA: Diagnosis not present

## 2016-04-18 DIAGNOSIS — O209 Hemorrhage in early pregnancy, unspecified: Secondary | ICD-10-CM

## 2016-04-18 LAB — URINALYSIS, ROUTINE W REFLEX MICROSCOPIC
Bilirubin Urine: NEGATIVE
GLUCOSE, UA: NEGATIVE mg/dL
Ketones, ur: NEGATIVE mg/dL
NITRITE: NEGATIVE
PROTEIN: 100 mg/dL — AB
Specific Gravity, Urine: 1.028 (ref 1.005–1.030)
pH: 5 (ref 5.0–8.0)

## 2016-04-18 LAB — CBC
HCT: 38 % (ref 36.0–46.0)
Hemoglobin: 12.2 g/dL (ref 12.0–15.0)
MCH: 23.6 pg — ABNORMAL LOW (ref 26.0–34.0)
MCHC: 32.1 g/dL (ref 30.0–36.0)
MCV: 73.5 fL — ABNORMAL LOW (ref 78.0–100.0)
Platelets: 330 K/uL (ref 150–400)
RBC: 5.17 MIL/uL — ABNORMAL HIGH (ref 3.87–5.11)
RDW: 13.4 % (ref 11.5–15.5)
WBC: 6 K/uL (ref 4.0–10.5)

## 2016-04-18 LAB — HCG, QUANTITATIVE, PREGNANCY: hCG, Beta Chain, Quant, S: 4084 m[IU]/mL — ABNORMAL HIGH (ref <5)

## 2016-04-18 MED ORDER — KETOROLAC TROMETHAMINE 60 MG/2ML IM SOLN
60.0000 mg | Freq: Once | INTRAMUSCULAR | Status: AC
  Administered 2016-04-18: 60 mg via INTRAMUSCULAR
  Filled 2016-04-18: qty 2

## 2016-04-18 MED ORDER — IBUPROFEN 600 MG PO TABS: 600.0000 mg | ORAL_TABLET | Freq: Four times a day (QID) | ORAL | 0 refills | Status: DC | PRN

## 2016-04-18 NOTE — Telephone Encounter (Signed)
Pt call transferred from the front office and pt informed me that she had an US where she told that she had a miscarriage and later today she had a large blood clot with something white attached.  I informed her that her body is getting rid of products of conception.  To make sure that if she starts having severe pain, fever, or heavy bleeding saturating a pad an hour to please to go to MAU. Per Dr. Adrian BlackwaterStinson, recommendation for pt to come in for follow up beta.  Pt stated that she will be able to come in April 2nd @ 1545 for lab draw.

## 2016-04-18 NOTE — MAU Note (Signed)
Patient states she woke up to dark red vaginal bleeding noted in underwear. States since coming in it is now brown in color when she wipes. Denies any pain at this time/ states she just feels like she has gas. Endorses feeling week and having a decreased appetite. LMP 2/6

## 2016-04-18 NOTE — MAU Provider Note (Signed)
History     CSN: [id number]  Arrival date and time: 04/18/16 [id number]   First Provider Initiated Contact with Patient 04/18/16 1000      Chief Complaint  Patient presents with  . Vaginal Bleeding   HPI   Ms.Debbie Mercer is a 21 y.o. female G2P1001 @ 6483w6d here in MAU with vaginal bleeding. She was seen on 3/10 for spotting and had an US that confirmed IUP. She has a follow up US scheduled for Friday for viability.   She denies pain at this time. Bleeding is similar to a menstrual cycle. She denies dizziness.   OB History    Gravida Para Term Preterm AB Living   2 1 1     1    SAB TAB Ectopic Multiple Live Births           1      Past Medical History:  Diagnosis Date  . Medical history non-contributory     Past Surgical History:  Procedure Laterality Date  . BREAST LUMPECTOMY      Family History  Problem Relation Age of Onset  . Hypertension Mother   . Diabetes Maternal Grandmother     Social History  Substance Use Topics  . Smoking status: Never Smoker  . Smokeless tobacco: Never Used  . Alcohol use No    Allergies:  Allergies  Allergen Reactions  . Shellfish Allergy Anaphylaxis    Prescriptions Prior to Admission  Medication Sig Dispense Refill Last Dose  . Prenatal Vit-Fe Fumarate-FA (PRENATAL MULTIVITAMIN) TABS tablet Take 1 tablet by mouth daily at 12 noon.   04/17/2016 at Unknown time   Results for orders placed or performed during the hospital encounter of 04/18/16 (from the past 48 hour(s))  Urinalysis, Routine w reflex microscopic     Status: Abnormal   Collection Time: 04/18/16  8:58 AM  Result Value Ref Range   Color, Urine YELLOW YELLOW   APPearance CLOUDY (A) CLEAR   Specific Gravity, Urine 1.028 1.005 - 1.030   pH 5.0 5.0 - 8.0   Glucose, UA NEGATIVE NEGATIVE mg/dL   Hgb urine dipstick LARGE (A) NEGATIVE   Bilirubin Urine NEGATIVE NEGATIVE   Ketones, ur NEGATIVE NEGATIVE mg/dL   Protein, ur 536100 (A) NEGATIVE mg/dL   Nitrite NEGATIVE  NEGATIVE   Leukocytes, UA TRACE (A) NEGATIVE   RBC / HPF TOO NUMEROUS TO COUNT 0 - 5 RBC/hpf   WBC, UA TOO NUMEROUS TO COUNT 0 - 5 WBC/hpf   Bacteria, UA FEW (A) NONE SEEN   Squamous Epithelial / LPF 6-30 (A) NONE SEEN   WBC Clumps PRESENT    Mucous PRESENT   hCG, quantitative, pregnancy     Status: Abnormal   Collection Time: 04/18/16 12:50 PM  Result Value Ref Range   hCG, Beta Chain, Quant, S 4,084 (H) <5 mIU/mL    Comment:          GEST. AGE      CONC.  (mIU/mL)   <=1 WEEK        5 - 50     2 WEEKS       50 - 500     3 WEEKS       100 - 10,000     4 WEEKS     1,000 - 30,000     5 WEEKS     3,500 - 115,000   6-8 WEEKS     12,000 - 270,000    12 WEEKS  15,000 - 220,000        FEMALE AND NON-PREGNANT FEMALE:     LESS THAN 5 mIU/mL   CBC     Status: Abnormal   Collection Time: 04/18/16 12:50 PM  Result Value Ref Range   WBC 6.0 4.0 - 10.5 K/uL   RBC 5.17 (H) 3.87 - 5.11 MIL/uL   Hemoglobin 12.2 12.0 - 15.0 g/dL   HCT 16.1 09.6 - 04.5 %   MCV 73.5 (L) 78.0 - 100.0 fL   MCH 23.6 (L) 26.0 - 34.0 pg   MCHC 32.1 30.0 - 36.0 g/dL   RDW 40.9 81.1 - 91.4 %   Platelets 330 150 - 400 K/uL   US Ob Transvaginal  Result Date: 04/18/2016 CLINICAL DATA:  Vaginal bleeding in pregnancy EXAM: TRANSVAGINAL OB ULTRASOUND TECHNIQUE: Transvaginal ultrasound was performed for complete evaluation of the gestation as well as the maternal uterus, adnexal regions, and pelvic cul-de-sac. COMPARISON:  04/02/2016 FINDINGS: Intrauterine gestational sac: None visualized Yolk sac:  None visualized Embryo:  None visualized Cardiac Activity: Heart Rate:  bpm MSD:   mm    w     d CRL:     mm    w  d                  [place] EDC: Subchorionic hemorrhage:  None visualized. Maternal uterus/adnexae: No adnexal masses.  No free fluid. IMPRESSION: Previously seen small intrauterine gestational sac is no longer visualized. No intrauterine gestation on today's study. Electronically Signed   By: Charlett Nose M.D.   On:  04/18/2016 11:30   Review of Systems  Gastrointestinal: Positive for abdominal pain.  Genitourinary: Positive for vaginal bleeding. Negative for dysuria.  Neurological: Negative for dizziness.   Physical Exam   Blood pressure 105/66, pulse 86, temperature 99.6 F (37.6 C), temperature source Oral, resp. rate 18, weight 178 lb 0.6 oz (80.8 kg), last menstrual period 03/01/2016, SpO2 100 %, currently breastfeeding.  Physical Exam  Constitutional: She is oriented to person, place, and time. She appears well-developed and well-nourished. No distress.  HENT:  Head: Normocephalic.  Eyes: Pupils are equal, round, and reactive to light.  Neck: Neck supple.  Genitourinary:  Genitourinary Comments: Cervix: slightly open, small amount of dark red blood noted on exam glove.   Musculoskeletal: Normal range of motion.  Neurological: She is alert and oriented to person, place, and time.  Skin: Skin is warm. She is not diaphoretic.  Psychiatric: Her behavior is normal.    MAU Course  Procedures  None  MDM   A positive blood type.  Repeat US today> which shows complete SAB, previous IUP and yolk sac no longer visible.  Quant >4000 will plan to bring back to [facility] for repeat quant.  Toradol given IM pain down from 4/10 to 0/10   Assessment and Plan   A:  1. SAB (spontaneous abortion)   2. Vaginal bleeding in pregnancy, first trimester     P:  Discharge home in stable condition Message sent to the [facility] for repeat quant in 1 week Bleeding precautions  Return to MAU if symptoms worsen Rx: Ibuprofen   Duane Lope, NP 04/18/2016 5:47 PM

## 2016-04-18 NOTE — Discharge Instructions (Signed)

## 2016-04-18 NOTE — Progress Notes (Signed)
Pt verbalizes understanding of d/c instructions, medications, follow up appts, when to seek medical attention. No questions at this time. Was given a work note for today as well as instructions and a miscarriage comfort packet. Pt ambulated out, her SO is taking her home. Sheryn BisonGordon, Meloni Hinz Warner

## 2016-04-22 ENCOUNTER — Ambulatory Visit (HOSPITAL_COMMUNITY): Admission: RE | Admit: 2016-04-22 | Payer: BLUE CROSS/BLUE SHIELD | Source: Ambulatory Visit

## 2016-04-25 ENCOUNTER — Other Ambulatory Visit: Payer: BLUE CROSS/BLUE SHIELD

## 2016-04-25 DIAGNOSIS — O3680X0 Pregnancy with inconclusive fetal viability, not applicable or unspecified: Secondary | ICD-10-CM

## 2016-04-26 ENCOUNTER — Telehealth: Payer: Self-pay | Admitting: *Deleted

## 2016-04-26 LAB — BETA HCG QUANT (REF LAB): HCG QUANT: 49 m[IU]/mL

## 2016-04-26 NOTE — Telephone Encounter (Signed)
Called pt and heard message stating that the person called has a voice mailbox which has not been set up yet - unable to leave a message.  Per Dr. Vergie Living, pt needs to be informed that she will need BHCG test every 7-10 days until the result is zero. On 4/2, result was 49.

## 2016-04-27 NOTE — Telephone Encounter (Signed)
Called patient and informed her of test results. She has already scheduled another lab visit on 4/11.

## 2016-05-04 ENCOUNTER — Ambulatory Visit: Payer: BLUE CROSS/BLUE SHIELD | Admitting: General Practice

## 2016-05-04 DIAGNOSIS — O039 Complete or unspecified spontaneous abortion without complication: Secondary | ICD-10-CM

## 2016-05-04 NOTE — Progress Notes (Signed)
Patient here for lab only visit. Not seen by nurse

## 2016-05-05 ENCOUNTER — Telehealth: Payer: Self-pay | Admitting: *Deleted

## 2016-05-05 ENCOUNTER — Encounter: Payer: Self-pay | Admitting: *Deleted

## 2016-05-05 LAB — BETA HCG QUANT (REF LAB): HCG QUANT: 3 m[IU]/mL

## 2016-05-05 NOTE — Telephone Encounter (Signed)
Per Dr Vergie Living, contacted patient with b-hcg results. And let her know there was no more f/u requires. Understanding was voiced.

## 2016-05-13 ENCOUNTER — Encounter (HOSPITAL_COMMUNITY): Payer: Self-pay | Admitting: *Deleted

## 2016-05-13 ENCOUNTER — Inpatient Hospital Stay (HOSPITAL_COMMUNITY)
Admission: AD | Admit: 2016-05-13 | Discharge: 2016-05-13 | Disposition: A | Discharge status: Home / Self Care | Payer: BLUE CROSS/BLUE SHIELD | Source: Ambulatory Visit | Attending: Family Medicine | Admitting: Family Medicine

## 2016-05-13 DIAGNOSIS — R1032 Left lower quadrant pain: Secondary | ICD-10-CM

## 2016-05-13 DIAGNOSIS — R1030 Lower abdominal pain, unspecified: Secondary | ICD-10-CM | POA: Diagnosis not present

## 2016-05-13 DIAGNOSIS — R102 Pelvic and perineal pain: Secondary | ICD-10-CM | POA: Diagnosis present

## 2016-05-13 LAB — POCT PREGNANCY, URINE: Preg Test, Ur: NEGATIVE

## 2016-05-13 LAB — URINALYSIS, ROUTINE W REFLEX MICROSCOPIC
Bilirubin Urine: NEGATIVE
Glucose, UA: NEGATIVE mg/dL
Hgb urine dipstick: NEGATIVE
KETONES UR: NEGATIVE mg/dL
Nitrite: NEGATIVE
Protein, ur: NEGATIVE mg/dL
Specific Gravity, Urine: 1.025 (ref 1.005–1.030)
pH: 6 (ref 5.0–8.0)

## 2016-05-13 LAB — CBC WITH DIFFERENTIAL/PLATELET
Basophils Absolute: 0 10*3/uL (ref 0.0–0.1)
Basophils Relative: 1 %
EOS ABS: 0.1 10*3/uL (ref 0.0–0.7)
Eosinophils Relative: 2 %
HCT: 39.9 % (ref 36.0–46.0)
Hemoglobin: 12.6 g/dL (ref 12.0–15.0)
LYMPHS ABS: 2.8 10*3/uL (ref 0.7–4.0)
LYMPHS PCT: 34 %
MCH: 23.6 pg — AB (ref 26.0–34.0)
MCHC: 31.6 g/dL (ref 30.0–36.0)
MCV: 74.7 fL — AB (ref 78.0–100.0)
MONO ABS: 0.4 10*3/uL (ref 0.1–1.0)
MONOS PCT: 5 %
Neutro Abs: 4.8 10*3/uL (ref 1.7–7.7)
Neutrophils Relative %: 59 %
Platelets: 280 10*3/uL (ref 150–400)
RBC: 5.34 MIL/uL — AB (ref 3.87–5.11)
RDW: 13.8 % (ref 11.5–15.5)
WBC: 8.2 10*3/uL (ref 4.0–10.5)

## 2016-05-13 LAB — WET PREP, GENITAL
CLUE CELLS WET PREP: NONE SEEN
Sperm: NONE SEEN
TRICH WET PREP: NONE SEEN
YEAST WET PREP: NONE SEEN

## 2016-05-13 MED ORDER — NAPROXEN SODIUM 550 MG PO TABS: 550.0000 mg | ORAL_TABLET | Freq: Two times a day (BID) | ORAL | 0 refills | Status: DC

## 2016-05-13 MED ORDER — KETOROLAC TROMETHAMINE 60 MG/2ML IM SOLN: 30.0000 mg | Freq: Once | INTRAMUSCULAR | Status: DC

## 2016-05-13 NOTE — MAU Provider Note (Signed)
[facility] AT [facility]    Provider Note   CSN: [id number] Arrival date & time: 05/13/16  1419     History   Chief Complaint Chief Complaint  Patient presents with  . Pelvic Pain    HPI Debbie Mercer is a 21 y.o. G2P1011 who presents to the MAU with vaginal discharge and left side pain that is mild and comes and goes. Patient reports that she had a miscarriage 04/18/16 and had bleeding and passed clots for 4 days. She reports that she has not had a period since the miscarriage. She states she just wants to be sure she is not pregnant again and that everything is ok. HPI  Past Medical History:  Diagnosis Date  . Medical history non-contributory     Patient Active Problem List   Diagnosis Date Noted  . Fever 03/18/2013  . NSVD (normal spontaneous vaginal delivery) 03/15/2013  . Irregular contractions 03/12/2013  . 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 GERD (gastroesophageal reflux disease) 03/05/2013  . LGA (large for gestational age) fetus-->90%ile at 55 5/7 weeks. 03/05/2013    Past Surgical History:  Procedure Laterality Date  . BREAST LUMPECTOMY      OB History    Gravida Para Term Preterm AB Living   SAB TAB Ectopic Multiple Live Births   1       1       Home Medications    Prior to Admission medications   Medication Sig Start Date End Date Taking? Authorizing Provider  ibuprofen (ADVIL,MOTRIN) 600 MG tablet Take 1 tablet (600 mg total) by mouth every 6 (six) hours as needed. 04/18/16  Yes Duane Lope, NP  naproxen sodium (ANAPROX DS) 550 MG tablet Take 1 tablet (550 mg total) by mouth 2 (two) times daily with a meal. 05/13/16   Hope Orlene Och, NP    Family History Family History  Problem Relation Age of Onset  . Hypertension Mother   . Diabetes Maternal Grandmother     Social History Social History  Substance Use Topics  . Smoking status: Never Smoker  . Smokeless  tobacco: Never Used  . Alcohol use No     Allergies   Shellfish allergy   Review of Systems Review of Systems  Constitutional: Negative for chills and fever.  HENT: Negative.   Gastrointestinal: Positive for abdominal pain. Negative for nausea and vomiting.  Genitourinary: Positive for vaginal discharge. Negative for decreased urine volume, dysuria, frequency and vaginal bleeding.  Skin: Negative for rash.  Neurological: Negative for syncope.  Psychiatric/Behavioral: The patient is not nervous/anxious.      Physical Exam Updated Vital Signs BP 115/78   Pulse 77   Temp 98.7 F (37.1 C)   Resp 18   Physical Exam  Constitutional: She is oriented to person, place, and time. She appears well-developed and well-nourished. No distress.  HENT:  Head: Normocephalic and atraumatic.  Eyes: EOM are normal.  Neck: Neck supple.  Cardiovascular: Normal rate.   Pulmonary/Chest: Effort normal.  Abdominal: Soft. There is tenderness in the right lower quadrant. There is no CVA tenderness.  Pain is minimal  Genitourinary:  Genitourinary Comments: External genitalia without lesions, mucous d/c vaginal vault. No CMT, mild left adnexal tenderness. Uterus without palpable enlargement.   Musculoskeletal: Normal range of motion.  Neurological: She is alert and oriented to person, place, and time. No cranial nerve  deficit.  Skin: Skin is warm and dry.  Psychiatric: She has a normal mood and affect. Her behavior is normal.  Nursing note and vitals reviewed.    ED Treatments / Results  Labs (all labs ordered are listed, but only abnormal results are displayed) Labs Reviewed  WET PREP, GENITAL - Abnormal; Notable for the following:       Result Value   WBC, Wet Prep HPF POC MODERATE (*)    All other components within normal limits  URINALYSIS, ROUTINE W REFLEX MICROSCOPIC - Abnormal; Notable for the following:    Leukocytes, UA TRACE (*)    Bacteria, UA RARE (*)    Squamous Epithelial /  LPF 0-5 (*)    All other components within normal limits  CBC WITH DIFFERENTIAL/PLATELET - Abnormal; Notable for the following:    RBC 5.34 (*)    MCV 74.7 (*)    MCH 23.6 (*)    All other components within normal limits  RPR  POCT PREGNANCY, URINE  GC/CHLAMYDIA PROBE AMP ([place]) NOT AT [facility]   Radiology No results found.  Procedures Procedures (including critical care time)  Medications Ordered in ED Medications - No data to display   Initial Impression / Assessment and Plan / ED Course  I have reviewed the triage vital signs and the nursing notes.  Pertinent lab results that were available during my care of the patient were reviewed by me and considered in my medical decision making (see chart for details).   Final Clinical Impressions(s) / ED Diagnoses  21 y.o. female with lower abdominal pain that has been off and on for the past couple days stable for d/c without pain at this time. Will treat with NSAIDS and she will return if symptoms worsen.  Final diagnoses:  LLQ pain    New Prescriptions Current Discharge Medication List    START taking these medications   Details  naproxen sodium (ANAPROX DS) 550 MG tablet Take 1 tablet (550 mg total) by mouth 2 (two) times daily with a meal. Qty: 20 tablet, Refills: 0

## 2016-05-13 NOTE — MAU Note (Signed)
Pt reports she started having some pelvic discomfort a week ago. Thought she was ovulating. Pain has changed has had gotten sharper and more intense mostly on left side. c/o clear water vaginal discharge as well. Pt miscarried on 3/26. Has not had period since.

## 2016-05-14 LAB — RPR: RPR Ser Ql: NONREACTIVE

## 2016-05-16 LAB — GC/CHLAMYDIA PROBE AMP (~~LOC~~) NOT AT ARMC
CHLAMYDIA, DNA PROBE: NEGATIVE
NEISSERIA GONORRHEA: NEGATIVE

## 2016-06-29 ENCOUNTER — Encounter (HOSPITAL_COMMUNITY): Payer: Self-pay | Admitting: Emergency Medicine

## 2016-06-29 ENCOUNTER — Emergency Department (HOSPITAL_COMMUNITY)
Admission: EM | Admit: 2016-06-29 | Discharge: 2016-06-29 | Disposition: A | Discharge status: Home / Self Care | Payer: BLUE CROSS/BLUE SHIELD | Attending: Emergency Medicine | Admitting: Emergency Medicine

## 2016-06-29 DIAGNOSIS — Z79899 Other long term (current) drug therapy: Secondary | ICD-10-CM | POA: Insufficient documentation

## 2016-06-29 DIAGNOSIS — Z3201 Encounter for pregnancy test, result positive: Secondary | ICD-10-CM | POA: Insufficient documentation

## 2016-06-29 DIAGNOSIS — R110 Nausea: Secondary | ICD-10-CM | POA: Diagnosis not present

## 2016-06-29 DIAGNOSIS — N898 Other specified noninflammatory disorders of vagina: Secondary | ICD-10-CM | POA: Diagnosis not present

## 2016-06-29 LAB — URINALYSIS, ROUTINE W REFLEX MICROSCOPIC
Bilirubin Urine: NEGATIVE
Glucose, UA: NEGATIVE mg/dL
Hgb urine dipstick: NEGATIVE
KETONES UR: NEGATIVE mg/dL
Nitrite: NEGATIVE
PROTEIN: NEGATIVE mg/dL
Specific Gravity, Urine: 1.018 (ref 1.005–1.030)
pH: 8 (ref 5.0–8.0)

## 2016-06-29 LAB — WET PREP, GENITAL
Clue Cells Wet Prep HPF POC: NONE SEEN
Sperm: NONE SEEN
TRICH WET PREP: NONE SEEN
YEAST WET PREP: NONE SEEN

## 2016-06-29 LAB — HCG, QUANTITATIVE, PREGNANCY: hCG, Beta Chain, Quant, S: 17908 m[IU]/mL — ABNORMAL HIGH (ref <5)

## 2016-06-29 LAB — POC URINE PREG, ED: Preg Test, Ur: POSITIVE — AB

## 2016-06-29 MED ORDER — AZITHROMYCIN 250 MG PO TABS
1000.0000 mg | ORAL_TABLET | Freq: Once | ORAL | Status: AC
  Administered 2016-06-29: 1000 mg via ORAL
  Filled 2016-06-29: qty 4

## 2016-06-29 MED ORDER — CEFTRIAXONE SODIUM 250 MG IJ SOLR
250.0000 mg | Freq: Once | INTRAMUSCULAR | Status: AC
  Administered 2016-06-29: 250 mg via INTRAMUSCULAR
  Filled 2016-06-29: qty 250

## 2016-06-29 MED ORDER — LIDOCAINE HCL (PF) 1 % IJ SOLN
5.0000 mL | Freq: Once | INTRAMUSCULAR | Status: AC
  Administered 2016-06-29: 5 mL
  Filled 2016-06-29: qty 5

## 2016-06-29 MED ORDER — ONDANSETRON 4 MG PO TBDP
4.0000 mg | ORAL_TABLET | Freq: Once | ORAL | Status: AC
  Administered 2016-06-29: 4 mg via ORAL
  Filled 2016-06-29: qty 1

## 2016-06-29 MED ORDER — PRENATAL COMPLETE 14-0.4 MG PO TABS: 1.0000 | ORAL_TABLET | Freq: Every day | ORAL | 0 refills | Status: DC

## 2016-06-29 MED ORDER — FAMOTIDINE 20 MG PO TABS: 20.0000 mg | ORAL_TABLET | Freq: Every day | ORAL | 0 refills | Status: DC

## 2016-06-29 MED ORDER — ONDANSETRON 4 MG PO TBDP: 4.0000 mg | ORAL_TABLET | Freq: Three times a day (TID) | ORAL | 0 refills | Status: DC | PRN

## 2016-06-29 NOTE — Discharge Instructions (Signed)
Schedule an appointment at the [facility] clinic for follow-up. Return to ER or [facility] if you have any vaginal bleeding, abdominal pain, or fever. Have all partners tested and treated for any STD prior to any intercourse. Start taking prenatal vitamin daily. Take tylenol for pain as needed, avoid ibuprofen/motrin.

## 2016-06-29 NOTE — ED Provider Notes (Signed)
[facility]-EMERGENCY DEPT Provider Note   CSN: [id number] Arrival date & time: 06/29/16  4098     History   Chief Complaint Chief Complaint  Patient presents with  . Nausea    HPI Debbie Mercer is a 21 y.o. female.  21yo F [id number] who p/w nausea. She reports 4 days of nausea associated with heartburn and decreased PO intake, no associated vomiting or diarrhea. She had mild constipation with last BM yesterday. She reports abdominal bloating but no sharp abdominal pain. No fevers, urinary symptoms, abnormal vaginal discharge or bleeding. LMP was at end of April. Sexually active with 1 partner, no protection. She has not tried any medications for her symptoms.    The history is provided by the patient.    Past Medical History:  Diagnosis Date  . Medical history non-contributory     Patient Active Problem List   Diagnosis Date Noted  . Fever 03/18/2013  . NSVD (normal spontaneous vaginal delivery) 03/15/2013  . Irregular contractions 03/12/2013  . 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 GERD (gastroesophageal reflux disease) 03/05/2013  . LGA (large for gestational age) fetus-->90%ile at 59 5/7 weeks. 03/05/2013    Past Surgical History:  Procedure Laterality Date  . BREAST LUMPECTOMY      OB History    Gravida Para Term Preterm AB Living   2 1 1   1 1    SAB TAB Ectopic Multiple Live Births   1       1       Home Medications    Prior to Admission medications   Medication Sig Start Date End Date Taking? Authorizing Provider  ibuprofen (ADVIL,MOTRIN) 600 MG tablet Take 600 mg by mouth every 6 (six) hours as needed for pain.   Yes [provider]  famotidine (PEPCID) 20 MG tablet Take 1 tablet (20 mg total) by mouth daily. 06/29/16   Juwon Scripter, Ambrose Finland, MD  naproxen sodium (ANAPROX DS) 550 MG tablet Take 1 tablet (550 mg total) by mouth 2 (two) times daily with a meal. Patient not taking: Reported  on 06/29/2016 05/13/16   Janne Napoleon, NP  ondansetron (ZOFRAN ODT) 4 MG disintegrating tablet Take 1 tablet (4 mg total) by mouth every 8 (eight) hours as needed for nausea or vomiting. 06/29/16   Gualberto Wahlen, Ambrose Finland, MD  Prenatal Vit-Fe Fumarate-FA (PRENATAL COMPLETE) 14-0.4 MG TABS Take 1 tablet by mouth daily. 06/29/16   Trisha Morandi, Ambrose Finland, MD    Family History Family History  Problem Relation Age of Onset  . Hypertension Mother   . Diabetes Maternal Grandmother     Social History Social History  Substance Use Topics  . Smoking status: Never Smoker  . Smokeless tobacco: Never Used  . Alcohol use No     Allergies   Shellfish allergy   Review of Systems Review of Systems 10 Systems reviewed and are negative for acute change except as noted in the HPI.   Physical Exam Updated Vital Signs BP 106/68 (BP Location: Left Arm)   Pulse 65   Temp 98.9 F (37.2 C) (Oral)   Resp 16   Ht 5\' 3"  (1.6 m)   Wt 79.4 kg (175 lb)   LMP 05/24/2016   SpO2 100%   BMI 31.00 kg/m   Physical Exam  Constitutional: She is oriented to person, place, and time. She appears well-developed and well-nourished. No distress.  HENT:  Head: Normocephalic and atraumatic.  Moist mucous membranes  Eyes: Conjunctivae are normal. Pupils are equal, round, and reactive to light.  Neck: Neck supple.  Cardiovascular: Normal rate, regular rhythm and normal heart sounds.   No murmur heard. Pulmonary/Chest: Effort normal and breath sounds normal.  Abdominal: Soft. Bowel sounds are normal. She exhibits no distension. There is no tenderness.  Genitourinary:  Genitourinary Comments: Small amount of white discharge in vaginal vault, no cervical motion or adnexal tenderness  Musculoskeletal: She exhibits no edema.  Neurological: She is alert and oriented to person, place, and time.  Fluent speech  Skin: Skin is warm and dry.  Psychiatric: She has a normal mood and affect. Judgment normal.  Nursing note and  vitals reviewed.  Chaperone was present during exam.   ED Treatments / Results  Labs (all labs ordered are listed, but only abnormal results are displayed) Labs Reviewed  WET PREP, GENITAL - Abnormal; Notable for the following:       Result Value   WBC, Wet Prep HPF POC MANY (*)    All other components within normal limits  URINALYSIS, ROUTINE W REFLEX MICROSCOPIC - Abnormal; Notable for the following:    APPearance CLOUDY (*)    Leukocytes, UA MODERATE (*)    Bacteria, UA MANY (*)    Squamous Epithelial / LPF 6-30 (*)    All other components within normal limits  HCG, QUANTITATIVE, PREGNANCY - Abnormal; Notable for the following:    hCG, Beta Chain, Quant, S 17,908 (*)    All other components within normal limits  POC URINE PREG, ED - Abnormal; Notable for the following:    Preg Test, Ur POSITIVE (*)    All other components within normal limits  URINE CULTURE  RPR  HIV ANTIBODY (ROUTINE TESTING)  GC/CHLAMYDIA PROBE AMP ([place]) NOT AT [facility]    EKG  EKG Interpretation None       Radiology No results found.  Procedures Procedures (including critical care time)  EMERGENCY DEPARTMENT [place] PREGNANCY "Study: Limited Ultrasound of the Pelvis for Pregnancy"  INDICATIONS:nausea  Multiple views of the uterus and pelvic cavity were obtained in real-time with a multi-frequency probe.  APPROACH:transabdominal PERFORMED BY: Myself IMAGES ARCHIVED?: Yes LIMITATIONS: none PREGNANCY FREE FLUID: None ADNEXAL FINDINGS: not seen GESTATIONAL AGE, ESTIMATE: too small to estimate FETAL HEART RATE: too early to estimate INTERPRETATION: thickened endometrium with possible small early fetal pole, too small to measure on transabdominal US     Medications Ordered in ED Medications  ondansetron (ZOFRAN-ODT) disintegrating tablet 4 mg (4 mg Oral Given 06/29/16 0828)  cefTRIAXone (ROCEPHIN) injection 250 mg (250 mg Intramuscular Given 06/29/16 1229)  azithromycin (ZITHROMAX) tablet  1,000 mg (1,000 mg Oral Given 06/29/16 1228)  lidocaine (PF) (XYLOCAINE) 1 % injection 5 mL (5 mLs Other Given 06/29/16 1229)     Initial Impression / Assessment and Plan / ED Course  I have reviewed the triage vital signs and the nursing notes.  Pertinent labs & imaging results that were available during my care of the patient were reviewed by me and considered in my medical decision making (see chart for details).     PT p/w nausea x 4 days, no associated abdominal pain, vaginal bleeding, or other complaints. Well appearing w/ no abd tenderness. UPT positive. Chart shows she is Rh+. Labs show UA w/ moderate leuks, no WBC/RBC, squamous cells and bacteria. Culture added but patient without urinary sx. pelvic ultrasound shows small amount of vaginal discharge and wet prep has many white blood cells  with no clue cells, I suspect possible STD. I suspect bacteria from UA may be related to vaginal discharge. Gave ceftriaxone and azithromycin.   HCG is 18,000. I attempted bedside ultrasound transabdominally which shows thickened endometrium and findings consistent with early pregnancy although I was not able to measure a fetal pole as it was too small. The patient denies any abdominal pain and has no abdominal tenderness on exam therefore I feel she is safe to follow-up routinely with OB/GYN. I did provide with Zofran, Pepcid, and prenatal vitamins. The patient has voiced understanding of return precautions including abdominal pain, vaginal bleeding, fever, or intractable vomiting. Patient voiced understanding and was discharged in satisfactory condition. Final Clinical Impressions(s) / ED Diagnoses   Final diagnoses:  Positive pregnancy test  Nausea without vomiting  Vaginal discharge    New Prescriptions Discharge Medication List as of 06/29/2016 12:08 PM    START taking these medications   Details  famotidine (PEPCID) 20 MG tablet Take 1 tablet (20 mg total) by mouth daily., Starting Wed 06/29/2016,  Print    ondansetron (ZOFRAN ODT) 4 MG disintegrating tablet Take 1 tablet (4 mg total) by mouth every 8 (eight) hours as needed for nausea or vomiting., Starting Wed 06/29/2016, Print    Prenatal Vit-Fe Fumarate-FA (PRENATAL COMPLETE) 14-0.4 MG TABS Take 1 tablet by mouth daily., Starting Wed 06/29/2016, Print         Shenay Torti, Ambrose Finlandachel Morgan, MD 06/29/16 [phone]

## 2016-06-29 NOTE — ED Notes (Addendum)
Pt from home with c/o of nausea for the last 4 days with upper GI bloating and lower abdominal cramping.  Pt denies emesis or diarrhea. Denies any urinary symptoms. NAD, A&O.

## 2016-06-30 ENCOUNTER — Encounter (HOSPITAL_COMMUNITY): Payer: Self-pay | Admitting: *Deleted

## 2016-06-30 ENCOUNTER — Inpatient Hospital Stay (HOSPITAL_COMMUNITY)
Admission: AD | Admit: 2016-06-30 | Discharge: 2016-06-30 | Disposition: A | Discharge status: Home / Self Care | Payer: BLUE CROSS/BLUE SHIELD | Source: Ambulatory Visit | Attending: Family Medicine | Admitting: Family Medicine

## 2016-06-30 ENCOUNTER — Inpatient Hospital Stay (HOSPITAL_COMMUNITY): Payer: BLUE CROSS/BLUE SHIELD

## 2016-06-30 DIAGNOSIS — O219 Vomiting of pregnancy, unspecified: Secondary | ICD-10-CM

## 2016-06-30 DIAGNOSIS — O26899 Other specified pregnancy related conditions, unspecified trimester: Secondary | ICD-10-CM

## 2016-06-30 DIAGNOSIS — R112 Nausea with vomiting, unspecified: Secondary | ICD-10-CM | POA: Insufficient documentation

## 2016-06-30 DIAGNOSIS — R109 Unspecified abdominal pain: Secondary | ICD-10-CM | POA: Diagnosis not present

## 2016-06-30 DIAGNOSIS — Z3A01 Less than 8 weeks gestation of pregnancy: Secondary | ICD-10-CM | POA: Insufficient documentation

## 2016-06-30 DIAGNOSIS — O26891 Other specified pregnancy related conditions, first trimester: Secondary | ICD-10-CM | POA: Insufficient documentation

## 2016-06-30 DIAGNOSIS — Z3491 Encounter for supervision of normal pregnancy, unspecified, first trimester: Secondary | ICD-10-CM

## 2016-06-30 DIAGNOSIS — O9989 Other specified diseases and conditions complicating pregnancy, childbirth and the puerperium: Secondary | ICD-10-CM

## 2016-06-30 LAB — RPR: RPR Ser Ql: NONREACTIVE

## 2016-06-30 LAB — BASIC METABOLIC PANEL
ANION GAP: 10 (ref 5–15)
BUN: 6 mg/dL (ref 6–20)
CHLORIDE: 102 mmol/L (ref 101–111)
CO2: 21 mmol/L — AB (ref 22–32)
CREATININE: 0.58 mg/dL (ref 0.44–1.00)
Calcium: 9.2 mg/dL (ref 8.9–10.3)
GFR calc non Af Amer: >60 mL/min (ref >60)
Glucose, Bld: 77 mg/dL (ref 65–99)
Potassium: 3.6 mmol/L (ref 3.5–5.1)
Sodium: 133 mmol/L — ABNORMAL LOW (ref 135–145)

## 2016-06-30 LAB — CBC
HCT: 39 % (ref 36.0–46.0)
Hemoglobin: 12.7 g/dL (ref 12.0–15.0)
MCH: 23.7 pg — ABNORMAL LOW (ref 26.0–34.0)
MCHC: 32.6 g/dL (ref 30.0–36.0)
MCV: 72.9 fL — ABNORMAL LOW (ref 78.0–100.0)
Platelets: 312 10*3/uL (ref 150–400)
RBC: 5.35 MIL/uL — ABNORMAL HIGH (ref 3.87–5.11)
RDW: 13.6 % (ref 11.5–15.5)
WBC: 9.5 10*3/uL (ref 4.0–10.5)

## 2016-06-30 LAB — URINALYSIS, ROUTINE W REFLEX MICROSCOPIC
Bilirubin Urine: NEGATIVE
GLUCOSE, UA: NEGATIVE mg/dL
Hgb urine dipstick: NEGATIVE
Ketones, ur: 20 mg/dL — AB
LEUKOCYTES UA: NEGATIVE
Nitrite: NEGATIVE
PROTEIN: NEGATIVE mg/dL
SPECIFIC GRAVITY, URINE: 1.025 (ref 1.005–1.030)
pH: 6 (ref 5.0–8.0)

## 2016-06-30 LAB — URINE CULTURE: CULTURE: NO GROWTH

## 2016-06-30 LAB — HIV ANTIBODY (ROUTINE TESTING W REFLEX): HIV Screen 4th Generation wRfx: NONREACTIVE

## 2016-06-30 LAB — GC/CHLAMYDIA PROBE AMP (~~LOC~~) NOT AT ARMC
CHLAMYDIA, DNA PROBE: NEGATIVE
Neisseria Gonorrhea: NEGATIVE

## 2016-06-30 MED ORDER — PROMETHAZINE HCL 25 MG/ML IJ SOLN
25.0000 mg | Freq: Once | INTRAMUSCULAR | Status: AC
  Administered 2016-06-30: 25 mg via INTRAVENOUS
  Filled 2016-06-30: qty 1

## 2016-06-30 MED ORDER — PROMETHAZINE HCL 25 MG PO TABS: 25.0000 mg | ORAL_TABLET | Freq: Four times a day (QID) | ORAL | 0 refills | Status: DC | PRN

## 2016-06-30 MED ORDER — DEXTROSE 5 % IN LACTATED RINGERS IV BOLUS
1000.0000 mL | Freq: Once | INTRAVENOUS | Status: AC
  Administered 2016-06-30: 1000 mL via INTRAVENOUS

## 2016-06-30 NOTE — MAU Note (Signed)
Pt was told that she was pregnant yesterday; c/o N&V and loss of appetite; denies bleeding but states that she is cramping;

## 2016-06-30 NOTE — MAU Note (Signed)
[facility] unable to eat. thought she was dehydrated. Was told she was pregnant. Did a pelvic exam and they said that she had a lot of WGC's and gave her 4 pills and a shot. Since she took medication she had had diarrhea and nausea and some abd cramping.

## 2016-06-30 NOTE — Discharge Instructions (Signed)
Morning Sickness °Morning sickness is when you feel sick to your stomach (nauseous) during pregnancy. This nauseous feeling may or may not come with vomiting. It often occurs in the morning but can be a problem any time of day. Morning sickness is most common during the first trimester, but it may continue throughout pregnancy. While morning sickness is unpleasant, it is usually harmless unless you develop severe and continual vomiting (hyperemesis gravidarum). This condition requires more intense treatment. °What are the causes? °The cause of morning sickness is not completely known but seems to be related to normal hormonal changes that occur in pregnancy. °What increases the risk? °You are at greater risk if you: °· Experienced nausea or vomiting before your pregnancy. °· Had morning sickness during a previous pregnancy. °· Are pregnant with more than one baby, such as twins. ° °How is this treated? °Do not use any medicines (prescription, over-the-counter, or herbal) for morning sickness without first talking to your health care provider. Your health care provider may prescribe or recommend: °· Vitamin B6 supplements. °· Anti-nausea medicines. °· The herbal medicine ginger. ° °Follow these instructions at home: °· Only take over-the-counter or prescription medicines as directed by your health care provider. °· Taking multivitamins before getting pregnant can prevent or decrease the severity of morning sickness in most women. °· Eat a piece of dry toast or unsalted crackers before getting out of bed in the morning. °· Eat five or six small meals a day. °· Eat dry and bland foods (rice, baked potato). Foods high in carbohydrates are often helpful. °· Do not drink liquids with your meals. Drink liquids between meals. °· Avoid greasy, fatty, and spicy foods. °· Get someone to cook for you if the smell of any food causes nausea and vomiting. °· If you feel nauseous after taking prenatal vitamins, take the vitamins at  night or with a snack. °· Snack on protein foods (nuts, yogurt, cheese) between meals if you are hungry. °· Eat unsweetened gelatins for desserts. °· Wearing an acupressure wristband (worn for sea sickness) may be helpful. °· Acupuncture may be helpful. °· Do not smoke. °· Get a humidifier to keep the air in your house free of odors. °· Get plenty of fresh air. °Contact a health care provider if: °· Your home remedies are not working, and you need medicine. °· You feel dizzy or lightheaded. °· You are losing weight. °Get help right away if: °· You have persistent and uncontrolled nausea and vomiting. °· You pass out (faint). °This information is not intended to replace advice given to you by your health care provider. Make sure you discuss any questions you have with your health care provider. °Document Released: 03/03/2006 Document Revised: 06/18/2015 Document Reviewed: 06/27/2012 °Elsevier Interactive Patient Education © 2017 Elsevier Inc. ° °

## 2016-06-30 NOTE — MAU Note (Signed)
Urine in lab 

## 2016-06-30 NOTE — MAU Provider Note (Signed)
History     CSN: [id number]  Arrival date and time: 06/30/16 1442   First Provider Initiated Contact with Patient 06/30/16 1658      Chief Complaint  Patient presents with  . Abdominal Pain   HPI   Debbie Mercer is a 21 yo G3P1011, 2263w3d determined by LMP who presents to the maternity admissions unit for abdominal pain and nausea.  She reports that the nausea began about 2 weeks ago and she has been unable to eat or drink because of it. Her abdominal pain began yesterday, but has progressively gotten worse over the past 24 hours.  She describes it as a cramp, ranks it as a 4-5/10, and is localized in the suprapubic and lower quadrant areas.  She went to the ER yesterday for what she thought was dehydration, and was treated with antibiotics for supposed PID, and had a positive pregnancy test. She has had one full term vaginal birth with no complications other than a placental hematoma in the first trimester that resolved before birth.  She recently had a miscarriage at 6wks in March. She admits to a mild headache and diarrhea, denies any vomiting, fever, shortness of breath, dysuria, vaginal bleeding or vaginal pain.  OB History    Gravida Para Term Preterm AB Living   3 1 1   1 1    SAB TAB Ectopic Multiple Live Births   1       1      Past Medical History:  Diagnosis Date  . Medical history non-contributory     Past Surgical History:  Procedure Laterality Date  . BREAST LUMPECTOMY      Family History  Problem Relation Age of Onset  . Hypertension Mother   . Diabetes Maternal Grandmother     Social History  Substance Use Topics  . Smoking status: Never Smoker  . Smokeless tobacco: Never Used  . Alcohol use No    Allergies:  Allergies  Allergen Reactions  . Shellfish Allergy Anaphylaxis and Itching    Prescriptions Prior to Admission  Medication Sig Dispense Refill Last Dose  . famotidine (PEPCID) 20 MG tablet Take 1 tablet (20 mg total) by mouth daily. 30 tablet  0   . ibuprofen (ADVIL,MOTRIN) 600 MG tablet Take 600 mg by mouth every 6 (six) hours as needed for pain.   Past Week at Unknown time  . naproxen sodium (ANAPROX DS) 550 MG tablet Take 1 tablet (550 mg total) by mouth 2 (two) times daily with a meal. (Patient not taking: Reported on 06/29/2016) 20 tablet 0 Not Taking at Unknown time  . ondansetron (ZOFRAN ODT) 4 MG disintegrating tablet Take 1 tablet (4 mg total) by mouth every 8 (eight) hours as needed for nausea or vomiting. 10 tablet 0   . Prenatal Vit-Fe Fumarate-FA (PRENATAL COMPLETE) 14-0.4 MG TABS Take 1 tablet by mouth daily. 60 each 0     Review of Systems  Constitutional: Positive for fatigue. Negative for fever.  Gastrointestinal: Positive for abdominal pain, diarrhea and nausea. Negative for vomiting.  Genitourinary: Positive for vaginal discharge. Negative for dysuria, vaginal bleeding and vaginal pain.   Physical Exam   Blood pressure (!) 89/49, pulse 72, temperature 99.1 F (37.3 C), temperature source Oral, resp. rate 17, last menstrual period 05/16/2016, unknown if currently breastfeeding.  Physical Exam  Constitutional: She is oriented to person, place, and time. She appears well-developed and well-nourished.  Cardiovascular: Normal rate, regular rhythm and normal heart sounds.   Respiratory: Effort normal  and breath sounds normal.  GI: Soft. Bowel sounds are normal. There is tenderness in the suprapubic area, left upper quadrant and left lower quadrant.  Neurological: She is alert and oriented to person, place, and time.  Skin: Skin is warm and dry.  Psychiatric: She has a normal mood and affect. Her behavior is normal.    MAU Course  Procedures Results for orders placed or performed during the hospital encounter of 06/30/16 (from the past 24 hour(s))  Urinalysis, Routine w reflex microscopic     Status: Abnormal   Collection Time: 06/30/16  3:40 PM  Result Value Ref Range   Color, Urine YELLOW YELLOW   APPearance  CLEAR CLEAR   Specific Gravity, Urine 1.025 1.005 - 1.030   pH 6.0 5.0 - 8.0   Glucose, UA NEGATIVE NEGATIVE mg/dL   Hgb urine dipstick NEGATIVE NEGATIVE   Bilirubin Urine NEGATIVE NEGATIVE   Ketones, ur 20 (A) NEGATIVE mg/dL   Protein, ur NEGATIVE NEGATIVE mg/dL   Nitrite NEGATIVE NEGATIVE   Leukocytes, UA NEGATIVE NEGATIVE  CBC     Status: Abnormal   Collection Time: 06/30/16  6:05 PM  Result Value Ref Range   WBC 9.5 4.0 - 10.5 K/uL   RBC 5.35 (H) 3.87 - 5.11 MIL/uL   Hemoglobin 12.7 12.0 - 15.0 g/dL   HCT 40.9 81.1 - 91.4 %   MCV 72.9 (L) 78.0 - 100.0 fL   MCH 23.7 (L) 26.0 - 34.0 pg   MCHC 32.6 30.0 - 36.0 g/dL   RDW 78.2 95.6 - 21.3 %   Platelets 312 150 - 400 K/uL  Basic metabolic panel     Status: Abnormal   Collection Time: 06/30/16  6:05 PM  Result Value Ref Range   Sodium 133 (L) 135 - 145 mmol/L   Potassium 3.6 3.5 - 5.1 mmol/L   Chloride 102 101 - 111 mmol/L   CO2 21 (L) 22 - 32 mmol/L   Glucose, Bld 77 65 - 99 mg/dL   BUN 6 6 - 20 mg/dL   Creatinine, Ser 0.86 0.44 - 1.00 mg/dL   Calcium 9.2 8.9 - 57.8 mg/dL   GFR calc non Af Amer >60 >60 mL/min   GFR calc Af Amer >60 >60 mL/min   Anion gap 10 5 - 15   US Ob Comp Less 14 Wks  Result Date: 06/30/2016 CLINICAL DATA:  Abdominal pain with positive pregnancy test. EXAM: OBSTETRIC <14 WK [place] AND TRANSVAGINAL OB US TECHNIQUE: Both transabdominal and transvaginal ultrasound examinations were performed for complete evaluation of the gestation as well as the maternal uterus, adnexal regions, and pelvic cul-de-sac. Transvaginal technique was performed to assess early pregnancy. COMPARISON:  None. FINDINGS: Intrauterine gestational sac: Single. Yolk sac:  Visualized. Embryo:  Visualized. Cardiac Activity: Visualized. Heart Rate: 180  bpm CRL:  3  mm   5 w   6 d                  [place] EDC: 02/24/2017. Subchorionic hemorrhage:  Tiny. Maternal uterus/adnexae: Corpus luteum cyst identified left ovary. No right adnexal mass. No  substantial intraperitoneal free fluid. IMPRESSION: Single living intrauterine gestation at estimated 5 week 6 day gestational age by crown-rump length. Electronically Signed   By: Kennith Center M.D.   On: 06/30/2016 19:36   US Ob Transvaginal  Result Date: 06/30/2016 CLINICAL DATA:  Abdominal pain with positive pregnancy test. EXAM: OBSTETRIC <14 WK [place] AND TRANSVAGINAL OB US TECHNIQUE: Both transabdominal and transvaginal ultrasound examinations  were performed for complete evaluation of the gestation as well as the maternal uterus, adnexal regions, and pelvic cul-de-sac. Transvaginal technique was performed to assess early pregnancy. COMPARISON:  None. FINDINGS: Intrauterine gestational sac: Single. Yolk sac:  Visualized. Embryo:  Visualized. Cardiac Activity: Visualized. Heart Rate: 180  bpm CRL:  3  mm   5 w   6 d                  [place] EDC: 02/24/2017. Subchorionic hemorrhage:  Tiny. Maternal uterus/adnexae: Corpus luteum cyst identified left ovary. No right adnexal mass. No substantial intraperitoneal free fluid. IMPRESSION: Single living intrauterine gestation at estimated 5 week 6 day gestational age by crown-rump length. Electronically Signed   By: Kennith Center M.D.   On: 06/30/2016 19:36    MDM UA US OB transvaginal US OB comp less 14 wks CBC CMP D5W LR 1L bolus IV promethazine    Assessment and Plan    Jeanann Lewandowsky PA-S 06/30/2016, 5:09 PM    I confirm that I have verified the information documented in the physician assistant student's note and that I have also personally performed the physical exam and all medical decision making activities. -Patient reports resolution of symptoms after IV fluids & phenergan -Ultrasound shows SIUP with cardiac activity   A:  1. Normal IUP (intrauterine pregnancy) on prenatal ultrasound, first trimester   2. Abdominal pain affecting pregnancy   3. Nausea and vomiting during pregnancy prior to [redacted] weeks gestation    P: Discharge home Rx  phenergan Call ob/gyn to start prenatal care Discussed reasons to return to MAU  Judeth Horn, NP

## 2016-07-12 ENCOUNTER — Inpatient Hospital Stay (HOSPITAL_COMMUNITY)
Admission: AD | Admit: 2016-07-12 | Discharge: 2016-07-12 | Disposition: A | Discharge status: Home / Self Care | Payer: BLUE CROSS/BLUE SHIELD | Source: Ambulatory Visit | Attending: Family Medicine | Admitting: Family Medicine

## 2016-07-12 DIAGNOSIS — O209 Hemorrhage in early pregnancy, unspecified: Secondary | ICD-10-CM | POA: Diagnosis not present

## 2016-07-12 DIAGNOSIS — Z91013 Allergy to seafood: Secondary | ICD-10-CM | POA: Insufficient documentation

## 2016-07-12 DIAGNOSIS — Z9889 Other specified postprocedural states: Secondary | ICD-10-CM | POA: Diagnosis not present

## 2016-07-12 DIAGNOSIS — Z3A08 8 weeks gestation of pregnancy: Secondary | ICD-10-CM | POA: Insufficient documentation

## 2016-07-12 DIAGNOSIS — O4691 Antepartum hemorrhage, unspecified, first trimester: Secondary | ICD-10-CM | POA: Insufficient documentation

## 2016-07-12 DIAGNOSIS — Z8249 Family history of ischemic heart disease and other diseases of the circulatory system: Secondary | ICD-10-CM | POA: Diagnosis not present

## 2016-07-12 DIAGNOSIS — Z833 Family history of diabetes mellitus: Secondary | ICD-10-CM | POA: Insufficient documentation

## 2016-07-12 LAB — WET PREP, GENITAL
CLUE CELLS WET PREP: NONE SEEN
Sperm: NONE SEEN
Trich, Wet Prep: NONE SEEN
WBC WET PREP: NONE SEEN
Yeast Wet Prep HPF POC: NONE SEEN

## 2016-07-12 LAB — URINALYSIS, DIPSTICK ONLY

## 2016-07-12 LAB — OB RESULTS CONSOLE GC/CHLAMYDIA: GC PROBE AMP, GENITAL: NEGATIVE

## 2016-07-12 NOTE — MAU Note (Signed)
Pt . Started bleeding post intercourse.  States " I just don't feel pregnant anymore" When asked why "I'm not nauseas, and not cramping" States she would cramp for "10 mins each day - level 2 or 3" (in ref to pain)

## 2016-07-12 NOTE — MAU Note (Signed)
+  vaginal bleeding Red in color Started this am following Intercourse  Denies any pain

## 2016-07-12 NOTE — Discharge Instructions (Signed)
Subchorionic Hematoma °A subchorionic hematoma is a gathering of blood between the outer wall of the placenta and the inner wall of the womb (uterus). The placenta is the organ that connects the fetus to the wall of the uterus. The placenta performs the feeding, breathing (oxygen to the fetus), and waste removal (excretory work) of the fetus. °Subchorionic hematoma is the most common abnormality found on a result from ultrasonography done during the first trimester or early second trimester of pregnancy. If there has been little or no vaginal bleeding, early small hematomas usually shrink on their own and do not affect your baby or pregnancy. The blood is gradually absorbed over 1-2 weeks. When bleeding starts later in pregnancy or the hematoma is larger or occurs in an older pregnant woman, the outcome may not be as good. Larger hematomas may get bigger, which increases the chances for miscarriage. Subchorionic hematoma also increases the risk of premature detachment of the placenta from the uterus, preterm (premature) labor, and stillbirth. °Follow these instructions at home: °· Stay on bed rest if your health care provider recommends this. Although bed rest will not prevent more bleeding or prevent a miscarriage, your health care provider may recommend bed rest until you are advised otherwise. °· Avoid heavy lifting (more than 10 lb [4.5 kg]), exercise, sexual intercourse, or douching as directed by your health care provider. °· Keep track of the number of pads you use each day and how soaked (saturated) they are. Write down this information. °· Do not use tampons. °· Keep all follow-up appointments as directed by your health care provider. Your health care provider may ask you to have follow-up blood tests or ultrasound tests or both. °Get help right away if: °· You have severe cramps in your stomach, back, abdomen, or pelvis. °· You have a fever. °· You pass large clots or tissue. Save any tissue for your  health care provider to look at. °· Your bleeding increases or you become lightheaded, feel weak, or have fainting episodes. °This information is not intended to replace advice given to you by your health care provider. Make sure you discuss any questions you have with your health care provider. °Document Released: 04/27/2006 Document Revised: 06/18/2015 Document Reviewed: 08/09/2012 °Elsevier Interactive Patient Education © 2017 [employer]. ° °

## 2016-07-12 NOTE — MAU Provider Note (Signed)
History     CSN: [id number]  Arrival date and time: 07/12/16 [id number]   First Provider Initiated Contact with Patient 07/12/16 [phone]      Chief Complaint  Patient presents with  . Vaginal Bleeding   HPI Debbie Mercer is a 21 y.o. G3P1011 at 5652w1d who presents complaining of bright red vaginal bleeding that started this morning after she had intercourse. She states it is more than spotting but less than a period and had to wear a pad. She denies any pain or abnormal discharge. She was seen in MAU on 6/7 and had an ultrasound that showed an IUP with a heartbeat and a tiny subchorionic hemorrhage.  OB History    Gravida Para Term Preterm AB Living   3 1 1   1 1    SAB TAB Ectopic Multiple Live Births   1       1      Past Medical History:  Diagnosis Date  . Medical history non-contributory     Past Surgical History:  Procedure Laterality Date  . BREAST LUMPECTOMY      Family History  Problem Relation Age of Onset  . Hypertension Mother   . Diabetes Maternal Grandmother     Social History  Substance Use Topics  . Smoking status: Never Smoker  . Smokeless tobacco: Never Used  . Alcohol use No    Allergies:  Allergies  Allergen Reactions  . Shellfish Allergy Anaphylaxis and Itching    Prescriptions Prior to Admission  Medication Sig Dispense Refill Last Dose  . Prenatal Vit-Fe Fumarate-FA (PRENATAL COMPLETE) 14-0.4 MG TABS Take 1 tablet by mouth daily. 60 each 0 07/11/2016 at Unknown time  . famotidine (PEPCID) 20 MG tablet Take 1 tablet (20 mg total) by mouth daily. 30 tablet 0 Unknown at Unknown time  . promethazine (PHENERGAN) 25 MG tablet Take 1 tablet (25 mg total) by mouth every 6 (six) hours as needed for nausea or vomiting. (Patient not taking: Reported on 07/12/2016) 30 tablet 0 Not Taking at Unknown time    Review of Systems  Constitutional: Negative.  Negative for fatigue and fever.  Respiratory: Negative.  Negative for shortness of breath.    Cardiovascular: Negative.  Negative for chest pain.  Gastrointestinal: Negative.  Negative for abdominal pain, diarrhea, nausea and vomiting.  Genitourinary: Positive for vaginal bleeding. Negative for vaginal discharge.  Neurological: Negative.  Negative for headaches.   Physical Exam   Blood pressure 99/61, pulse 98, temperature 99.2 F (37.3 C), temperature source Oral, resp. rate 17, height 5\' 3"  (1.6 m), weight 169 lb (76.7 kg), last menstrual period 05/16/2016, SpO2 100 %, unknown if currently breastfeeding.  Physical Exam  Nursing note and vitals reviewed. Constitutional: She is oriented to person, place, and time. She appears well-developed and well-nourished.  HENT:  Head: Normocephalic and atraumatic.  Eyes: Conjunctivae are normal. No scleral icterus.  Cardiovascular: Normal rate, regular rhythm and normal heart sounds.   Respiratory: Effort normal. No respiratory distress.  GI: Soft. There is no tenderness.  Genitourinary: Cervix exhibits no motion tenderness and no friability. There is bleeding in the vagina.  Genitourinary Comments: Small amount of dark red blood noted in vagina  Neurological: She is alert and oriented to person, place, and time.  Skin: Skin is warm and dry.  Psychiatric: She has a normal mood and affect. Her behavior is normal. Judgment and thought content normal.   Pelvic exam: Cervix pink, visually closed, without lesion, vaginal walls and external genitalia  normal Bimanual exam: Cervix 0/long/high, firm, anterior, neg CMT, uterus nontender, adnexa without tenderness, enlargement, or mass  MAU Course  Procedures Results for orders placed or performed during the hospital encounter of 07/12/16 (from the past 24 hour(s))  Urinalysis, dipstick only     Status: Abnormal   Collection Time: 07/12/16  8:36 AM  Result Value Ref Range   Color, Urine RED (A) YELLOW   APPearance TURBID (A) CLEAR   Specific Gravity, Urine  1.005 - 1.030    TEST NOT REPORTED  DUE TO COLOR INTERFERENCE OF URINE PIGMENT   pH  5.0 - 8.0    TEST NOT REPORTED DUE TO COLOR INTERFERENCE OF URINE PIGMENT   Glucose, UA (A) NEGATIVE mg/dL    TEST NOT REPORTED DUE TO COLOR INTERFERENCE OF URINE PIGMENT   Hgb urine dipstick (A) NEGATIVE    TEST NOT REPORTED DUE TO COLOR INTERFERENCE OF URINE PIGMENT   Bilirubin Urine (A) NEGATIVE    TEST NOT REPORTED DUE TO COLOR INTERFERENCE OF URINE PIGMENT   Ketones, ur (A) NEGATIVE mg/dL    TEST NOT REPORTED DUE TO COLOR INTERFERENCE OF URINE PIGMENT   Protein, ur (A) NEGATIVE mg/dL    TEST NOT REPORTED DUE TO COLOR INTERFERENCE OF URINE PIGMENT   Nitrite (A) NEGATIVE    TEST NOT REPORTED DUE TO COLOR INTERFERENCE OF URINE PIGMENT   Leukocytes, UA (A) NEGATIVE    TEST NOT REPORTED DUE TO COLOR INTERFERENCE OF URINE PIGMENT  Wet prep, genital     Status: None   Collection Time: 07/12/16  9:25 AM  Result Value Ref Range   Yeast Wet Prep HPF POC NONE SEEN NONE SEEN   Trich, Wet Prep NONE SEEN NONE SEEN   Clue Cells Wet Prep HPF POC NONE SEEN NONE SEEN   WBC, Wet Prep HPF POC NONE SEEN NONE SEEN   Sperm NONE SEEN    MDM UA Pelvic with wet prep and gc/chlamydia Beside u/s by Ivonne Andrew CNM shows showed IUP with HR 150s Assessment and Plan   1. Vaginal bleeding in pregnancy, first trimester    -Discharge patient home is stable condition.  -Discussed with patient subchorionic hemorrhage from last ultrasound. -Reviewed bleeding precautions with patient and encouraged pelvic rest x1 week. -Follow up with [facility] as scheduled to start prenatal care. -Encouraged to return here or to other Urgent Care/ED if she develops worsening of symptoms, increase in pain, fever, or other concerning symptoms.   Follow-up Information    [facility] Follow up.   Why:  Start prenatal care Contact information: [address] [place] [address] [phone]       [facility]  [place] MATERNITY ADMISSIONS Follow up.   Why:  in pregnancy emergencies Contact information: [address] [id number] mc [place] [address] [phone]         Cleone Slim SNM 07/12/2016, 9:59 AM   I was present for the exam and agree with above. Pos cardiac activity per BS [place]. Cervix closed and long.  1. Vaginal bleeding in pregnancy, first trimester   Bleeding precautions. Pelvic rest  Katrinka Blazing [place] 07/14/2016 8:55 PM

## 2016-07-13 LAB — GC/CHLAMYDIA PROBE AMP (~~LOC~~) NOT AT ARMC
CHLAMYDIA, DNA PROBE: NEGATIVE
Neisseria Gonorrhea: NEGATIVE

## 2016-07-28 ENCOUNTER — Encounter: Payer: Self-pay | Admitting: Advanced Practice Midwife

## 2016-07-28 ENCOUNTER — Ambulatory Visit (INDEPENDENT_AMBULATORY_CARE_PROVIDER_SITE_OTHER): Payer: BLUE CROSS/BLUE SHIELD | Admitting: Advanced Practice Midwife

## 2016-07-28 ENCOUNTER — Other Ambulatory Visit (HOSPITAL_COMMUNITY)
Admission: RE | Admit: 2016-07-28 | Discharge: 2016-07-28 | Disposition: A | Discharge status: Home / Self Care | Payer: BLUE CROSS/BLUE SHIELD | Source: Ambulatory Visit | Attending: Obstetrics | Admitting: Obstetrics

## 2016-07-28 VITALS — BP 114/75 | HR 93 | Wt 173.8 lb

## 2016-07-28 DIAGNOSIS — Z3481 Encounter for supervision of other normal pregnancy, first trimester: Secondary | ICD-10-CM

## 2016-07-28 DIAGNOSIS — Z01419 Encounter for gynecological examination (general) (routine) without abnormal findings: Secondary | ICD-10-CM | POA: Insufficient documentation

## 2016-07-28 DIAGNOSIS — Z87440 Personal history of urinary (tract) infections: Secondary | ICD-10-CM | POA: Insufficient documentation

## 2016-07-28 DIAGNOSIS — Z3480 Encounter for supervision of other normal pregnancy, unspecified trimester: Secondary | ICD-10-CM | POA: Insufficient documentation

## 2016-07-28 DIAGNOSIS — Z87448 Personal history of other diseases of urinary system: Secondary | ICD-10-CM

## 2016-07-28 NOTE — Patient Instructions (Signed)
First Trimester of Pregnancy The first trimester of pregnancy is from week 1 until the end of week 13 (months 1 through 3). A week after a sperm fertilizes an egg, the egg will implant on the wall of the uterus. This embryo will begin to develop into a baby. Genes from you and your partner will form the baby. The female genes will determine whether the baby will be a boy or a girl. At 6-8 weeks, the eyes and face will be formed, and the heartbeat can be seen on ultrasound. At the end of 12 weeks, all the baby's organs will be formed. Now that you are pregnant, you will want to do everything you can to have a healthy baby. Two of the most important things are to get good prenatal care and to follow your health care provider's instructions. Prenatal care is all the medical care you receive before the baby's birth. This care will help prevent, find, and treat any problems during the pregnancy and childbirth. Body changes during your first trimester Your body goes through many changes during pregnancy. The changes vary from woman to woman.  You may gain or lose a couple of pounds at first.  You may feel sick to your stomach (nauseous) and you may throw up (vomit). If the vomiting is uncontrollable, call your health care provider.  You may tire easily.  You may develop headaches that can be relieved by medicines. All medicines should be approved by your health care provider.  You may urinate more often. Painful urination may mean you have a bladder infection.  You may develop heartburn as a result of your pregnancy.  You may develop constipation because certain hormones are causing the muscles that push stool through your intestines to slow down.  You may develop hemorrhoids or swollen veins (varicose veins).  Your breasts may begin to grow larger and become tender. Your nipples may stick out more, and the tissue that surrounds them (areola) may become darker.  Your gums may bleed and may be  sensitive to brushing and flossing.  Dark spots or blotches (chloasma, mask of pregnancy) may develop on your face. This will likely fade after the baby is born.  Your menstrual periods will stop.  You may have a loss of appetite.  You may develop cravings for certain kinds of food.  You may have changes in your emotions from day to day, such as being excited to be pregnant or being concerned that something may go wrong with the pregnancy and baby.  You may have more vivid and strange dreams.  You may have changes in your hair. These can include thickening of your hair, rapid growth, and changes in texture. Some women also have hair loss during or after pregnancy, or hair that feels dry or thin. Your hair will most likely return to normal after your baby is born.  What to expect at prenatal visits During a routine prenatal visit:  You will be weighed to make sure you and the baby are growing normally.  Your blood pressure will be taken.  Your abdomen will be measured to track your baby's growth.  The fetal heartbeat will be listened to between weeks 10 and 14 of your pregnancy.  Test results from any previous visits will be discussed.  Your health care provider may ask you:  How you are feeling.  If you are feeling the baby move.  If you have had any abnormal symptoms, such as leaking fluid, bleeding, severe headaches,   or abdominal cramping.  If you are using any tobacco products, including cigarettes, chewing tobacco, and electronic cigarettes.  If you have any questions.  Other tests that may be performed during your first trimester include:  Blood tests to find your blood type and to check for the presence of any previous infections. The tests will also be used to check for low iron levels (anemia) and protein on red blood cells (Rh antibodies). Depending on your risk factors, or if you previously had diabetes during pregnancy, you may have tests to check for high blood  sugar that affects pregnant women (gestational diabetes).  Urine tests to check for infections, diabetes, or protein in the urine.  An ultrasound to confirm the proper growth and development of the baby.  Fetal screens for spinal cord problems (spina bifida) and Down syndrome.  HIV (human immunodeficiency virus) testing. Routine prenatal testing includes screening for HIV, unless you choose not to have this test.  You may need other tests to make sure you and the baby are doing well.  Follow these instructions at home: Medicines  Follow your health care provider's instructions regarding medicine use. Specific medicines may be either safe or unsafe to take during pregnancy.  Take a prenatal vitamin that contains at least 600 micrograms (mcg) of folic acid.  If you develop constipation, try taking a stool softener if your health care provider approves. Eating and drinking  Eat a balanced diet that includes fresh fruits and vegetables, whole grains, good sources of protein such as meat, eggs, or tofu, and low-fat dairy. Your health care provider will help you determine the amount of weight gain that is right for you.  Avoid raw meat and uncooked cheese. These carry germs that can cause birth defects in the baby.  Eating four or five small meals rather than three large meals a day may help relieve nausea and vomiting. If you start to feel nauseous, eating a few soda crackers can be helpful. Drinking liquids between meals, instead of during meals, also seems to help ease nausea and vomiting.  Limit foods that are high in fat and processed sugars, such as fried and sweet foods.  To prevent constipation: ? Eat foods that are high in fiber, such as fresh fruits and vegetables, whole grains, and beans. ? Drink enough fluid to keep your urine clear or pale yellow. Activity  Exercise only as directed by your health care provider. Most women can continue their usual exercise routine during  pregnancy. Try to exercise for 30 minutes at least 5 days a week. Exercising will help you: ? Control your weight. ? Stay in shape. ? Be prepared for labor and delivery.  Experiencing pain or cramping in the lower abdomen or lower back is a good sign that you should stop exercising. Check with your health care provider before continuing with normal exercises.  Try to avoid standing for long periods of time. Move your legs often if you must stand in one place for a long time.  Avoid heavy lifting.  Wear low-heeled shoes and practice good posture.  You may continue to have sex unless your health care provider tells you not to. Relieving pain and discomfort  Wear a good support bra to relieve breast tenderness.  Take warm sitz baths to soothe any pain or discomfort caused by hemorrhoids. Use hemorrhoid cream if your health care provider approves.  Rest with your legs elevated if you have leg cramps or low back pain.  If you develop   varicose veins in your legs, wear support hose. Elevate your feet for 15 minutes, 3-4 times a day. Limit salt in your diet. Prenatal care  Schedule your prenatal visits by the twelfth week of pregnancy. They are usually scheduled monthly at first, then more often in the last 2 months before delivery.  Write down your questions. Take them to your prenatal visits.  Keep all your prenatal visits as told by your health care provider. This is important. Safety  Wear your seat belt at all times when driving.  Make a list of emergency phone numbers, including numbers for family, friends, the hospital, and police and fire departments. General instructions  Ask your health care provider for a referral to a local prenatal education class. Begin classes no later than the beginning of month 6 of your pregnancy.  Ask for help if you have counseling or nutritional needs during pregnancy. Your health care provider can offer advice or refer you to specialists for help  with various needs.  Do not use hot tubs, steam rooms, or saunas.  Do not douche or use tampons or scented sanitary pads.  Do not cross your legs for long periods of time.  Avoid cat litter boxes and soil used by cats. These carry germs that can cause birth defects in the baby and possibly loss of the fetus by miscarriage or stillbirth.  Avoid all smoking, herbs, alcohol, and medicines not prescribed by your health care provider. Chemicals in these products affect the formation and growth of the baby.  Do not use any products that contain nicotine or tobacco, such as cigarettes and e-cigarettes. If you need help quitting, ask your health care provider. You may receive counseling support and other resources to help you quit.  Schedule a dentist appointment. At home, brush your teeth with a soft toothbrush and be gentle when you floss. Contact a health care provider if:  You have dizziness.  You have mild pelvic cramps, pelvic pressure, or nagging pain in the abdominal area.  You have persistent nausea, vomiting, or diarrhea.  You have a bad smelling vaginal discharge.  You have pain when you urinate.  You notice increased swelling in your face, hands, legs, or ankles.  You are exposed to fifth disease or chickenpox.  You are exposed to German measles (rubella) and have never had it. Get help right away if:  You have a fever.  You are leaking fluid from your vagina.  You have spotting or bleeding from your vagina.  You have severe abdominal cramping or pain.  You have rapid weight gain or loss.  You vomit blood or material that looks like coffee grounds.  You develop a severe headache.  You have shortness of breath.  You have any kind of trauma, such as from a fall or a car accident. Summary  The first trimester of pregnancy is from week 1 until the end of week 13 (months 1 through 3).  Your body goes through many changes during pregnancy. The changes vary from  woman to woman.  You will have routine prenatal visits. During those visits, your health care provider will examine you, discuss any test results you may have, and talk with you about how you are feeling. This information is not intended to replace advice given to you by your health care provider. Make sure you discuss any questions you have with your health care provider. Document Released: 01/04/2001 Document Revised: 12/23/2015 Document Reviewed: 12/23/2015 Elsevier Interactive Patient Education  2017 Elsevier   Inc.  

## 2016-07-28 NOTE — Progress Notes (Signed)
Patient reports some bloating at times.

## 2016-07-28 NOTE — Progress Notes (Signed)
  Subjective:    Debbie Mercer is a G3P1011 3781w3d being seen today for her first obstetrical visit.  Her obstetrical history is significant for nothing.  Had normal vaginal delivery of 8+1lb baby, with epidural but only 1 hour second stage. Patient does intend to breast feed. Pregnancy history fully reviewed.  Patient reports no complaints.  Vitals:   07/28/16 1017  BP: 114/75  Pulse: 93  Weight: 173 lb 12.8 oz (78.8 kg)    HISTORY: OB History  Gravida Para Term Preterm AB Living  3 1 1   1 1   SAB TAB Ectopic Multiple Live Births  1       1    # Outcome Date GA Lbr Len/2nd Weight Sex Delivery Anes PTL Lv  3 Current           2 Term 03/14/13 5280w6d 13:20 / 01:13 8 lb 1.3 oz (3.665 kg) M Vag-Spont EPI  LIV  1 SAB              Past Medical History:  Diagnosis Date  . Medical history non-contributory    Past Surgical History:  Procedure Laterality Date  . BREAST LUMPECTOMY     Family History  Problem Relation Age of Onset  . Hypertension Mother   . Diabetes Maternal Grandmother   . Anemia Maternal Grandmother      Exam    Uterus:  Fundal Height: 10 cm  Pelvic Exam:    Perineum: Normal Perineum   Vulva: Bartholin's, Urethra, Skene's normal   Vagina:  normal discharge   pH:    Cervix: no bleeding following Pap and no cervical motion tenderness   Adnexa: no mass, fullness, tenderness   Bony Pelvis: gynecoid  Proven to 8+1  System: Breast:  normal appearance, no masses or tenderness   Skin: normal coloration and turgor, no rashes    Neurologic: oriented, grossly non-focal   Extremities: normal strength, tone, and muscle mass   HEENT neck supple with midline trachea   Mouth/Teeth mucous membranes moist, pharynx normal without lesions   Neck supple   Cardiovascular: regular rate and rhythm   Respiratory:  appears well, vitals normal, no respiratory distress, acyanotic, normal RR, ear and throat exam is normal, neck free of mass or lymphadenopathy, chest clear, no  wheezing, crepitations, rhonchi, normal symmetric air entry   Abdomen: soft, non-tender; bowel sounds normal; no masses,  no organomegaly   Urinary: urethral meatus normal      Assessment:    Pregnancy: [id number] Patient Active Problem List   Diagnosis Date Noted  . Supervision of other normal pregnancy, antepartum 07/28/2016  . History of acute pyelonephritis 07/28/2016  . 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 GERD (gastroesophageal reflux disease) 03/05/2013        Plan:     Initial labs drawn. Prenatal vitamins. Problem list reviewed and updated. Genetic Screening discussed First Screen: ordered.  Ultrasound discussed; fetal survey: ordered.  Follow up in 4 weeks. 50% of 30 min visit spent on counseling and coordination of care.   Routines reviewed Welcomed to practice Does not want epidural this time.  Was 8cm at arrival first baby and only pushed 1 hour. Wants first screen Declines Babyscripts   Wynelle BourgeoisMarie Malini Flemings 07/28/2016

## 2016-07-29 LAB — CYTOLOGY - PAP: DIAGNOSIS: NEGATIVE

## 2016-07-30 LAB — URINE CULTURE, OB REFLEX

## 2016-07-30 LAB — CULTURE, OB URINE

## 2016-08-01 LAB — PRENATAL PROFILE I(LABCORP)
ANTIBODY SCREEN: NEGATIVE
BASOS: 0 %
Basophils Absolute: 0 10*3/uL (ref 0.0–0.2)
EOS (ABSOLUTE): 0.1 10*3/uL (ref 0.0–0.4)
EOS: 2 %
HEMATOCRIT: 39.8 % (ref 34.0–46.6)
Hemoglobin: 12.4 g/dL (ref 11.1–15.9)
Hepatitis B Surface Ag: NEGATIVE
IMMATURE GRANS (ABS): 0 10*3/uL (ref 0.0–0.1)
Immature Granulocytes: 0 %
LYMPHS: 24 %
Lymphocytes Absolute: 1.7 10*3/uL (ref 0.7–3.1)
MCH: 23.1 pg — ABNORMAL LOW (ref 26.6–33.0)
MCHC: 31.2 g/dL — ABNORMAL LOW (ref 31.5–35.7)
MCV: 74 fL — AB (ref 79–97)
MONOCYTES: 11 %
Monocytes Absolute: 0.8 10*3/uL (ref 0.1–0.9)
NEUTROS ABS: 4.5 10*3/uL (ref 1.4–7.0)
Neutrophils: 63 %
Platelets: 340 10*3/uL (ref 150–379)
RBC: 5.37 x10E6/uL — ABNORMAL HIGH (ref 3.77–5.28)
RDW: 13.9 % (ref 12.3–15.4)
RH TYPE: POSITIVE
RPR: NONREACTIVE
RUBELLA: 6.34 {index} (ref >0.99)
WBC: 7 10*3/uL (ref 3.4–10.8)

## 2016-08-01 LAB — HEMOGLOBINOPATHY EVALUATION
HEMOGLOBIN F QUANTITATION: 0 % (ref 0.0–2.0)
HGB C: 0 %
HGB S: 0 %
HGB VARIANT: 0 %
Hemoglobin A2 Quantitation: 2.2 % (ref 1.8–3.2)
Hgb A: 97.8 % (ref 96.4–98.8)

## 2016-08-01 LAB — VARICELLA ZOSTER ANTIBODY, IGG

## 2016-08-01 LAB — VITAMIN D 25 HYDROXY (VIT D DEFICIENCY, FRACTURES): Vit D, 25-Hydroxy: 13.5 ng/mL — ABNORMAL LOW (ref 30.0–100.0)

## 2016-08-02 ENCOUNTER — Other Ambulatory Visit: Payer: Self-pay | Admitting: Certified Nurse Midwife

## 2016-08-02 DIAGNOSIS — E559 Vitamin D deficiency, unspecified: Secondary | ICD-10-CM | POA: Insufficient documentation

## 2016-08-02 MED ORDER — VITAMIN D (ERGOCALCIFEROL) 1.25 MG (50000 UNIT) PO CAPS: 50000.0000 [IU] | ORAL_CAPSULE | ORAL | 2 refills | Status: DC

## 2016-08-12 ENCOUNTER — Ambulatory Visit (HOSPITAL_COMMUNITY)
Admission: RE | Admit: 2016-08-12 | Discharge: 2016-08-12 | Disposition: A | Discharge status: Home / Self Care | Payer: BLUE CROSS/BLUE SHIELD | Source: Ambulatory Visit | Attending: Advanced Practice Midwife | Admitting: Advanced Practice Midwife

## 2016-08-12 ENCOUNTER — Other Ambulatory Visit: Payer: Self-pay | Admitting: Advanced Practice Midwife

## 2016-08-12 ENCOUNTER — Encounter (HOSPITAL_COMMUNITY): Payer: Self-pay

## 2016-08-12 DIAGNOSIS — Z3A12 12 weeks gestation of pregnancy: Secondary | ICD-10-CM | POA: Diagnosis not present

## 2016-08-12 DIAGNOSIS — Z3682 Encounter for antenatal screening for nuchal translucency: Secondary | ICD-10-CM | POA: Diagnosis present

## 2016-08-12 DIAGNOSIS — Z3480 Encounter for supervision of other normal pregnancy, unspecified trimester: Secondary | ICD-10-CM

## 2016-08-15 ENCOUNTER — Other Ambulatory Visit: Payer: Self-pay

## 2016-08-19 ENCOUNTER — Emergency Department (HOSPITAL_COMMUNITY)
Admission: EM | Admit: 2016-08-19 | Discharge: 2016-08-20 | Disposition: A | Discharge status: Home / Self Care | Payer: BLUE CROSS/BLUE SHIELD | Attending: Emergency Medicine | Admitting: Emergency Medicine

## 2016-08-19 ENCOUNTER — Encounter (HOSPITAL_COMMUNITY): Payer: Self-pay | Admitting: Emergency Medicine

## 2016-08-19 DIAGNOSIS — F1290 Cannabis use, unspecified, uncomplicated: Secondary | ICD-10-CM | POA: Diagnosis not present

## 2016-08-19 DIAGNOSIS — R45851 Suicidal ideations: Secondary | ICD-10-CM | POA: Diagnosis not present

## 2016-08-19 DIAGNOSIS — F4325 Adjustment disorder with mixed disturbance of emotions and conduct: Secondary | ICD-10-CM

## 2016-08-19 DIAGNOSIS — F329 Major depressive disorder, single episode, unspecified: Secondary | ICD-10-CM | POA: Diagnosis present

## 2016-08-19 LAB — URINALYSIS, ROUTINE W REFLEX MICROSCOPIC
Bilirubin Urine: NEGATIVE
GLUCOSE, UA: NEGATIVE mg/dL
Hgb urine dipstick: NEGATIVE
Ketones, ur: NEGATIVE mg/dL
NITRITE: NEGATIVE
PH: 7 (ref 5.0–8.0)
Protein, ur: NEGATIVE mg/dL
Specific Gravity, Urine: 1.028 (ref 1.005–1.030)

## 2016-08-19 LAB — I-STAT BETA HCG BLOOD, ED (MC, WL, AP ONLY): I-stat hCG, quantitative: >2000 m[IU]/mL — ABNORMAL HIGH (ref <5)

## 2016-08-19 NOTE — BH Assessment (Signed)
[facility] Assessment Progress Note    Case was staffed with Cresenciano GenreLord DNP and Pecola Leisureeese MD who recommended patient be re-evaluated in the a.m.

## 2016-08-19 NOTE — ED Notes (Signed)
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

## 2016-08-19 NOTE — ED Notes (Signed)
Patient initially would not change into paper scrubs.  After sitting in her room for a while, nurse talked to patient for a while, then patient grabbed up her scrubs went into the restroom and changed into her scrubs.  Patient visible upset but complying with unit rules at this time.

## 2016-08-19 NOTE — ED Notes (Signed)
Hourly rounding reveals patient sleeping in room. No complaints, stable, in no acute distress. Q15 minute rounds and monitoring via Security Cameras to continue. 

## 2016-08-19 NOTE — ED Notes (Signed)
Pt arrived to unit very tearful and withdrawn.  I attempted to reassure patient.  She denies S/I and H/I.  She is not interested in participating in assessment.    Pt continues to say "I just want to go home."  15 minute checks and video monitoring in place.

## 2016-08-19 NOTE — BH Assessment (Addendum)
Assessment Note  Debbie Mercer is an 21 y.o. female that presents this date with thoughts of self harm and assault on others although denies during assessment. Patient presents being very liable and is tearful as this Clinical research associate attempts to conduct assessment. Patient is currently under IVC that was initiated in the ED after patient per IVC, stated S/I and thoughts of assaulting others. Patient refuses to answer most questions during the assessment stating, "I said to much already." Patient is refusing to change into scrubs and is demanding to leave the ED. This Clinical research associate informed patient of the IVC and attempted to explain the protocol associated with the IVC. Patient becomes very agitated and renders limited history. Patient states she is [redacted] weeks pregnant and resides with her 23 year old. Patient stated her child was currently in day care and she didn't have anyone to pick that child up but later retracted that statement informing staff that the child was with a family member. Patient stated she lost her employment three weeks ago and has so much stress that she is "going to go off." Patient will not elaborate on content. Patient denies any previous attempts/gestures at self harm or OP treatment for any MH issues. Patient denies any SA use or ever being diagnosed with any MH condition. Patient is time/place oriented and denies any S/I, H/I or AVH. Patient does state she "always feels like hurting others" because she gets so angry and doesn't know how to control her anger. Patient denies any current legal or past/present assaulting behaviors. Some information on assessment patient refused to answer, patient informs this writer she no longer wishes to participate in the assessment and is asking to leave. This Clinical research associate staffed case with Pecola Leisure MD who recommended patient be re-evaluated in the a.m. Per notes, patient from home who says she has periods of intense rage in which she feels like hurting others. Patient has a  three year old and is currently [redacted] weeks pregnant. Patient also today had feelings like she wanted to hurt herself but no specific plan. She reports this has been going on for as long as she can remember but she has never received any help. Patient denies AVH. Case was staffed with Cresenciano Genre and Pecola Leisure MD who recommended patient be re-evaluated in the a.m.  Diagnosis: Intermittent explosive D/O, GAD   Past Medical History:  Past Medical History:  Diagnosis Date  . Medical history non-contributory     Past Surgical History:  Procedure Laterality Date  . BREAST LUMPECTOMY      Family History:  Family History  Problem Relation Age of Onset  . Hypertension Mother   . Diabetes Maternal Grandmother   . Anemia Maternal Grandmother     Social History:  reports that she has never smoked. She has never used smokeless tobacco. She reports that she uses drugs, including Marijuana. She reports that she does not drink alcohol.  Additional Social History:  Alcohol / Drug Use Pain Medications: See MAR Prescriptions: See MAR Over the Counter: See MAR History of alcohol / drug use?: No history of alcohol / drug abuse (Patient denies) Longest period of sobriety (when/how long): NA Negative Consequences of Use:  (Denies) Withdrawal Symptoms:  (Denies)  CIWA: CIWA-Ar BP: 126/76 Pulse Rate: 96 COWS:    Allergies:  Allergies  Allergen Reactions  . Shellfish Allergy Anaphylaxis and Itching    Home Medications:  (Not in a hospital admission)  OB/GYN Status:  Patient's last menstrual period was 05/16/2016.  General  Assessment Data Location of Assessment: [facility] TTS Assessment: In system Is this a Tele or Face-to-Face Assessment?: Face-to-Face Is this an Initial Assessment or a Re-assessment for this encounter?: Initial Assessment Marital status: Single Maiden name: NA Is patient pregnant?: Yes Pregnancy Status: Yes (Comment: include estimated delivery date) ([redacted] weeks pregnant ) Living  Arrangements: Children (Pt resides with 23 year old son) Can pt return to current living arrangement?: Yes Admission Status: Involuntary Is patient capable of signing voluntary admission?: Yes Referral Source: Self/Family/Friend Insurance type: Medicaid  Medical Screening Exam [facility] Walk-in ONLY) Medical Exam completed: Yes  Crisis Care Plan Living Arrangements: Children (Pt resides with 23 year old son) Legal Guardian:  (NA) Name of Psychiatrist: None Name of Therapist: None  Education Status Is patient currently in school?: No Current Grade:  (NA) Highest grade of school patient has completed:  (12) Name of school:  (NA) Contact person:  (NA)  Risk to self with the past 6 months Suicidal Ideation: Yes-Currently Present (Per IVC) Has patient been a risk to self within the past 6 months prior to admission? : No Suicidal Intent: Yes-Currently Present Has patient had any suicidal intent within the past 6 months prior to admission? : No Is patient at risk for suicide?: Yes Suicidal Plan?: No Has patient had any suicidal plan within the past 6 months prior to admission? : No Access to Means: No What has been your use of drugs/alcohol within the last 12 months?: Denies current use Previous Attempts/Gestures: No How many times?: 0 Other Self Harm Risks: NA Triggers for Past Attempts: Unknown Intentional Self Injurious Behavior: None Family Suicide History: No Recent stressful life event(s): Other (Comment) (Family issues) Persecutory voices/beliefs?: No Depression: No Depression Symptoms:  (Pt denies) Substance abuse history and/or treatment for substance abuse?: No Suicide prevention information given to non-admitted patients: Not applicable  Risk to Others within the past 6 months Homicidal Ideation:  (UTA) Does patient have any lifetime risk of violence toward others beyond the six months prior to admission? :  (UTA) Thoughts of Harm to Others:  (UTA) Current Homicidal  Intent:  (UTA) Current Homicidal Plan:  (UTA) Access to Homicidal Means:  (UTA) Identified Victim:  (UTA) History of harm to others?:  (UTA) Assessment of Violence:  (UTA) Violent Behavior Description:  (UTA) Does patient have access to weapons?:  (UTA) Criminal Charges Pending?:  (UTA) Does patient have a court date:  (UTA) Is patient on probation?:  (UTA)  Psychosis Hallucinations:  (UTA) Delusions:  (UTA)  Mental Status Report Appearance/Hygiene: Unremarkable Eye Contact: Fair Motor Activity: Agitation Speech: Argumentative Level of Consciousness: Irritable Mood: Angry Affect: Angry, Anxious, Irritable Anxiety Level: Moderate Thought Processes: Relevant Judgement: Unimpaired Orientation: Person, Place, Time Obsessive Compulsive Thoughts/Behaviors: Unable to Assess  Cognitive Functioning Concentration: Unable to Assess Memory: Recent Intact IQ: Average Insight: Poor Impulse Control: Poor Appetite:  (UTA) Weight Loss:  (UTA) Weight Gain:  (UTA) Sleep:  (UTA) Total Hours of Sleep:  (UTA) Vegetative Symptoms: Unable to Assess  ADLScreening [facility] Assessment Services) Patient's cognitive ability adequate to safely complete daily activities?: Yes Patient able to express need for assistance with ADLs?: Yes Independently performs ADLs?: Yes (appropriate for developmental age)  Prior Inpatient Therapy Prior Inpatient Therapy: No Prior Therapy Dates: NA Prior Therapy Facilty/Provider(s): NA Reason for Treatment: NA  Prior Outpatient Therapy Prior Outpatient Therapy: No Prior Therapy Dates: NA Prior Therapy Facilty/Provider(s): NA Reason for Treatment: NA Does patient have an ACCT team?: No Does patient have Intensive In-House Services?  :  No Does patient have [facility] services? : No Does patient have P4CC services?: No  ADL Screening (condition at time of admission) Patient's cognitive ability adequate to safely complete daily activities?: Yes Is the patient  deaf or have difficulty hearing?: No Does the patient have difficulty seeing, even when wearing glasses/contacts?: No Does the patient have difficulty concentrating, remembering, or making decisions?: No Patient able to express need for assistance with ADLs?: Yes Does the patient have difficulty dressing or bathing?: No Independently performs ADLs?: Yes (appropriate for developmental age) Does the patient have difficulty walking or climbing stairs?: No Weakness of Legs: None Weakness of Arms/Hands: None  Home Assistive Devices/Equipment Home Assistive Devices/Equipment: None  Therapy Consults (therapy consults require a physician order) PT Evaluation Needed: No OT Evalulation Needed: No SLP Evaluation Needed: No Abuse/Neglect Assessment (Assessment to be complete while patient is alone) Physical Abuse: Denies Verbal Abuse: Denies Sexual Abuse: Denies Exploitation of patient/patient's resources: Denies Self-Neglect: Denies Values / Beliefs Cultural Requests During Hospitalization: None Spiritual Requests During Hospitalization: None Consults Spiritual Care Consult Needed: No Social Work Consult Needed: No Merchant navy officerAdvance Directives (For Healthcare) Does Patient Have a Medical Advance Directive?: No Would patient like information on creating a medical advance directive?: No - Patient declined    Additional Information 1:1 In Past 12 Months?: No CIRT Risk: Yes Elopement Risk: Yes Does patient have medical clearance?: Yes     Disposition: Case was staffed with Cresenciano GenreLord DNP and Pecola Leisureeese MD who recommended patient be re-evaluated in the a.m.      On Site Evaluation by:   Reviewed with Physician:    Alfredia Fergusonavid L Sylvan Lahm 08/19/2016 4:56 PM

## 2016-08-19 NOTE — ED Triage Notes (Signed)
Patient from home who says she has periods of intense rage in which she feels like hurting others.  Patient has a three year old and is currently [redacted] weeks pregnant.  Patient also today had feelings like she wanted to hurt herself but no specific plan.  She reports this has been going on for as long as she can remember but she has never received any help.  Patient denies AVH.

## 2016-08-19 NOTE — ED Notes (Signed)
Report to include Situation, Background, Assessment, and Recommendations received from Edie RN. Patient alert and oriented, warm and dry, in no acute distress. Patient denies SI, HI, AVH and pain. Patient made aware of Q15 minute rounds and security cameras for their safety. Patient instructed to come to me with needs or concerns. 

## 2016-08-19 NOTE — ED Notes (Signed)
Bed: [facility] Expected date:  Expected time:  Means of arrival:  Comments: 

## 2016-08-19 NOTE — ED Notes (Signed)
Hourly rounding reveals patient in room. No complaints, stable, in no acute distress. Q15 minute rounds and monitoring via Security Cameras to continue. 

## 2016-08-19 NOTE — ED Notes (Signed)
This Engineer, manufacturingwriter info PT unable to keep cell phone have to be place with belonging in locker. PT became upset state she have to have her cell phone with her at all time does not want to call anyone off hospital phone. PT request to leave TCU RN info MD Adela LankFloyd. Triage staff made aware MD have IVC PT. PT place back in triage room waiting for MD

## 2016-08-19 NOTE — ED Notes (Signed)
Hourly rounding reveals patient in room. No complaints, stable, in no acute distress. Q15 minute rounds and monitoring via Security Cameras to continue.Snack and beverage given. 

## 2016-08-19 NOTE — ED Notes (Signed)
Per day shift RN patient requesting non disclosure of info.

## 2016-08-19 NOTE — ED Provider Notes (Signed)
[facility]ENC[facility] DEPT Provider Note   CSN: [id number] Arrival date & time: 08/19/16  1339     History   Chief Complaint Chief Complaint  Patient presents with  . Suicidal    HPI Debbie Mercer is a 21 y.o. female.  21 yo F with a chief complaint of suicidal ideation. This been going on for the past few days to a week. Patient states that things at home have gotten unbearable. She is having outbursts of anger as well as feels that she wants to hurt someone as well as herself. She has no plan to do this to herself or any specific person. Denies prior psychiatric illness. Denies hallucinations. Denies dysuria or increased frequency or hesitancy. Denies vaginal bleeding or discharge. Denies abdominal pain or cramping. Patient is about [redacted] weeks pregnant. Has seen OB and has had confirmatory IUP.   The history is provided by the patient.  Illness  This is a new problem. The current episode started 2 days ago. The problem occurs constantly. The problem has not changed since onset.Pertinent negatives include no chest pain, no headaches and no shortness of breath. Nothing aggravates the symptoms. Nothing relieves the symptoms. She has tried nothing for the symptoms. The treatment provided no relief.    Past Medical History:  Diagnosis Date  . Medical history non-contributory     Patient Active Problem List   Diagnosis Date Noted  . Vitamin D deficiency 08/02/2016  . Supervision of other normal pregnancy, antepartum 07/28/2016  . History of acute pyelonephritis 07/28/2016  . 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 GERD (gastroesophageal reflux disease) 03/05/2013    Past Surgical History:  Procedure Laterality Date  . BREAST LUMPECTOMY      OB History    Gravida Para Term Preterm AB Living   3 1 1   1 1    SAB TAB Ectopic Multiple Live Births   1       1       Home Medications    Prior to Admission medications   Medication Sig Start Date  End Date Taking? Authorizing Provider  Prenatal Vit-Fe Fumarate-FA (PRENATAL COMPLETE) 14-0.4 MG TABS Take 1 tablet by mouth daily. 06/29/16  Yes Little, Ambrose Finlandachel Morgan, MD  ondansetron (ZOFRAN-ODT) 4 MG disintegrating tablet dissolve 1 tablet under the tongue every 8 hours if needed for nausea and vomiting 06/29/16   [provider]  promethazine (PHENERGAN) 25 MG tablet take 1 tablet by mouth every 6 hours if needed for nausea and vomiting 06/30/16   [provider]  Vitamin D, Ergocalciferol, (DRISDOL) 50000 units CAPS capsule Take 1 capsule (50,000 Units total) by mouth every 7 (seven) days. Patient not taking: Reported on 08/12/2016 08/02/16   Roe Coombsenney, Rachelle A, CNM    Family History Family History  Problem Relation Age of Onset  . Hypertension Mother   . Diabetes Maternal Grandmother   . Anemia Maternal Grandmother     Social History Social History  Substance Use Topics  . Smoking status: Never Smoker  . Smokeless tobacco: Never Used  . Alcohol use No     Allergies   Shellfish allergy   Review of Systems Review of Systems  Constitutional: Negative for chills and fever.  HENT: Negative for congestion and rhinorrhea.   Eyes: Negative for redness and visual disturbance.  Respiratory: Negative for shortness of breath and wheezing.   Cardiovascular: Negative for chest pain and palpitations.  Gastrointestinal: Negative for nausea and vomiting.  Genitourinary: Negative for dysuria and urgency.  Musculoskeletal: Negative for arthralgias and myalgias.  Skin: Negative for pallor and wound.  Neurological: Negative for dizziness and headaches.     Physical Exam Updated Vital Signs BP 114/64 (BP Location: Left Arm)   Pulse 86   Temp 98.4 F (36.9 C) (Oral)   Resp 18   Ht 5\' 4"  (1.626 m)   Wt 78.9 kg (174 lb)   LMP 05/16/2016   SpO2 100%   BMI 29.87 kg/m   Physical Exam  Constitutional: She is oriented to person, place, and time. She appears well-developed  and well-nourished. No distress.  HENT:  Head: Normocephalic and atraumatic.  Eyes: Pupils are equal, round, and reactive to light. EOM are normal.  Neck: Normal range of motion. Neck supple.  Cardiovascular: Normal rate and regular rhythm.  Exam reveals no gallop and no friction rub.   No murmur heard. Pulmonary/Chest: Effort normal. She has no wheezes. She has no rales.  Abdominal: Soft. She exhibits no distension and no mass. There is no tenderness. There is no guarding.  Musculoskeletal: She exhibits no edema or tenderness.  Neurological: She is alert and oriented to person, place, and time.  Skin: Skin is warm and dry. She is not diaphoretic.  Psychiatric: Her behavior is normal. Her affect is blunt. She expresses impulsivity. She exhibits a depressed mood. She expresses homicidal and suicidal ideation. She expresses no suicidal plans and no homicidal plans.  Nursing note and vitals reviewed.    ED Treatments / Results  Labs (all labs ordered are listed, but only abnormal results are displayed) Labs Reviewed  URINALYSIS, ROUTINE W REFLEX MICROSCOPIC - Abnormal; Notable for the following:       Result Value   APPearance CLOUDY (*)    Leukocytes, UA SMALL (*)    Bacteria, UA MANY (*)    Squamous Epithelial / LPF 6-30 (*)    All other components within normal limits  I-STAT BETA HCG BLOOD, ED ([facility], [facility], [facility] ONLY) - Abnormal; Notable for the following:    I-stat hCG, quantitative >2,000.0 (*)    All other components within normal limits    EKG  EKG Interpretation None       Radiology No results found.  Procedures Procedures (including critical care time)  Medications Ordered in ED Medications - No data to display   Initial Impression / Assessment and Plan / ED Course  I have reviewed the triage vital signs and the nursing notes.  Pertinent labs & imaging results that were available during my care of the patient were reviewed by me and considered in my medical  decision making (see chart for details).     21 yo F With a chief complaints of suicidal and homicidal ideation. She has no current complaints concerning with her pregnancy.    When patient was asked to turn over her cell phone she became acutely agitated, asking to leave.  As patient came in suicidal I do not feel she is safe for d/c.  IVC'd.  Awaiting psych eval.   The patients results and plan were reviewed and discussed.   Any x-rays performed were independently reviewed by myself.   Differential diagnosis were considered with the presenting HPI.  Medications - No data to display  Vitals:   08/19/16 1341 08/19/16 1347 08/19/16 2104 08/20/16 0634  BP: 126/76  (!) 113/58 114/64  Pulse: 96  73 86  Resp: 17  18 18   Temp: 98.8 F (37.1 C)  99.3  F (37.4 C) 98.4 F (36.9 C)  TempSrc:   Oral Oral  SpO2: 100%  100% 100%  Weight:  78.9 kg (174 lb)    Height:  5\' 4"  (1.626 m)      Final diagnoses:  Suicidal ideation     Final Clinical Impressions(s) / ED Diagnoses   Final diagnoses:  Suicidal ideation    New Prescriptions New Prescriptions   No medications on file     Melene PlanFloyd, Jaretzy Lhommedieu, DO 08/20/16 0708

## 2016-08-20 DIAGNOSIS — F329 Major depressive disorder, single episode, unspecified: Secondary | ICD-10-CM | POA: Diagnosis not present

## 2016-08-20 DIAGNOSIS — F1290 Cannabis use, unspecified, uncomplicated: Secondary | ICD-10-CM | POA: Diagnosis not present

## 2016-08-20 DIAGNOSIS — F4325 Adjustment disorder with mixed disturbance of emotions and conduct: Secondary | ICD-10-CM | POA: Diagnosis not present

## 2016-08-20 LAB — CBC WITH DIFFERENTIAL/PLATELET
Basophils Absolute: 0 10*3/uL (ref 0.0–0.1)
Basophils Relative: 0 %
Eosinophils Absolute: 0.1 10*3/uL (ref 0.0–0.7)
Eosinophils Relative: 1 %
HCT: 35.3 % — ABNORMAL LOW (ref 36.0–46.0)
Hemoglobin: 11.6 g/dL — ABNORMAL LOW (ref 12.0–15.0)
Lymphocytes Relative: 17 %
Lymphs Abs: 1.4 10*3/uL (ref 0.7–4.0)
MCH: 23.6 pg — ABNORMAL LOW (ref 26.0–34.0)
MCHC: 32.9 g/dL (ref 30.0–36.0)
MCV: 71.7 fL — ABNORMAL LOW (ref 78.0–100.0)
Monocytes Absolute: 0.9 10*3/uL (ref 0.1–1.0)
Monocytes Relative: 10 %
Neutro Abs: 6.1 10*3/uL (ref 1.7–7.7)
Neutrophils Relative %: 72 %
Platelets: 288 10*3/uL (ref 150–400)
RBC: 4.92 MIL/uL (ref 3.87–5.11)
RDW: 13.2 % (ref 11.5–15.5)
WBC: 8.5 10*3/uL (ref 4.0–10.5)

## 2016-08-20 LAB — COMPREHENSIVE METABOLIC PANEL
ALT: 18 U/L (ref 14–54)
AST: 22 U/L (ref 15–41)
Albumin: 3.6 g/dL (ref 3.5–5.0)
Alkaline Phosphatase: 50 U/L (ref 38–126)
Anion gap: 9 (ref 5–15)
BUN: 8 mg/dL (ref 6–20)
CO2: 22 mmol/L (ref 22–32)
Calcium: 9.2 mg/dL (ref 8.9–10.3)
Chloride: 105 mmol/L (ref 101–111)
Creatinine, Ser: 0.52 mg/dL (ref 0.44–1.00)
GFR calc Af Amer: >60 mL/min (ref >60)
GFR calc non Af Amer: >60 mL/min (ref >60)
Glucose, Bld: 99 mg/dL (ref 65–99)
Potassium: 3.4 mmol/L — ABNORMAL LOW (ref 3.5–5.1)
Sodium: 136 mmol/L (ref 135–145)
Total Bilirubin: 0.8 mg/dL (ref 0.3–1.2)
Total Protein: 7 g/dL (ref 6.5–8.1)

## 2016-08-20 LAB — RAPID URINE DRUG SCREEN, HOSP PERFORMED
Amphetamines: NOT DETECTED
Barbiturates: NOT DETECTED
Benzodiazepines: NOT DETECTED
Cocaine: NOT DETECTED
Opiates: NOT DETECTED
Tetrahydrocannabinol: POSITIVE — AB

## 2016-08-20 LAB — ETHANOL: Alcohol, Ethyl (B): <5 mg/dL (ref <5)

## 2016-08-20 NOTE — ED Notes (Signed)
Pt sitting quielty in room.  Pt denies si/hi and that she told anyone that she was in danger of harming herself.  Pt reports that she came her to talk to someone about her anger and to get resources to help her with her anger. "just needed somebody to talk to.Marland Kitchen.Marland Kitchen."

## 2016-08-20 NOTE — ED Notes (Signed)
Hourly rounding reveals patient sleeping in room. No complaints, stable, in no acute distress. Q15 minute rounds and monitoring via Security Cameras to continue. 

## 2016-08-20 NOTE — ED Notes (Signed)
Hourly rounding reveals patient sleeping room. No complaints, stable, in no acute distress. Q15 minute rounds and monitoring via Security Cameras to continue. 

## 2016-08-20 NOTE — Progress Notes (Signed)
CSW filed patient's notice of commitment change paperwork into IVC logbook.   Jodi Kappes, LCSWA [facility]  Clinical Social Worker [phone] 

## 2016-08-20 NOTE — ED Notes (Signed)
Pt's boy friend into see 

## 2016-08-20 NOTE — ED Notes (Addendum)
Dr Laurena BeringAkintyo, Jamison DNP, TTS, and this RN into see.  Pt denies si/hi/avh at this time, and reports that she feels better today.  Pt reports that she was fired from her job yesterday because of absences related to her pregnancy and that she was angry and mad. Pt reports that she came to the ED to get resources to for people to talk to. Pt denies si/hi.

## 2016-08-20 NOTE — ED Notes (Addendum)
Pt ambulatory w/o difficulty to dc area with mHt, belongings returned after leaving the area. 

## 2016-08-20 NOTE — Consult Note (Signed)
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 Vitamin D deficiency [E55.9] 08/02/2016  . Supervision of other normal pregnancy, antepartum [Z34.80] 07/28/2016  . History of acute pyelonephritis [Z87.440] 07/28/2016  . Shellfish allergy [Z91.013] 03/05/2013  . S/P lumpectomy of breast [Z98.890] 03/05/2013  . GERD (gastroesophageal reflux disease) [K21.9] 03/05/2013    Total Time spent with patient: 45 minutes  Subjective:   Debbie Mercer is a 21 y.o. female patient does not warrant admission.  HPI:  21 yo female who came to the ED to talk to someone about "better ways to deal with my anger."  She called [facility] who told her to come here.  When she got here and was told she was going to have to stay, she got upset because she only wanted to talk to someone.  Reports feeling better yesterday after talking to the counselor but the EDP IVC'd her.  Today, she is calm and cooperative with no suicidal/homicidal ideations, hallucinations, or recent substance abuse.  She was upset with her boss yesterday for firing her due to her doctor visits despite having a doctor's note.  She is currently pregnant with a 55 year old child.  Positive social support, stable for discharge.  Past Psychiatric History: none  Risk to Self: None Risk to Others: None Prior Inpatient Therapy: Prior Inpatient Therapy: No Prior Therapy Dates: NA Prior Therapy Facilty/Provider(s): NA Reason for Treatment: NA Prior Outpatient Therapy: Prior Outpatient Therapy: No Prior Therapy Dates: NA Prior Therapy  Facilty/Provider(s): NA Reason for Treatment: NA Does patient have an ACCT team?: No Does patient have Intensive In-House Services?  : No Does patient have [facility] services? : No Does patient have [facility] services?: No  Past Medical History:  Past Medical History:  Diagnosis Date  . Medical history non-contributory     Past Surgical History:  Procedure Laterality Date  . BREAST LUMPECTOMY     Family History:  Family History  Problem Relation Age of Onset  . Hypertension Mother   . Diabetes Maternal Grandmother   . Anemia Maternal Grandmother    Family Psychiatric  History: unknown Social History:  History  Alcohol Use No     History  Drug Use  . Types: Marijuana    Comment: occasional but not recently    Social History   Social History  . Marital status: Single    Spouse name: N/A  . Number of children: N/A  . Years of education: N/A   Social History Main Topics  . Smoking status: Never Smoker  . Smokeless tobacco: Never Used  . Alcohol use No  . Drug use: Yes    Types: Marijuana     Comment: occasional but not recently  . Sexual activity: Yes    Birth control/ protection: None   Other Topics Concern  . None   Social History Narrative  . None   Additional Social History:    Allergies:   Allergies  Allergen Reactions  . Shellfish Allergy Anaphylaxis and Itching    Labs:  Results for orders placed or performed during the hospital encounter of 08/19/16 (from the past  48 hour(s))  I-Stat beta hCG blood, ED     Status: Abnormal   Collection Time: 08/19/16  2:15 PM  Result Value Ref Range   I-stat hCG, quantitative >2,000.0 (H) <5 mIU/mL   Comment 3            Comment:   GEST. AGE      CONC.  (mIU/mL)   <=1 WEEK        5 - 50     2 WEEKS       50 - 500     3 WEEKS       100 - 10,000     4 WEEKS     1,000 - 30,000        FEMALE AND NON-PREGNANT FEMALE:     LESS THAN 5 mIU/mL   Urinalysis, Routine w reflex microscopic     Status: Abnormal    Collection Time: 08/19/16  2:42 PM  Result Value Ref Range   Color, Urine YELLOW YELLOW   APPearance CLOUDY (A) CLEAR   Specific Gravity, Urine 1.028 1.005 - 1.030   pH 7.0 5.0 - 8.0   Glucose, UA NEGATIVE NEGATIVE mg/dL   Hgb urine dipstick NEGATIVE NEGATIVE   Bilirubin Urine NEGATIVE NEGATIVE   Ketones, ur NEGATIVE NEGATIVE mg/dL   Protein, ur NEGATIVE NEGATIVE mg/dL   Nitrite NEGATIVE NEGATIVE   Leukocytes, UA SMALL (A) NEGATIVE   RBC / HPF 0-5 0 - 5 RBC/hpf   WBC, UA 0-5 0 - 5 WBC/hpf   Bacteria, UA MANY (A) NONE SEEN   Squamous Epithelial / LPF 6-30 (A) NONE SEEN   Mucous PRESENT   Urine rapid drug screen (hosp performed)     Status: Abnormal   Collection Time: 08/19/16  2:42 PM  Result Value Ref Range   Opiates NONE DETECTED NONE DETECTED   Cocaine NONE DETECTED NONE DETECTED   Benzodiazepines NONE DETECTED NONE DETECTED   Amphetamines NONE DETECTED NONE DETECTED   Tetrahydrocannabinol POSITIVE (A) NONE DETECTED   Barbiturates NONE DETECTED NONE DETECTED    Comment:        DRUG SCREEN FOR MEDICAL PURPOSES ONLY.  IF CONFIRMATION IS NEEDED FOR ANY PURPOSE, NOTIFY LAB WITHIN 5 DAYS.        LOWEST DETECTABLE LIMITS FOR URINE DRUG SCREEN Drug Class       Cutoff (ng/mL) Amphetamine      1000 Barbiturate      200 Benzodiazepine   093 Tricyclics       818 Opiates          300 Cocaine          300 THC              50   Comprehensive metabolic panel     Status: Abnormal   Collection Time: 08/20/16  8:55 AM  Result Value Ref Range   Sodium 136 135 - 145 mmol/L   Potassium 3.4 (L) 3.5 - 5.1 mmol/L   Chloride 105 101 - 111 mmol/L   CO2 22 22 - 32 mmol/L   Glucose, Bld 99 65 - 99 mg/dL   BUN 8 6 - 20 mg/dL   Creatinine, Ser 0.52 0.44 - 1.00 mg/dL   Calcium 9.2 8.9 - 10.3 mg/dL   Total Protein 7.0 6.5 - 8.1 g/dL   Albumin 3.6 3.5 - 5.0 g/dL   AST 22 15 - 41 U/L   ALT 18 14 - 54 U/L   Alkaline Phosphatase 50 38 - 126  U/L   Total Bilirubin 0.8 0.3 - 1.2 mg/dL   GFR  calc non Af Amer >60 >60 mL/min   GFR calc Af Amer >60 >60 mL/min    Comment: (NOTE) The eGFR has been calculated using the CKD EPI equation. This calculation has not been validated in all clinical situations. eGFR's persistently <60 mL/min signify possible Chronic Kidney Disease.    Anion gap 9 5 - 15  Ethanol     Status: None   Collection Time: 08/20/16  8:55 AM  Result Value Ref Range   Alcohol, Ethyl (B) <5 <5 mg/dL    Comment:        LOWEST DETECTABLE LIMIT FOR SERUM ALCOHOL IS 5 mg/dL FOR MEDICAL PURPOSES ONLY   CBC with Diff     Status: Abnormal   Collection Time: 08/20/16  8:55 AM  Result Value Ref Range   WBC 8.5 4.0 - 10.5 K/uL   RBC 4.92 3.87 - 5.11 MIL/uL   Hemoglobin 11.6 (L) 12.0 - 15.0 g/dL   HCT 35.3 (L) 36.0 - 46.0 %   MCV 71.7 (L) 78.0 - 100.0 fL   MCH 23.6 (L) 26.0 - 34.0 pg   MCHC 32.9 30.0 - 36.0 g/dL   RDW 13.2 11.5 - 15.5 %   Platelets 288 150 - 400 K/uL   Neutrophils Relative % 72 %   Lymphocytes Relative 17 %   Monocytes Relative 10 %   Eosinophils Relative 1 %   Basophils Relative 0 %   Neutro Abs 6.1 1.7 - 7.7 K/uL   Lymphs Abs 1.4 0.7 - 4.0 K/uL   Monocytes Absolute 0.9 0.1 - 1.0 K/uL   Eosinophils Absolute 0.1 0.0 - 0.7 K/uL   Basophils Absolute 0.0 0.0 - 0.1 K/uL   Smear Review MORPHOLOGY UNREMARKABLE     No current facility-administered medications for this encounter.    Current Outpatient Prescriptions  Medication Sig Dispense Refill  . Prenatal Vit-Fe Fumarate-FA (PRENATAL COMPLETE) 14-0.4 MG TABS Take 1 tablet by mouth daily. 60 each 0  . ondansetron (ZOFRAN-ODT) 4 MG disintegrating tablet dissolve 1 tablet under the tongue every 8 hours if needed for nausea and vomiting  0  . promethazine (PHENERGAN) 25 MG tablet take 1 tablet by mouth every 6 hours if needed for nausea and vomiting  0  . Vitamin D, Ergocalciferol, (DRISDOL) 50000 units CAPS capsule Take 1 capsule (50,000 Units total) by mouth every 7 (seven) days. (Patient not  taking: Reported on 08/12/2016) 30 capsule 2    Musculoskeletal: Strength & Muscle Tone: within normal limits Gait & Station: normal Patient leans: N/A  Psychiatric Specialty Exam: Physical Exam  Constitutional: She is oriented to person, place, and time. She appears well-developed and well-nourished.  HENT:  Head: Normocephalic.  Neck: Normal range of motion.  Respiratory: Effort normal.  Musculoskeletal: Normal range of motion.  Neurological: She is alert and oriented to person, place, and time.  Psychiatric: She has a normal mood and affect. Her speech is normal and behavior is normal. Judgment and thought content normal. Cognition and memory are normal.    Review of Systems  All other systems reviewed and are negative.   Blood pressure 114/64, pulse 86, temperature 98.4 F (36.9 C), temperature source Oral, resp. rate 18, height _0  (1.626 m), weight 78.9 kg (174 lb), last menstrual period 05/16/2016, SpO2 100 %, unknown if currently breastfeeding.Body mass index is 29.87 kg/m.  General Appearance: Casual  Eye Contact:  Good  Speech:  Normal Rate  Volume:  Normal  Mood:  Euthymic  Affect:  Congruent  Thought Process:  Coherent and Descriptions of Associations: Intact  Orientation:  Full (Time, Place, and Person)  Thought Content:  WDL and Logical  Suicidal Thoughts:  No  Homicidal Thoughts:  No  Memory:  Immediate;   Good Recent;   Good Remote;   Good  Judgement:  Fair  Insight:  Good  Psychomotor Activity:  Normal  Concentration:  Concentration: Good and Attention Span: Good  Recall:  Good  Fund of Knowledge:  Good  Language:  Good  Akathisia:  No  Handed:  Right  AIMS (if indicated):     Assets:  Housing Leisure Time Physical Health Resilience Social Support  ADL's:  Intact  Cognition:  WNL  Sleep:        Treatment Plan Summary: Daily contact with patient to assess and evaluate symptoms and progress in treatment, Medication management and Plan  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    Waylan Boga, NP 08/20/2016 10:44 AM  Patient seen face-to-face for psychiatric evaluation, chart reviewed and case discussed with the physician extender and developed treatment plan. Reviewed the information documented and agree with the treatment plan. Corena Pilgrim, MD

## 2016-08-20 NOTE — ED Notes (Signed)
Patient authorized XXX to be removed. Patient does not want family to have any information released about her care.

## 2016-08-20 NOTE — ED Notes (Signed)
Lab into draw, pt tolerated well 

## 2016-08-20 NOTE — Discharge Instructions (Signed)
For your ongoing mental health needs, you are advised to follow up with [facility]. Walk in hours are Monday - Friday: 8:30am-12:00pm / 1:00pm-2:30pm. ° °[facility] °[address] °[place], [place] [address] °[phone] °

## 2016-08-20 NOTE — ED Notes (Signed)
Written dc instructions and follow up reviewed with patient.  Pt encouraged to follow up Monday with family services.  Pt also given mobile crisis number.  Pt encouraged to seek treatment/contact mobile crisis it she has suicidal thoughts/urges or other difficulties concerning her anger. PT also encouraged to contact her OB for any changes in her pain/dc/bleeding or other problems related to the pregnancy.  Pt verbalized understanding and stated that she would do so.

## 2016-08-20 NOTE — ED Notes (Signed)
Up to the bathroom 

## 2016-08-20 NOTE — ED Notes (Signed)
IVC will be rescinded per Catha NottinghamJamison DNP

## 2016-08-20 NOTE — [facility] Suicide Risk Assessment (Signed)
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 Vitamin D deficiency [E55.9] 08/02/2016  . Supervision of other normal pregnancy, antepartum [Z34.80] 07/28/2016  . History of acute pyelonephritis [Z87.440] 07/28/2016  . Shellfish allergy [Z91.013] 03/05/2013  . S/P lumpectomy of breast [Z98.890] 03/05/2013  . GERD (gastroesophageal reflux disease) [K21.9] 03/05/2013    Total Time spent with patient: 45 minutes  Musculoskeletal: Strength & Muscle Tone: within normal limits Gait & Station: normal Patient leans: N/A  Psychiatric Specialty Exam: Physical Exam  Constitutional: She is oriented to person, place, and time. She appears well-developed and well-nourished.  HENT:  Head: Normocephalic.  Neck: Normal range of motion.  Respiratory: Effort normal.  Musculoskeletal: Normal range of motion.  Neurological: She is alert and oriented to person, place, and time.  Psychiatric: She has a normal mood and affect. Her speech is normal and behavior is normal. Judgment and thought content normal. Cognition and memory are normal.    Review of Systems  All other systems reviewed and are negative.   Blood pressure 114/64, pulse 86, temperature 98.4 F (36.9 C), temperature source Oral, resp. rate 18, height 5\' 4"  (1.626 m), weight 78.9 kg (174 lb), last menstrual period 05/16/2016, SpO2 100 %, unknown if currently breastfeeding.Body mass index is 29.87 kg/m.  General Appearance: Casual  Eye Contact:  Good  Speech:  Normal Rate  Volume:  Normal  Mood:  Euthymic  Affect:  Congruent  Thought Process:  Coherent and Descriptions of Associations: Intact  Orientation:   Full (Time, Place, and Person)  Thought Content:  WDL and Logical  Suicidal Thoughts:  No  Homicidal Thoughts:  No  Memory:  Immediate;   Good Recent;   Good Remote;   Good  Judgement:  Fair  Insight:  Good  Psychomotor Activity:  Normal  Concentration:  Concentration: Good and Attention Span: Good  Recall:  Good  Fund of Knowledge:  Good  Language:  Good  Akathisia:  No  Handed:  Right  AIMS (if indicated):     Assets:  Housing Leisure Time Physical Health Resilience Social Support  ADL's:  Intact  Cognition:  WNL  Sleep:       Mental Status Per Nursing Assessment::   On Admission:   needed to someone to talk to  Demographic Factors:  Adolescent or young adult  Loss Factors: NA  Historical Factors: NA  Risk Reduction Factors:   Pregnancy, Responsible for children under 23 years of age, Sense of responsibility to family and Positive social support  Continued Clinical Symptoms:  None   Cognitive Features That Contribute To Risk:  None    Suicide Risk:  Minimal: No identifiable suicidal ideation.  Patients presenting with no risk factors but with morbid ruminations; may be classified as minimal risk based on the severity of the depressive symptoms    Plan Of Care/Follow-up recommendations:  Activity:  as tolerated Diet:  heart healthy diet  LORD, JAMISON, NP 08/20/2016, 11:29 AM

## 2016-08-25 ENCOUNTER — Ambulatory Visit (INDEPENDENT_AMBULATORY_CARE_PROVIDER_SITE_OTHER): Payer: BLUE CROSS/BLUE SHIELD | Admitting: Certified Nurse Midwife

## 2016-08-25 ENCOUNTER — Encounter: Payer: Self-pay | Admitting: Certified Nurse Midwife

## 2016-08-25 VITALS — BP 99/66 | HR 90 | Temp 99.3°F | Wt 170.3 lb

## 2016-08-25 DIAGNOSIS — Z3482 Encounter for supervision of other normal pregnancy, second trimester: Secondary | ICD-10-CM

## 2016-08-25 DIAGNOSIS — Z3480 Encounter for supervision of other normal pregnancy, unspecified trimester: Secondary | ICD-10-CM

## 2016-08-25 DIAGNOSIS — E559 Vitamin D deficiency, unspecified: Secondary | ICD-10-CM

## 2016-08-25 DIAGNOSIS — F1210 Cannabis abuse, uncomplicated: Secondary | ICD-10-CM

## 2016-08-25 NOTE — Progress Notes (Signed)
Patient reports no pain, complains of diarrhea for the past week.

## 2016-08-25 NOTE — Progress Notes (Signed)
   PRENATAL VISIT NOTE  Subjective:  Debbie Mercer is a 21 y.o. G3P1011 at 4252w3d being seen today for ongoing prenatal care.  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  Patient reports no bleeding, no contractions, no cramping, no leaking and diarrhea for 1 week; BRATT diet discussed and OTC immodium.  Contractions: Not present. Vag. Bleeding: None.  Movement: Absent. Denies leaking of fluid.   The following portions of the patient's history were reviewed and updated as appropriate: allergies, current medications, past family history, past medical history, past social history, past surgical history and problem list. Problem list updated.  Objective:   Vitals:   08/25/16 1348  BP: 99/66  Pulse: 90  Temp: 99.3 F (37.4 C)  Weight: 170 lb 4.8 oz (77.2 kg)    Fetal Status: Fetal Heart Rate (bpm): 154; doppler   Movement: Absent     General:  Alert, oriented and cooperative. Patient is in no acute distress.  Skin: Skin is warm and dry. No rash noted.   Cardiovascular: Normal heart rate noted  Respiratory: Normal respiratory effort, no problems with respiration noted  Abdomen: Soft, gravid, appropriate for gestational age.  Pain/Pressure: Absent     Pelvic: Cervical exam deferred        Extremities: Normal range of motion.  Edema: None  Mental Status:  Normal mood and affect. Normal behavior. Normal judgment and thought content.   Assessment and Plan:  Pregnancy: G3P1011 at 6152w3d  1. Supervision of other normal pregnancy, antepartum      - US MFM OB COMP + 14 WK; Future  2. Vitamin D deficiency      Taking weekly  vitamin D  3. Mild tetrahydrocannabinol (THC) abuse     Dx at hospital, +UDS.   Preterm labor symptoms and general obstetric precautions including but not limited to vaginal bleeding, contractions, leaking of fluid and fetal movement were reviewed in detail with the patient. Please refer to After Visit Summary for other counseling recommendations.  Return in about 4 weeks (around 09/22/2016) for ROB.   Roe Coombsachelle A Ardra Kuznicki, CNM

## 2016-09-22 ENCOUNTER — Encounter: Payer: Self-pay | Admitting: Certified Nurse Midwife

## 2016-09-27 ENCOUNTER — Ambulatory Visit (HOSPITAL_COMMUNITY): Payer: BLUE CROSS/BLUE SHIELD

## 2016-09-27 ENCOUNTER — Encounter (HOSPITAL_COMMUNITY): Payer: Self-pay

## 2016-10-03 ENCOUNTER — Ambulatory Visit (HOSPITAL_COMMUNITY)
Admission: RE | Admit: 2016-10-03 | Discharge: 2016-10-03 | Disposition: A | Discharge status: Home / Self Care | Payer: BLUE CROSS/BLUE SHIELD | Source: Ambulatory Visit | Attending: Certified Nurse Midwife | Admitting: Certified Nurse Midwife

## 2016-10-03 DIAGNOSIS — Z3482 Encounter for supervision of other normal pregnancy, second trimester: Secondary | ICD-10-CM | POA: Diagnosis not present

## 2016-10-03 DIAGNOSIS — Z3480 Encounter for supervision of other normal pregnancy, unspecified trimester: Secondary | ICD-10-CM

## 2016-10-03 DIAGNOSIS — Z3A20 20 weeks gestation of pregnancy: Secondary | ICD-10-CM | POA: Insufficient documentation

## 2016-10-03 DIAGNOSIS — Z363 Encounter for antenatal screening for malformations: Secondary | ICD-10-CM | POA: Diagnosis present

## 2016-10-03 DIAGNOSIS — Z3689 Encounter for other specified antenatal screening: Secondary | ICD-10-CM | POA: Diagnosis present

## 2016-10-08 ENCOUNTER — Other Ambulatory Visit: Payer: Self-pay | Admitting: Certified Nurse Midwife

## 2016-10-08 DIAGNOSIS — Z3480 Encounter for supervision of other normal pregnancy, unspecified trimester: Secondary | ICD-10-CM

## 2016-10-11 ENCOUNTER — Telehealth: Payer: Self-pay | Admitting: *Deleted

## 2016-10-11 NOTE — Telephone Encounter (Signed)
Spoke with patient and she stated she started a new job and has not been able to take any time off yet. She could not do the time and date that we scheduled for her,stated she would call back when she finds out when she can take off.Marland KitchenMarland KitchenMarland Kitchen

## 2016-10-18 ENCOUNTER — Encounter: Payer: Self-pay | Admitting: Certified Nurse Midwife

## 2016-11-08 ENCOUNTER — Other Ambulatory Visit: Payer: Self-pay | Admitting: Certified Nurse Midwife

## 2016-11-08 ENCOUNTER — Ambulatory Visit (HOSPITAL_COMMUNITY)
Admission: RE | Admit: 2016-11-08 | Discharge: 2016-11-08 | Disposition: A | Discharge status: Home / Self Care | Payer: BLUE CROSS/BLUE SHIELD | Source: Ambulatory Visit | Attending: Certified Nurse Midwife | Admitting: Certified Nurse Midwife

## 2016-11-08 DIAGNOSIS — Z362 Encounter for other antenatal screening follow-up: Secondary | ICD-10-CM | POA: Diagnosis not present

## 2016-11-08 DIAGNOSIS — Z3480 Encounter for supervision of other normal pregnancy, unspecified trimester: Secondary | ICD-10-CM

## 2016-11-08 DIAGNOSIS — Z3A25 25 weeks gestation of pregnancy: Secondary | ICD-10-CM | POA: Diagnosis not present

## 2016-11-08 DIAGNOSIS — IMO0002 Reserved for concepts with insufficient information to code with codable children: Secondary | ICD-10-CM

## 2016-11-08 DIAGNOSIS — Z0489 Encounter for examination and observation for other specified reasons: Secondary | ICD-10-CM

## 2016-11-08 DIAGNOSIS — Z3482 Encounter for supervision of other normal pregnancy, second trimester: Secondary | ICD-10-CM | POA: Insufficient documentation

## 2016-11-09 ENCOUNTER — Other Ambulatory Visit: Payer: Self-pay | Admitting: Certified Nurse Midwife

## 2016-11-09 DIAGNOSIS — Z3480 Encounter for supervision of other normal pregnancy, unspecified trimester: Secondary | ICD-10-CM

## 2016-12-01 ENCOUNTER — Ambulatory Visit (INDEPENDENT_AMBULATORY_CARE_PROVIDER_SITE_OTHER): Payer: BLUE CROSS/BLUE SHIELD | Admitting: Certified Nurse Midwife

## 2016-12-01 ENCOUNTER — Encounter: Payer: Self-pay | Admitting: *Deleted

## 2016-12-01 ENCOUNTER — Encounter: Payer: Self-pay | Admitting: Certified Nurse Midwife

## 2016-12-01 VITALS — BP 114/75 | HR 94 | Wt 201.0 lb

## 2016-12-01 DIAGNOSIS — Z3480 Encounter for supervision of other normal pregnancy, unspecified trimester: Secondary | ICD-10-CM

## 2016-12-01 DIAGNOSIS — O0930 Supervision of pregnancy with insufficient antenatal care, unspecified trimester: Secondary | ICD-10-CM | POA: Insufficient documentation

## 2016-12-01 DIAGNOSIS — Z3483 Encounter for supervision of other normal pregnancy, third trimester: Secondary | ICD-10-CM

## 2016-12-01 DIAGNOSIS — O0933 Supervision of pregnancy with insufficient antenatal care, third trimester: Secondary | ICD-10-CM

## 2016-12-01 DIAGNOSIS — E559 Vitamin D deficiency, unspecified: Secondary | ICD-10-CM

## 2016-12-01 NOTE — Progress Notes (Signed)
Patient reports good fetal movement with occasional irritability. Pt states that she has not been seen since 08-25-16 because she did not want to miss work.

## 2016-12-01 NOTE — Progress Notes (Signed)
   PRENATAL VISIT NOTE  Subjective:  Debbie Mercer is a 21 y.o. G3P1011 at 2256w3d being seen today for ongoing prenatal care.  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  Patient reports no complaints.  Contractions: Irritability. Vag. Bleeding: None.  Movement: Present. Denies leaking of fluid.   The following portions of the patient's history were reviewed and updated as appropriate: allergies, current medications, past family history, past medical history, past social history, past surgical history and problem list. Problem list updated.  Objective:   Vitals:   12/01/16 1554  BP: 114/75  Pulse: 94  Weight: 201 lb (91.2 kg)    Fetal Status: Fetal Heart Rate (bpm): 152; doppler Fundal Height: 28 cm Movement: Present     General:  Alert, oriented and cooperative. Patient is in no acute distress.  Skin: Skin is warm and dry. No rash noted.   Cardiovascular: Normal heart rate noted  Respiratory: Normal respiratory effort, no problems with respiration noted  Abdomen: Soft, gravid, appropriate for gestational age.  Pain/Pressure: Absent     Pelvic: Cervical exam deferred        Extremities: Normal range of motion.  Edema: Trace  Mental Status:  Normal mood and affect. Normal behavior. Normal judgment and thought content.   Assessment and Plan:  Pregnancy: G3P1011 at 5156w3d  1. Supervision of other normal pregnancy, antepartum     Doing well.   Inadequate prenatal care in the second trimester d/t work committments.   2. Vitamin D deficiency     Taking weekly vitamin D.   Preterm labor symptoms and general obstetric  precautions including but not limited to vaginal bleeding, contractions, leaking of fluid and fetal movement were reviewed in detail with the patient. Please refer to After Visit Summary for other counseling recommendations.  Return in about 2 weeks (around 12/15/2016) for ROB, 2 hr OGTT ASAP.   Roe Coombsachelle A Jamise Pentland, CNM

## 2016-12-05 ENCOUNTER — Encounter: Payer: Self-pay | Admitting: *Deleted

## 2016-12-05 ENCOUNTER — Other Ambulatory Visit: Payer: BLUE CROSS/BLUE SHIELD

## 2016-12-05 DIAGNOSIS — Z3480 Encounter for supervision of other normal pregnancy, unspecified trimester: Secondary | ICD-10-CM

## 2016-12-06 LAB — GLUCOSE TOLERANCE, 2 HOURS W/ 1HR
GLUCOSE, FASTING: 80 mg/dL (ref 65–91)
Glucose, 1 hour: 136 mg/dL (ref 65–179)
Glucose, 2 hour: 100 mg/dL (ref 65–152)

## 2016-12-06 LAB — CBC
Hematocrit: 34.2 % (ref 34.0–46.6)
Hemoglobin: 10.8 g/dL — ABNORMAL LOW (ref 11.1–15.9)
MCH: 23.5 pg — ABNORMAL LOW (ref 26.6–33.0)
MCHC: 31.6 g/dL (ref 31.5–35.7)
MCV: 75 fL — AB (ref 79–97)
PLATELETS: 336 10*3/uL (ref 150–379)
RBC: 4.59 x10E6/uL (ref 3.77–5.28)
RDW: 14.6 % (ref 12.3–15.4)
WBC: 11.6 10*3/uL — AB (ref 3.4–10.8)

## 2016-12-06 LAB — HIV ANTIBODY (ROUTINE TESTING W REFLEX): HIV SCREEN 4TH GENERATION: NONREACTIVE

## 2016-12-06 LAB — RPR: RPR Ser Ql: NONREACTIVE

## 2016-12-08 ENCOUNTER — Other Ambulatory Visit: Payer: Self-pay | Admitting: Certified Nurse Midwife

## 2016-12-08 DIAGNOSIS — O99019 Anemia complicating pregnancy, unspecified trimester: Secondary | ICD-10-CM

## 2016-12-08 DIAGNOSIS — Z3480 Encounter for supervision of other normal pregnancy, unspecified trimester: Secondary | ICD-10-CM

## 2016-12-08 MED ORDER — CITRANATAL BLOOM 90-1 MG PO TABS: 1.0000 | ORAL_TABLET | Freq: Every day | ORAL | 12 refills | Status: DC

## 2016-12-12 ENCOUNTER — Encounter: Payer: Self-pay | Admitting: Certified Nurse Midwife

## 2016-12-12 ENCOUNTER — Other Ambulatory Visit: Payer: Self-pay

## 2016-12-12 ENCOUNTER — Ambulatory Visit (INDEPENDENT_AMBULATORY_CARE_PROVIDER_SITE_OTHER): Payer: BLUE CROSS/BLUE SHIELD | Admitting: Certified Nurse Midwife

## 2016-12-12 VITALS — BP 112/68 | HR 106 | Wt 203.0 lb

## 2016-12-12 DIAGNOSIS — Z3480 Encounter for supervision of other normal pregnancy, unspecified trimester: Secondary | ICD-10-CM

## 2016-12-12 DIAGNOSIS — R12 Heartburn: Secondary | ICD-10-CM

## 2016-12-12 DIAGNOSIS — O26893 Other specified pregnancy related conditions, third trimester: Secondary | ICD-10-CM

## 2016-12-12 DIAGNOSIS — Z3483 Encounter for supervision of other normal pregnancy, third trimester: Secondary | ICD-10-CM

## 2016-12-12 MED ORDER — OMEPRAZOLE 20 MG PO CPDR: 20.0000 mg | DELAYED_RELEASE_CAPSULE | Freq: Two times a day (BID) | ORAL | 5 refills | Status: DC

## 2016-12-12 NOTE — Progress Notes (Signed)
Complains of heartburns mainly at night x 61 days also thick, white discharge, no odor, burning, NV or itching.  Declined FLU and TDAP Vaccine.

## 2016-12-12 NOTE — Progress Notes (Signed)
   PRENATAL VISIT NOTE  Subjective:  Debbie Mercer is a 21 y.o. G3P1011 at 3154w0d being seen today for ongoing prenatal care.  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  Patient reports heartburn, no bleeding, no contractions, no cramping and no leaking.  Contractions: Irritability. Vag. Bleeding: None.  Movement: Present. Denies leaking of fluid.   The following portions of the patient's history were reviewed and updated as appropriate: allergies, current medications, past family history, past medical history, past social history, past surgical history and problem list. Problem list updated.  Objective:   Vitals:   12/12/16 1455  BP: 112/68  Pulse: (!) 106  Weight: 203 lb (92.1 kg)    Fetal Status: Fetal Heart Rate (bpm): 142; dopplerf Fundal Height: 30 cm Movement: Present     General:  Alert, oriented and cooperative. Patient is in no acute distress.  Skin: Skin is warm and dry. No rash noted.   Cardiovascular: Normal heart rate noted  Respiratory: Normal respiratory effort, no problems with respiration noted  Abdomen: Soft, gravid, appropriate for gestational age.  Pain/Pressure: Present     Pelvic: Cervical exam deferred        Extremities: Normal range of motion.  Edema: Trace  Mental Status:  Normal mood and affect. Normal behavior. Normal judgment and thought content.   Assessment and Plan:  Pregnancy: G3P1011 at 4254w0d  1. Supervision of other normal pregnancy, antepartum     Doing well.   2. Heartburn during pregnancy in third trimester     OTC Tums - omeprazole  (PRILOSEC) 20 MG capsule; Take 1 capsule (20 mg total) 2 (two) times daily before a meal by mouth.  Dispense: 60 capsule; Refill: 5  Preterm labor symptoms and general obstetric precautions including but not limited to vaginal bleeding, contractions, leaking of fluid and fetal movement were reviewed in detail with the patient. Please refer to After Visit Summary for other counseling recommendations.  Return in about 2 weeks (around 12/26/2016) for ROB.   Roe Coombsachelle A Cearra Portnoy, CNM

## 2017-01-02 ENCOUNTER — Encounter: Payer: Self-pay | Admitting: Certified Nurse Midwife

## 2017-01-11 ENCOUNTER — Encounter: Payer: Self-pay | Admitting: Certified Nurse Midwife

## 2017-01-11 ENCOUNTER — Ambulatory Visit (INDEPENDENT_AMBULATORY_CARE_PROVIDER_SITE_OTHER): Payer: BLUE CROSS/BLUE SHIELD | Admitting: Certified Nurse Midwife

## 2017-01-11 ENCOUNTER — Other Ambulatory Visit: Payer: Self-pay

## 2017-01-11 VITALS — BP 116/69 | HR 107 | Wt 216.0 lb

## 2017-01-11 DIAGNOSIS — Z3480 Encounter for supervision of other normal pregnancy, unspecified trimester: Secondary | ICD-10-CM

## 2017-01-11 DIAGNOSIS — O26893 Other specified pregnancy related conditions, third trimester: Secondary | ICD-10-CM

## 2017-01-11 DIAGNOSIS — E559 Vitamin D deficiency, unspecified: Secondary | ICD-10-CM

## 2017-01-11 DIAGNOSIS — Z3483 Encounter for supervision of other normal pregnancy, third trimester: Secondary | ICD-10-CM

## 2017-01-11 DIAGNOSIS — N949 Unspecified condition associated with female genital organs and menstrual cycle: Secondary | ICD-10-CM

## 2017-01-11 MED ORDER — COMFORT FIT MATERNITY SUPP LG MISC: 1.0000 [IU] | Freq: Every day | 0 refills | Status: DC

## 2017-01-11 NOTE — Progress Notes (Signed)
Having lots of pressure, [facility]  Contractions x 3 weeks 15-20 minutes apart 7/10

## 2017-01-11 NOTE — Progress Notes (Signed)
   PRENATAL VISIT NOTE  Subjective:  Debbie Mercer is a 21 y.o. G3P1011 at 7035w2d being seen today for ongoing prenatal care.  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  Patient reports no bleeding, no leaking, occasional contractions and Cxns 2-3 times per day, denies LOF/Bleeding, reports normal fetal movement, increased water intake discussed.  Contractions: Irregular. Vag. Bleeding: None.  Movement: Present. Denies leaking of fluid.   The following portions of the patient's history were reviewed and updated as appropriate: allergies, current medications, past family history, past medical history, past social history, past surgical history and problem list. Problem list updated.  Objective:   Vitals:   01/11/17 1603  BP: 116/69  Pulse: (!) 107  Weight: 216 lb (98 kg)    Fetal Status: Fetal Heart Rate (bpm): 146; doppler Fundal Height: 34 cm Movement: Present     General:  Alert, oriented and cooperative. Patient is in no acute distress.  Skin: Skin is warm and dry. No rash noted.   Cardiovascular: Normal heart rate noted  Respiratory: Normal respiratory effort, no problems with respiration noted  Abdomen: Soft, gravid, appropriate for gestational age.  Pain/Pressure: Present     Pelvic: Cervical exam deferred        Extremities: Normal range of motion.  Edema: Trace  Mental Status:  Normal mood and affect. Normal behavior. Normal judgment and thought content.   Assessment and Plan:  Pregnancy: G3P1011 at 435w2d  1. Supervision of other normal pregnancy, antepartum     Normal  late pregnancy discomforts.   2. Pelvic pressure in pregnancy, antepartum, third trimester      - Elastic Bandages & Supports (COMFORT FIT MATERNITY SUPP LG) MISC; 1 Units by Does not apply route daily.  Dispense: 1 each; Refill: 0  3. Vitamin D deficiency     Taking weekly vitamin D  Preterm labor symptoms and general obstetric precautions including but not limited to vaginal bleeding, contractions, leaking of fluid and fetal movement were reviewed in detail with the patient. Please refer to After Visit Summary for other counseling recommendations.  Return in about 1 week (around 01/18/2017) for ROB, GBS.   Roe Coombsachelle A Denney, CNM

## 2017-01-20 ENCOUNTER — Encounter: Payer: Self-pay | Admitting: Obstetrics

## 2017-01-24 NOTE — L&D Delivery Note (Signed)
Delivery Note At 12:23 PM a viable female was delivered via Vaginal, Spontaneous (Presentation: LOA).  APGAR: 7, 9; weight 8 lb 12.6 oz (3986 g).   Placenta status: intact, via Tomasa BlaseSchultz .  Cord: 3VC with no complications.  Cord pH: n/a  Anesthesia: Epidural Episiotomy: None Lacerations: 2nd degree;Vaginal Suture Repair: 2.0 vicryl Est. Blood Loss (mL): 100  Mom to postpartum.  Baby to Couplet care / Skin to Skin.  Debbie Mercer, CNM 02/23/17, 2:33 PM

## 2017-01-31 ENCOUNTER — Inpatient Hospital Stay (HOSPITAL_COMMUNITY)
Admission: AD | Admit: 2017-01-31 | Discharge: 2017-01-31 | Disposition: A | Discharge status: Home / Self Care | Payer: BLUE CROSS/BLUE SHIELD | Source: Ambulatory Visit | Attending: Obstetrics and Gynecology | Admitting: Obstetrics and Gynecology

## 2017-01-31 DIAGNOSIS — R0981 Nasal congestion: Secondary | ICD-10-CM | POA: Diagnosis not present

## 2017-01-31 DIAGNOSIS — R05 Cough: Secondary | ICD-10-CM | POA: Diagnosis not present

## 2017-01-31 DIAGNOSIS — R102 Pelvic and perineal pain: Secondary | ICD-10-CM | POA: Diagnosis present

## 2017-01-31 DIAGNOSIS — O9989 Other specified diseases and conditions complicating pregnancy, childbirth and the puerperium: Secondary | ICD-10-CM | POA: Diagnosis not present

## 2017-01-31 DIAGNOSIS — O26893 Other specified pregnancy related conditions, third trimester: Secondary | ICD-10-CM | POA: Diagnosis not present

## 2017-01-31 DIAGNOSIS — Z3A37 37 weeks gestation of pregnancy: Secondary | ICD-10-CM | POA: Insufficient documentation

## 2017-01-31 DIAGNOSIS — J029 Acute pharyngitis, unspecified: Secondary | ICD-10-CM | POA: Diagnosis present

## 2017-01-31 DIAGNOSIS — Z91013 Allergy to seafood: Secondary | ICD-10-CM | POA: Insufficient documentation

## 2017-01-31 DIAGNOSIS — O99513 Diseases of the respiratory system complicating pregnancy, third trimester: Secondary | ICD-10-CM | POA: Insufficient documentation

## 2017-01-31 DIAGNOSIS — Z79899 Other long term (current) drug therapy: Secondary | ICD-10-CM | POA: Diagnosis not present

## 2017-01-31 DIAGNOSIS — R058 Other specified cough: Secondary | ICD-10-CM | POA: Diagnosis present

## 2017-01-31 DIAGNOSIS — O479 False labor, unspecified: Secondary | ICD-10-CM | POA: Diagnosis not present

## 2017-01-31 DIAGNOSIS — O368130 Decreased fetal movements, third trimester, not applicable or unspecified: Secondary | ICD-10-CM | POA: Insufficient documentation

## 2017-01-31 LAB — URINALYSIS, ROUTINE W REFLEX MICROSCOPIC
BILIRUBIN URINE: NEGATIVE
Glucose, UA: NEGATIVE mg/dL
Hgb urine dipstick: NEGATIVE
Ketones, ur: NEGATIVE mg/dL
Nitrite: NEGATIVE
PH: 6 (ref 5.0–8.0)
Protein, ur: 30 mg/dL — AB
SPECIFIC GRAVITY, URINE: 1.016 (ref 1.005–1.030)

## 2017-01-31 LAB — OB RESULTS CONSOLE GBS: GBS: NEGATIVE

## 2017-01-31 MED ORDER — AZITHROMYCIN 250 MG PO TABS: ORAL_TABLET | ORAL | 0 refills | Status: DC

## 2017-01-31 MED ORDER — HYDROCOD POLST-CPM POLST ER 10-8 MG/5ML PO SUER: 5.0000 mL | Freq: Two times a day (BID) | ORAL | 0 refills | Status: DC

## 2017-01-31 NOTE — MAU Provider Note (Signed)
History    CSN: [id number]  Arrival date and time: 01/31/17 1324   None    Chief Complaint  Patient presents with  . Abdominal Pain  . Vaginal Pain  . Flu Symptoms   HPI   Patient is a [age] yr G3P1 female at 6537 weeks gestation that presents with constant pelvic pain that began 1 week ago. She describes this pain as a constant pressure and pain of her lower abdomen that worsens with movement and urination. She also reports cramping and rates her pain at a 4/10. She denies similar symptoms during her previous pregnancies, but reports that her pain is similar to when she was previously in labor. Patient also reports having frequent headaches at night that last until morning. Also reports possible off and on contractions that are painful and have been lasting for 5-10 minutes duration.   Patient also reports a decrease in fetal movement that began yesterday. She states that it has since resolved since being in the MAU and hooked up to the fetal heart rate monitor.   Patient also reports having persistent sinus congestion, sore throat, cough, runny nose, and sneezing for 2 days. She denies taking any OTC meds to alleviate her symptoms because she is unsure of what she can safely take during pregnancy. She states that her symptoms are worse at night. She denies any recent sick contacts.   OB History    Gravida Para Term Preterm AB Living   3 1 1   1 1    SAB TAB Ectopic Multiple Live Births   1       1       Past Medical History:  Diagnosis Date  . Medical history non-contributory     Past Surgical History:  Procedure Laterality Date  . BREAST LUMPECTOMY      Family History  Problem Relation Age of Onset  . Hypertension Mother   . Diabetes Maternal Grandmother   . Anemia Maternal Grandmother     Social History   Tobacco Use  . Smoking status: Never Smoker  . Smokeless tobacco: Never Used  Substance Use Topics  . Alcohol use: No  . Drug use: Yes    Types: Marijuana   Comment: occasional but not recently    Allergies:  Allergies  Allergen Reactions  . Shellfish Allergy Anaphylaxis and Itching    Medications Prior to Admission  Medication Sig Dispense Refill Last Dose  . Prenatal Vit-Fe Fumarate-FA (PRENATAL COMPLETE) 14-0.4 MG TABS Take 1 tablet by mouth daily. 60 each 0 01/30/2017 at Unknown time  . Elastic Bandages & Supports (COMFORT FIT MATERNITY SUPP LG) MISC 1 Units by Does not apply route daily. 1 each 0   . omeprazole (PRILOSEC) 20 MG capsule Take 1 capsule (20 mg total) 2 (two) times daily before a meal by mouth. (Patient not taking: Reported on 01/11/2017) 60 capsule 5 Not Taking  . Prenatal-DSS-FeCb-FeGl-FA (CITRANATAL BLOOM) 90-1 MG TABS Take 1 tablet daily by mouth. 30 tablet 12     Review of Systems  Constitutional: Positive for appetite change.  HENT: Positive for congestion, postnasal drip, rhinorrhea, sinus pressure, sneezing and sore throat.   Respiratory: Positive for cough.   Gastrointestinal: Positive for abdominal pain (lower).  Genitourinary: Positive for dysuria, frequency, pelvic pain and urgency.   Physical Exam   Blood pressure 110/63, pulse (!) 109, temperature 97.7 F (36.5 C), temperature source Oral, last menstrual period 05/16/2016, SpO2 100 %, unknown if currently breastfeeding.  Physical Exam  Nursing  note and vitals reviewed. Constitutional: She is oriented to person, place, and time. She appears well-developed and well-nourished. No distress.  HENT:  Head: Normocephalic and atraumatic.  Neck: Normal range of motion.  Cardiovascular: Regular rhythm and normal heart sounds.  Respiratory: Effort normal and breath sounds normal. No respiratory distress. She has no wheezes. She has no rales.  GI: Soft. There is no tenderness. There is no rebound and no guarding.  Genitourinary: Vagina normal.  Musculoskeletal: Normal range of motion.  Neurological: She is alert and oriented to person, place, and time.  Skin:  Skin is warm and dry.  Psychiatric: She has a normal mood and affect. Her behavior is normal. Judgment and thought content normal.  GU: Cervix is closed   MAU Course  Procedures MDM  GBS done due to patient's missed 36 week appointment with OB provider.   Due to patient's persistent sinus congestion and worsening of productive cough, and close proximity to delivery, will treat with antibiotics. Will also treat with cough suppressant.   Ruled out contractions and active labor due to closed cervix.   Assessment and Plan  Patient is a 22 y.o. G3P1 female at [redacted] weeks gestation with pelvic pain/pressure with cramping X 1 week, and URI symptoms X 2 days.   A.  Nasal congestion Sore throat Productive cough Braxton Hick's contraction [redacted] weeks gestation of pregnancy   P.  Patient will be discharged home.   Azithromycin 250 mg, take two tabs po day one, then 1 tab po day 2-5.  Tussionex 10-8 mg/32mL take PO 2 times daily prn cough   Continue to follow up as scheudled with OB provider weekly until delivery.   Discussed and given information on safe OTC medications during pregnancy.   Return precautions: I discussed with patient that she should return to MAU if she is having painful contractions 6 times per hour, or if there is any bleeding or discharge.   Ilsa Iha PA-S2 01/31/2017, 3:32 PM   I confirm that I have verified the information documented in the physician assistant student's note and that I have also personally reperformed the physical exam and all medical decision making activities.  Raelyn Mora, CNM  01/31/2017 6:00 PM

## 2017-01-31 NOTE — MAU Provider Note (Signed)
History     CSN: [id number]  Arrival date and time: 01/31/17 1324  Provider's initial contact with patient at 1515    Chief Complaint  Patient presents with  . Abdominal Pain  . Vaginal Pain  . Flu Symptoms   HPI  Ms.  Nikeshia R Fallin is a 22 y.o. year old 373P1011 female at 4749w1d weeks gestation who presents to MAU reporting many vague complaints: pelvic pressure, pelvic pain, productive cough with yellow-green sputum, nasal congestion, sore throat, and decreased fetal movement. [facility], but has not been seen since 01/11/2017. She has not gone to any more appts, "because they don't do anything when I'm there, but listen to the baby's heartbeat". She denies VB or LOF.  Past Medical History:  Diagnosis Date  . Medical history non-contributory     Past Surgical History:  Procedure Laterality Date  . BREAST LUMPECTOMY      Family History  Problem Relation Age of Onset  . Hypertension Mother   . Diabetes Maternal Grandmother   . Anemia Maternal Grandmother     Social History   Tobacco Use  . Smoking status: Never Smoker  . Smokeless tobacco: Never Used  Substance Use Topics  . Alcohol use: No  . Drug use: Yes    Types: Marijuana    Comment: occasional but not recently    Allergies:  Allergies  Allergen Reactions  . Shellfish Allergy Anaphylaxis and Itching    Medications Prior to Admission  Medication Sig Dispense Refill Last Dose  . Prenatal Vit-Fe Fumarate-FA (PRENATAL COMPLETE) 14-0.4 MG TABS Take 1 tablet by mouth daily. 60 each 0 01/30/2017 at Unknown time  . Elastic Bandages & Supports (COMFORT FIT MATERNITY SUPP LG) MISC 1 Units by Does not apply route daily. 1 each 0   . omeprazole (PRILOSEC) 20 MG capsule Take 1 capsule (20 mg total) 2 (two) times daily before a meal by mouth. (Patient not taking: Reported on 01/11/2017) 60 capsule 5 Not Taking  . Prenatal-DSS-FeCb-FeGl-FA (CITRANATAL BLOOM) 90-1 MG TABS Take 1 tablet daily by mouth. 30  tablet 12     Review of Systems  Constitutional: Negative.   HENT: Positive for sore throat.   Eyes: Negative.   Respiratory: Positive for cough.   Cardiovascular: Negative.   Gastrointestinal: Positive for abdominal pain.  Endocrine: Negative.   Genitourinary: Positive for pelvic pain (and pressure).  Musculoskeletal: Negative.   Skin: Negative.   Allergic/Immunologic: Negative.   Neurological: Negative.   Hematological: Negative.   Psychiatric/Behavioral: Negative.    Physical Exam   Blood pressure 110/63, pulse (!) 109, temperature 97.7 F (36.5 C), temperature source Oral, last menstrual period 05/16/2016, SpO2 100 %, unknown if currently breastfeeding.  Physical Exam  Nursing note and vitals reviewed. Constitutional: She is oriented to person, place, and time. She appears well-developed and well-nourished.  HENT:  Head: Normocephalic.  Eyes: Pupils are equal, round, and reactive to light.  Neck: Normal range of motion.  Cardiovascular: Normal rate, regular rhythm and normal heart sounds.  Respiratory: Effort normal and breath sounds normal.  GI: Soft. Bowel sounds are normal.  Musculoskeletal: Normal range of motion.  Neurological: She is alert and oriented to person, place, and time. She has normal reflexes.  Skin: Skin is warm and dry.    MAU Course  Procedures  MDM CCUA UCx GBS VE NST - FHR: 135 bpm / moderate variability / accels present / decels absent / TOCO: regular every 3-7 mins   Results  for orders placed or performed during the hospital encounter of 01/31/17 (from the past 24 hour(s))  Urinalysis, Routine w reflex microscopic     Status: Abnormal   Collection Time: 01/31/17  1:38 PM  Result Value Ref Range   Color, Urine YELLOW YELLOW   APPearance CLOUDY (A) CLEAR   Specific Gravity, Urine 1.016 1.005 - 1.030   pH 6.0 5.0 - 8.0   Glucose, UA NEGATIVE NEGATIVE mg/dL   Hgb urine dipstick NEGATIVE NEGATIVE   Bilirubin Urine NEGATIVE NEGATIVE    Ketones, ur NEGATIVE NEGATIVE mg/dL   Protein, ur 30 (A) NEGATIVE mg/dL   Nitrite NEGATIVE NEGATIVE   Leukocytes, UA TRACE (A) NEGATIVE   RBC / HPF 0-5 0 - 5 RBC/hpf   WBC, UA 6-30 0 - 5 WBC/hpf   Bacteria, UA MANY (A) NONE SEEN   Squamous Epithelial / LPF TOO NUMEROUS TO COUNT (A) NONE SEEN   Mucus PRESENT      Assessment and Plan  Nasal congestion - Safe medication list given  Sore throat - Safe medication list given  Productive cough - Rx for Z-pak sent - Rx for Tussionex BID - Information provided on cough   Braxton Hick's contraction - Information provided on false labor  Discharge home Patient verbalized an understanding of the plan of care and agrees.    Raelyn Mora, MSN, CNM 01/31/2017, 3:30 PM

## 2017-01-31 NOTE — Discharge Instructions (Signed)

## 2017-01-31 NOTE — MAU Note (Signed)
Pt is a G3P1 at 37.1 weeks with multiple complaints.  Most concerning to the pt is the constant pelvic pain and pressure for the last week, which is why she came in today.  Pt also reports irregular cramping and head cold symptoms that are both worse at night.  Pt reports decreased fetal movement day.

## 2017-02-01 LAB — CULTURE, OB URINE: SPECIAL REQUESTS: NORMAL

## 2017-02-02 LAB — CULTURE, BETA STREP (GROUP B ONLY)

## 2017-02-07 ENCOUNTER — Ambulatory Visit (INDEPENDENT_AMBULATORY_CARE_PROVIDER_SITE_OTHER): Payer: BLUE CROSS/BLUE SHIELD | Admitting: Certified Nurse Midwife

## 2017-02-07 DIAGNOSIS — Z3480 Encounter for supervision of other normal pregnancy, unspecified trimester: Secondary | ICD-10-CM

## 2017-02-07 DIAGNOSIS — Z3483 Encounter for supervision of other normal pregnancy, third trimester: Secondary | ICD-10-CM

## 2017-02-07 NOTE — Progress Notes (Signed)
   PRENATAL VISIT NOTE  Subjective:  Debbie Mercer is a [age] y.o. G3P1011 at 2816w1d being seen today for ongoing prenatal care.  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  Patient reports no complaints.   .  .   . Denies leaking of fluid.   The following portions of the patient's history were reviewed and updated as appropriate: allergies, current medications, past family history, past medical history, past social history, past surgical history and problem list. Problem list updated.  Objective:  There were no vitals filed for this visit.  Fetal Status: Fetal Heart Rate (bpm): 147; doppler Fundal Height: 39 cm       General:  Alert, oriented and cooperative. Patient is in no acute distress.  Skin: Skin is warm and dry. No rash noted.   Cardiovascular: Normal heart rate noted  Respiratory: Normal respiratory effort, no problems with respiration noted  Abdomen: Soft, gravid, appropriate for gestational age.        Pelvic: Cervical exam deferred        Extremities: Normal range of motion.     Mental Status:  Normal mood and affect. Normal behavior. Normal judgment and thought content.   Assessment and Plan:  Pregnancy: G3P1011 at 3816w1d  1. Supervision of other normal pregnancy, antepartum     Doing well.   Declines cervical exam.   Desires to stop working this week and start on maternity leave.    Term labor symptoms and general obstetric precautions including but not limited to vaginal bleeding, contractions,  leaking of fluid and fetal movement were reviewed in detail with the patient. Please refer to After Visit Summary for other counseling recommendations.  Return in about 1 week (around 02/14/2017) for ROB.   Roe Coombsachelle A Yuepheng Schaller, CNM

## 2017-02-14 ENCOUNTER — Ambulatory Visit (INDEPENDENT_AMBULATORY_CARE_PROVIDER_SITE_OTHER): Payer: BLUE CROSS/BLUE SHIELD | Admitting: Certified Nurse Midwife

## 2017-02-14 ENCOUNTER — Other Ambulatory Visit: Payer: Self-pay | Admitting: Certified Nurse Midwife

## 2017-02-14 ENCOUNTER — Encounter: Payer: Self-pay | Admitting: Certified Nurse Midwife

## 2017-02-14 ENCOUNTER — Ambulatory Visit (HOSPITAL_COMMUNITY)
Admission: RE | Admit: 2017-02-14 | Discharge: 2017-02-14 | Disposition: A | Discharge status: Home / Self Care | Payer: BLUE CROSS/BLUE SHIELD | Source: Ambulatory Visit | Attending: Certified Nurse Midwife | Admitting: Certified Nurse Midwife

## 2017-02-14 VITALS — BP 110/72 | HR 110 | Wt 223.0 lb

## 2017-02-14 DIAGNOSIS — O368130 Decreased fetal movements, third trimester, not applicable or unspecified: Secondary | ICD-10-CM | POA: Diagnosis not present

## 2017-02-14 DIAGNOSIS — Z3A39 39 weeks gestation of pregnancy: Secondary | ICD-10-CM

## 2017-02-14 DIAGNOSIS — O288 Other abnormal findings on antenatal screening of mother: Secondary | ICD-10-CM | POA: Diagnosis present

## 2017-02-14 DIAGNOSIS — Z3483 Encounter for supervision of other normal pregnancy, third trimester: Secondary | ICD-10-CM

## 2017-02-14 DIAGNOSIS — E559 Vitamin D deficiency, unspecified: Secondary | ICD-10-CM

## 2017-02-14 DIAGNOSIS — Z3480 Encounter for supervision of other normal pregnancy, unspecified trimester: Secondary | ICD-10-CM

## 2017-02-14 NOTE — Patient Instructions (Signed)
Braxton Hicks Contractions Contractions of the uterus can occur throughout pregnancy, but they are not always a sign that you are in labor. You may have practice contractions called Braxton Hicks contractions. These false labor contractions are sometimes confused with true labor. What are Braxton Hicks contractions? Braxton Hicks contractions are tightening movements that occur in the muscles of the uterus before labor. Unlike true labor contractions, these contractions do not result in opening (dilation) and thinning of the cervix. Toward the end of pregnancy (32-34 weeks), Braxton Hicks contractions can happen more often and may become stronger. These contractions are sometimes difficult to tell apart from true labor because they can be very uncomfortable. You should not feel embarrassed if you go to the hospital with false labor. Sometimes, the only way to tell if you are in true labor is for your health care provider to look for changes in the cervix. The health care provider will do a physical exam and may monitor your contractions. If you are not in true labor, the exam should show that your cervix is not dilating and your water has not broken. If there are other health problems associated with your pregnancy, it is completely safe for you to be sent home with false labor. You may continue to have Braxton Hicks contractions until you go into true labor. How to tell the difference between true labor and false labor True labor  Contractions last 30-70 seconds.  Contractions become very regular.  Discomfort is usually felt in the top of the uterus, and it spreads to the lower abdomen and low back.  Contractions do not go away with walking.  Contractions usually become more intense and increase in frequency.  The cervix dilates and gets thinner. False labor  Contractions are usually shorter and not as strong as true labor contractions.  Contractions are usually irregular.  Contractions  are often felt in the front of the lower abdomen and in the groin.  Contractions may go away when you walk around or change positions while lying down.  Contractions get weaker and are shorter-lasting as time goes on.  The cervix usually does not dilate or become thin. Follow these instructions at home:  Take over-the-counter and prescription medicines only as told by your health care provider.  Keep up with your usual exercises and follow other instructions from your health care provider.  Eat and drink lightly if you think you are going into labor.  If Braxton Hicks contractions are making you uncomfortable: ? Change your position from lying down or resting to walking, or change from walking to resting. ? Sit and rest in a tub of warm water. ? Drink enough fluid to keep your urine pale yellow. Dehydration may cause these contractions. ? Do slow and deep breathing several times an hour.  Keep all follow-up prenatal visits as told by your health care provider. This is important. Contact a health care provider if:  You have a fever.  You have continuous pain in your abdomen. Get help right away if:  Your contractions become stronger, more regular, and closer together.  You have fluid leaking or gushing from your vagina.  You pass blood-tinged mucus (bloody show).  You have bleeding from your vagina.  You have low back pain that you never had before.  You feel your baby's head pushing down and causing pelvic pressure.  Your baby is not moving inside you as much as it used to. Summary  Contractions that occur before labor are called Braxton   Hicks contractions, false labor, or practice contractions.  Braxton Hicks contractions are usually shorter, weaker, farther apart, and less regular than true labor contractions. True labor contractions usually become progressively stronger and regular and they become more frequent.  Manage discomfort from [facility] contractions by  changing position, resting in a warm bath, drinking plenty of water, or practicing deep breathing. This information is not intended to replace advice given to you by your health care provider. Make sure you discuss any questions you have with your health care provider. Document Released: 05/26/2016 Document Revised: 05/26/2016 Document Reviewed: 05/26/2016 Elsevier Interactive Patient Education  2018 [employer].  Before Baby Comes Home Before your baby arrives it is important to:  Have all of the supplies that you will need to care for your baby.  Know where to go if there is an emergency.  Discuss the baby's arrival with other family members.  What supplies will I need?  It is recommended that you have the following supplies: Large Items  Crib.  Crib mattress.  Rear-facing infant car seat. If possible, have a trained professional check to make sure that it is installed correctly.  Feeding  6-8 bottles that are 4-5 oz in size.  6-8 nipples.  Bottle brush.  Sterilizer, or a large pan or kettle with a lid.  A way to boil and cool water.  If you will be breastfeeding: ? Breast pump. ? Nipple cream. ? Nursing bra. ? Breast pads. ? Breast shields.  If you will be formula feeding: ? Formula. ? Measuring cups. ? Measuring spoons.  Bathing  Mild baby soap and baby shampoo.  Petroleum jelly.  Soft cloth towel and washcloth.  Hooded towel.  Cotton balls.  Bath basin.  Other Supplies  Rectal thermometer.  Bulb syringe.  Baby wipes or washcloths for diaper changes.  Diaper bag.  Changing pad.  Clothing, including one-piece outfits and pajamas.  Baby nail clippers.  Receiving blankets.  Mattress pad and sheets for the crib.  Night-light for the baby's room.  Baby monitor.  2 or 3 pacifiers.  Either 24-36 cloth diapers and waterproof diaper covers or a box of disposable diapers. You may need to use as many as 10-12 diapers per day.  How  do I prepare for an emergency? Prepare for an emergency by:  Knowing how to get to the nearest hospital.  Listing the phone numbers of your baby's health care providers near your home phone and in your cell phone.  How do I prepare my family?  Decide how to handle visitors.  If you have other children: ? Talk with them about the baby coming home. Ask them how they feel about it. ? Read a book together about being a new big brother or sister. ? Find ways to let them help you prepare for the new baby. ? Have someone ready to care for them while you are in the hospital. This information is not intended to replace advice given to you by your health care provider. Make sure you discuss any questions you have with your health care provider. Document Released: 12/24/2007 Document Revised: 06/18/2015 Document Reviewed: 12/18/2013 Elsevier Interactive Patient Education  2018 [employer] Inc[employer]  Pain Relief During Labor and Delivery Many things can cause pain during labor and delivery, including:  Pressure on bones and ligaments due to the baby moving through the pelvis.  Stretching of tissues due to the baby moving through the birth canal.  Muscle tension due to anxiety or nervousness.  The uterus tightening (contracting) and relaxing to help move the baby.  There are many ways to deal with the pain of labor and delivery. They include:  Taking prenatal classes. Taking these classes helps you know what to expect during your baby's birth. What you learn will increase your confidence and decrease your anxiety.  Practicing relaxation techniques or doing relaxing activities, such as: ? Focused breathing. ? Meditation. ? Visualization. ? Aroma therapy. ? Listening to your favorite music. ? Hypnosis.  Taking a warm shower or bath (hydrotherapy). This may: ? Provide comfort and relaxation. ? Lessen your perception of pain. ? Decrease the amount of pain medicine needed. ? Decrease the length  of labor.  Getting a massage or counterpressure on your back.  Applying warm packs or ice packs.  Changing positions often, moving around, or using a birthing ball.  Getting: ? Pain medicine through an IV or injection into a muscle. ? Pain medicine inserted into your spinal column. ? Injections of sterile water just under the skin on your lower back (intradermal injections). ? Laughing gas (nitrous oxide).  Discuss your pain control options with your health care provider during your prenatal visits. Explore the options offered by your hospital or birth center. What kinds of medicine are available? There are two kinds of medicines that can be used to relieve pain during labor and delivery:  Analgesics. These medicines decrease pain without causing you to lose feeling or the ability to move your muscles.  Anesthetics. These medicines block feeling in the body and can decrease your ability to move freely.  Both of these kinds of medicine can cause minor side effects, such as nausea, trouble concentrating, and sleepiness. They can also decrease the baby's heart rate before birth and affect the baby's breathing rate after birth. For this reason, health care providers are careful about when and how much medicine is given. What are specific medicines and procedures that provide pain relief? Local Anesthetics Local anesthetics are used to numb a small area of the body. They may be used along with another kind of anesthetic or used to numb the nerves of the vagina, cervix, and perineum during the second stage of labor. General Anesthetics General anesthetics cause you to lose consciousness so you do not feel pain. They are usually only used for an emergency cesarean delivery. General anesthetics are given through an IV tube and a mask. Pudendal Block A pudendal block is a form of local anesthetic. It may be used to relieve the pain associated with pushing or stretching of the perineum at the time  of delivery or to further numb the perineum. A pudendal block is done by injecting numbing medicine through the vaginal wall into a nerve in the pelvis. Epidural Analgesia Epidural analgesia is given through a flexible IV catheter that is inserted into the lower back. Numbing medicine is delivered continuously to the area near your spinal column nerves (epidural space). After having this type of analgesia, you may be able to move your legs but you most likely will not be able to walk. Depending on the amount of medicine given, you may lose all feeling in the lower half of your body, or you may retain some level of sensation, including the urge to push. Epidural analgesia can be used to provide pain relief for a vaginal birth. Spinal Block A spinal block is similar to epidural analgesia, but the medicine is injected into the spinal fluid instead of the epidural space. A spinal block is  only given once. It starts to relieve pain quickly, but the pain relief lasts only 1-6 hours. Spinal blocks can be used for cesarean deliveries. Combined Spinal-Epidural (CSE) Block A CSE block combines the effects of a spinal block and epidural analgesia. The spinal block works quickly to block all pain. The epidural analgesia provides continuous pain relief, even after the effects of the spinal block have worn off. This information is not intended to replace advice given to you by your health care provider. Make sure you discuss any questions you have with your health care provider. Document Released: 04/28/2008 Document Revised: 06/19/2015 Document Reviewed: 06/03/2015 Elsevier Interactive Patient Education  2018 [employer].  Third Trimester of Pregnancy The third trimester is from week 29 through week 42, months 7 through 9. This trimester is when your unborn baby (fetus) is growing very fast. At the end of the ninth month, the unborn baby is about 20 inches in length. It weighs about 6-10 pounds. Follow these  instructions at home:  Avoid all smoking, herbs, and alcohol. Avoid drugs not approved by your doctor.  Do not use any tobacco products, including cigarettes, chewing tobacco, and electronic cigarettes. If you need help quitting, ask your doctor. You may get counseling or other support to help you quit.  Only take medicine as told by your doctor. Some medicines are safe and some are not during pregnancy.  Exercise only as told by your doctor. Stop exercising if you start having cramps.  Eat regular, healthy meals.  Wear a good support bra if your breasts are tender.  Do not use hot tubs, steam rooms, or saunas.  Wear your seat belt when driving.  Avoid raw meat, uncooked cheese, and liter boxes and soil used by cats.  Take your prenatal vitamins.  Take 1500-2000 milligrams of calcium daily starting at the 20th week of pregnancy until you deliver your baby.  Try taking medicine that helps you poop (stool softener) as needed, and if your doctor approves. Eat more fiber by eating fresh fruit, vegetables, and whole grains. Drink enough fluids to keep your pee (urine) clear or pale yellow.  Take warm water baths (sitz baths) to soothe pain or discomfort caused by hemorrhoids. Use hemorrhoid cream if your doctor approves.  If you have puffy, bulging veins (varicose veins), wear support hose. Raise (elevate) your feet for 15 minutes, 3-4 times a day. Limit salt in your diet.  Avoid heavy lifting, wear low heels, and sit up straight.  Rest with your legs raised if you have leg cramps or low back pain.  Visit your dentist if you have not gone during your pregnancy. Use a soft toothbrush to brush your teeth. Be gentle when you floss.  You can have sex (intercourse) unless your doctor tells you not to.  Do not travel far distances unless you must. Only do so with your doctor's approval.  Take prenatal classes.  Practice driving to the hospital.  Pack your hospital bag.  Prepare the  baby's room.  Go to your doctor visits. Get help if:  You are not sure if you are in labor or if your water has broken.  You are dizzy.  You have mild cramps or pressure in your lower belly (abdominal).  You have a nagging pain in your belly area.  You continue to feel sick to your stomach (nauseous), throw up (vomit), or have watery poop (diarrhea).  You have bad smelling fluid coming from your vagina.  You have pain with  peeing (urination). Get help right away if:  You have a fever.  You are leaking fluid from your vagina.  You are spotting or bleeding from your vagina.  You have severe belly cramping or pain.  You lose or gain weight rapidly.  You have trouble catching your breath and have chest pain.  You notice sudden or extreme puffiness (swelling) of your face, hands, ankles, feet, or legs.  You have not felt the baby move in over an hour.  You have severe headaches that do not go away with medicine.  You have vision changes. This information is not intended to replace advice given to you by your health care provider. Make sure you discuss any questions you have with your health care provider. Document Released: 04/06/2009 Document Revised: 06/18/2015 Document Reviewed: 03/13/2012 Elsevier Interactive Patient Education  2017 [employer]

## 2017-02-14 NOTE — Progress Notes (Signed)
   PRENATAL VISIT NOTE  Subjective:  Debbie Mercer is a 22 y.o. G3P1011 at 3638w1d being seen today for ongoing prenatal care.  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  Patient reports no bleeding, no leaking and occasional contractions.  Contractions: Irregular. Vag. Bleeding: None.  Movement: (!) Decreased. Denies leaking of fluid.   The following portions of the patient's history were reviewed and updated as appropriate: allergies, current medications, past family history, past medical history, past social history, past surgical history and problem list. Problem list updated.  Objective:   Vitals:   02/14/17 1108  BP: 110/72  Pulse: (!) 110  Weight: 223 lb (101.2 kg)    Fetal Status: Fetal Heart Rate (bpm): 151; doppler NST:  Fundal Height: 39 cm Movement: (!) Decreased  Presentation: Vertex  General:  Alert, oriented and cooperative. Patient is in no acute distress.  Skin: Skin is warm and dry. No rash noted.   Cardiovascular: Normal heart rate noted  Respiratory: Normal respiratory effort, no problems with respiration noted  Abdomen: Soft, gravid, appropriate for gestational age.  Pain/Pressure: Present     Pelvic: Cervical exam performed Dilation: Closed Effacement (%): 0 Station: -3  Extremities: Normal range of motion.  Edema: Mild pitting, slight indentation  Mental Status:  Normal mood and affect. Normal behavior. Normal judgment and thought content.   Assessment and Plan:  Pregnancy: G3P1011  at 1538w1d  1. Supervision of other normal pregnancy, antepartum     Non-reactive NST: BPP ordered.    2. Vitamin D deficiency      Taking weekly vitamin D.   Term labor symptoms and general obstetric precautions including but not limited to vaginal bleeding, contractions, leaking of fluid and fetal movement were reviewed in detail with the patient. Please refer to After Visit Summary for other counseling recommendations.  Return in about 1 week (around 02/21/2017) for ROB, NST.   Roe Coombsachelle A Alee Katen, CNM

## 2017-02-14 NOTE — Addendum Note (Signed)
Addended by: Tim LairLARK, Quinita Kostelecky on: 02/14/2017 01:58 PM   Modules accepted: Orders

## 2017-02-14 NOTE — Progress Notes (Signed)
ROB w/complaint of decreased FM  (NST currently being used by another provider)  Increased pain and pressure irregular contractions.  desires cervix check.requested to have membranes swept.

## 2017-02-21 ENCOUNTER — Inpatient Hospital Stay (HOSPITAL_COMMUNITY)
Admission: AD | Admit: 2017-02-21 | Discharge: 2017-02-25 | DRG: 805 | Disposition: A | Discharge status: Home / Self Care | Payer: BLUE CROSS/BLUE SHIELD | Source: Ambulatory Visit | Attending: Obstetrics & Gynecology | Admitting: Obstetrics & Gynecology

## 2017-02-21 ENCOUNTER — Encounter: Payer: Self-pay | Admitting: Certified Nurse Midwife

## 2017-02-21 ENCOUNTER — Ambulatory Visit (INDEPENDENT_AMBULATORY_CARE_PROVIDER_SITE_OTHER): Payer: BLUE CROSS/BLUE SHIELD | Admitting: Certified Nurse Midwife

## 2017-02-21 ENCOUNTER — Encounter (HOSPITAL_COMMUNITY): Payer: Self-pay | Admitting: *Deleted

## 2017-02-21 ENCOUNTER — Encounter: Payer: Self-pay | Admitting: Obstetrics and Gynecology

## 2017-02-21 ENCOUNTER — Telehealth (HOSPITAL_COMMUNITY): Payer: Self-pay | Admitting: *Deleted

## 2017-02-21 ENCOUNTER — Ambulatory Visit (HOSPITAL_COMMUNITY)
Admission: RE | Admit: 2017-02-21 | Discharge: 2017-02-21 | Disposition: A | Discharge status: Home / Self Care | Payer: BLUE CROSS/BLUE SHIELD | Source: Ambulatory Visit | Attending: Certified Nurse Midwife | Admitting: Certified Nurse Midwife

## 2017-02-21 VITALS — BP 124/85 | HR 109 | Wt 224.0 lb

## 2017-02-21 DIAGNOSIS — K219 Gastro-esophageal reflux disease without esophagitis: Secondary | ICD-10-CM | POA: Diagnosis present

## 2017-02-21 DIAGNOSIS — Z3480 Encounter for supervision of other normal pregnancy, unspecified trimester: Secondary | ICD-10-CM

## 2017-02-21 DIAGNOSIS — O99324 Drug use complicating childbirth: Secondary | ICD-10-CM | POA: Diagnosis present

## 2017-02-21 DIAGNOSIS — Z3483 Encounter for supervision of other normal pregnancy, third trimester: Secondary | ICD-10-CM

## 2017-02-21 DIAGNOSIS — O368330 Maternal care for abnormalities of the fetal heart rate or rhythm, third trimester, not applicable or unspecified: Secondary | ICD-10-CM

## 2017-02-21 DIAGNOSIS — O0930 Supervision of pregnancy with insufficient antenatal care, unspecified trimester: Secondary | ICD-10-CM

## 2017-02-21 DIAGNOSIS — J029 Acute pharyngitis, unspecified: Secondary | ICD-10-CM

## 2017-02-21 DIAGNOSIS — F1210 Cannabis abuse, uncomplicated: Secondary | ICD-10-CM | POA: Diagnosis present

## 2017-02-21 DIAGNOSIS — O411230 Chorioamnionitis, third trimester, not applicable or unspecified: Secondary | ICD-10-CM | POA: Diagnosis present

## 2017-02-21 DIAGNOSIS — O368390 Maternal care for abnormalities of the fetal heart rate or rhythm, unspecified trimester, not applicable or unspecified: Secondary | ICD-10-CM

## 2017-02-21 DIAGNOSIS — O289 Unspecified abnormal findings on antenatal screening of mother: Secondary | ICD-10-CM | POA: Diagnosis present

## 2017-02-21 DIAGNOSIS — O9962 Diseases of the digestive system complicating childbirth: Secondary | ICD-10-CM | POA: Diagnosis present

## 2017-02-21 DIAGNOSIS — Z3A40 40 weeks gestation of pregnancy: Secondary | ICD-10-CM

## 2017-02-21 DIAGNOSIS — O288 Other abnormal findings on antenatal screening of mother: Secondary | ICD-10-CM | POA: Diagnosis present

## 2017-02-21 DIAGNOSIS — R05 Cough: Secondary | ICD-10-CM

## 2017-02-21 DIAGNOSIS — R0981 Nasal congestion: Secondary | ICD-10-CM

## 2017-02-21 DIAGNOSIS — O479 False labor, unspecified: Secondary | ICD-10-CM

## 2017-02-21 DIAGNOSIS — R058 Other specified cough: Secondary | ICD-10-CM

## 2017-02-21 LAB — CBC
HCT: 37 % (ref 36.0–46.0)
HEMOGLOBIN: 11.7 g/dL — AB (ref 12.0–15.0)
MCH: 23.7 pg — ABNORMAL LOW (ref 26.0–34.0)
MCHC: 31.6 g/dL (ref 30.0–36.0)
MCV: 74.9 fL — ABNORMAL LOW (ref 78.0–100.0)
Platelets: 294 10*3/uL (ref 150–400)
RBC: 4.94 MIL/uL (ref 3.87–5.11)
RDW: 15 % (ref 11.5–15.5)
WBC: 11.1 10*3/uL — AB (ref 4.0–10.5)

## 2017-02-21 LAB — ABO/RH: ABO/RH(D): A POS

## 2017-02-21 LAB — TYPE AND SCREEN
ABO/RH(D): A POS
ANTIBODY SCREEN: NEGATIVE

## 2017-02-21 MED ORDER — OXYCODONE-ACETAMINOPHEN 5-325 MG PO TABS
1.0000 | ORAL_TABLET | ORAL | Status: DC | PRN
  Administered 2017-02-23: 1 via ORAL
  Filled 2017-02-21: qty 1

## 2017-02-21 MED ORDER — OXYTOCIN 40 UNITS IN LACTATED RINGERS INFUSION - SIMPLE MED
2.5000 [IU]/h | INTRAVENOUS | Status: DC
  Filled 2017-02-21 (×2): qty 1000

## 2017-02-21 MED ORDER — MISOPROSTOL 50MCG HALF TABLET
50.0000 ug | ORAL_TABLET | ORAL | Status: DC | PRN
  Administered 2017-02-21 – 2017-02-22 (×4): 50 ug via BUCCAL
  Filled 2017-02-21 (×5): qty 1

## 2017-02-21 MED ORDER — LACTATED RINGERS IV SOLN
INTRAVENOUS | Status: DC
  Administered 2017-02-21 – 2017-02-23 (×5): via INTRAVENOUS

## 2017-02-21 MED ORDER — LIDOCAINE HCL (PF) 1 % IJ SOLN
30.0000 mL | INTRAMUSCULAR | Status: AC | PRN
  Administered 2017-02-23: 30 mL via SUBCUTANEOUS
  Filled 2017-02-21: qty 30

## 2017-02-21 MED ORDER — SOD CITRATE-CITRIC ACID 500-334 MG/5ML PO SOLN: 30.0000 mL | ORAL | Status: DC | PRN

## 2017-02-21 MED ORDER — ONDANSETRON HCL 4 MG/2ML IJ SOLN
4.0000 mg | Freq: Four times a day (QID) | INTRAMUSCULAR | Status: DC | PRN
  Administered 2017-02-22: 4 mg via INTRAVENOUS
  Filled 2017-02-21: qty 2

## 2017-02-21 MED ORDER — TERBUTALINE SULFATE 1 MG/ML IJ SOLN
0.2500 mg | Freq: Once | INTRAMUSCULAR | Status: DC | PRN
  Filled 2017-02-21: qty 1

## 2017-02-21 MED ORDER — LACTATED RINGERS IV SOLN: 500.0000 mL | INTRAVENOUS | Status: DC | PRN

## 2017-02-21 MED ORDER — OXYTOCIN BOLUS FROM INFUSION
500.0000 mL | Freq: Once | INTRAVENOUS | Status: AC
  Administered 2017-02-23: 500 mL via INTRAVENOUS

## 2017-02-21 MED ORDER — OXYCODONE-ACETAMINOPHEN 5-325 MG PO TABS: 2.0000 | ORAL_TABLET | ORAL | Status: DC | PRN

## 2017-02-21 MED ORDER — FENTANYL CITRATE (PF) 100 MCG/2ML IJ SOLN
100.0000 ug | INTRAMUSCULAR | Status: DC | PRN
  Administered 2017-02-22: 100 ug via INTRAVENOUS
  Filled 2017-02-21: qty 2

## 2017-02-21 MED ORDER — ACETAMINOPHEN 325 MG PO TABS
650.0000 mg | ORAL_TABLET | ORAL | Status: DC | PRN
  Administered 2017-02-23: 650 mg via ORAL
  Filled 2017-02-21 (×2): qty 2

## 2017-02-21 NOTE — H&P (Signed)
LABOR AND DELIVERY ADMISSION HISTORY AND PHYSICAL NOTE  Debbie Mercer is a 22 y.o. female [phone] with IUP at [redacted]w[redacted]d by LMP presenting for IOL due to NST showing variable decelerations in office with BPP of 6/8 today in office.  She reports positive fetal movement. She denies leakage of fluid or vaginal bleeding.  Prenatal History/Complications: PNC at [facility] Pregnancy complications:  - vitamin D deficiency   Sono: @[redacted]w[redacted]d , CWD, normal female anatomy, variable presentation, placenta posterior above cervical os, 315g, 46% EFW  Past Medical History: Past Medical History:  Diagnosis Date  . Medical history non-contributory     Past Surgical History: Past Surgical History:  Procedure Laterality Date  . BREAST LUMPECTOMY      Obstetrical History: OB History    Gravida Para Term Preterm AB Living   3 1 1   1 1    SAB TAB Ectopic Multiple Live Births   1       1      Social History: Social History   Socioeconomic History  . Marital status: Single    Spouse name: None  . Number of children: None  . Years of education: None  . Highest education level: None  Social Needs  . Financial resource strain: None  . Food insecurity - worry: None  . Food insecurity - inability: None  . Transportation needs - medical: None  . Transportation needs - non-medical: None  Occupational History  . None  Tobacco Use  . Smoking status: Never Smoker  . Smokeless tobacco: Never Used  Substance and Sexual Activity  . Alcohol use: No  . Drug use: Yes    Types: Marijuana    Comment: occasional but not recently  . Sexual activity: Yes    Birth control/protection: None  Other Topics Concern  . None  Social History Narrative  . None    Family History: Family History  Problem Relation Age of Onset  . Hypertension Mother   . Diabetes Maternal Grandmother   . Anemia Maternal Grandmother     Allergies: Allergies  Allergen Reactions  . Shellfish Allergy Anaphylaxis and Itching     Medications Prior to Admission  Medication Sig Dispense Refill Last Dose  . azithromycin (ZITHROMAX) 250 MG tablet 2 tabs at one time on day 1, then 1 tab daily for days 2-5 (Patient not taking: Reported on 02/14/2017) 6 each 0 Not Taking  . chlorpheniramine-HYDROcodone (TUSSIONEX PENNKINETIC ER) 10-8 MG/5ML SUER Take 5 mLs by mouth 2 (two) times daily. (Patient not taking: Reported on 02/14/2017) 140 mL 0 Not Taking  . Elastic Bandages & Supports (COMFORT FIT MATERNITY SUPP LG) MISC 1 Units by Does not apply route daily. 1 each 0 Taking  . omeprazole (PRILOSEC) 20 MG capsule Take 1 capsule (20 mg total) 2 (two) times daily before a meal by mouth. (Patient not taking: Reported on 01/11/2017) 60 capsule 5 Not Taking  . Prenatal Vit-Fe Fumarate-FA (PRENATAL COMPLETE) 14-0.4 MG TABS Take 1 tablet by mouth daily. 60 each 0 Taking  . Prenatal-DSS-FeCb-FeGl-FA (CITRANATAL BLOOM) 90-1 MG TABS Take 1 tablet daily by mouth. 30 tablet 12 Taking     Review of Systems  All systems reviewed and negative except as stated in HPI  Physical Exam Resp. rate 20, height 5\' 3"  (1.6 m), weight 101.6 kg (224 lb), last menstrual period 05/16/2016, unknown if currently breastfeeding. General appearance: alert, oriented, NAD Lungs: normal respiratory effort Heart: regular rate Abdomen: soft, non-tender; gravid, FH appropriate for GA Extremities: No calf  swelling or tenderness Presentation: cephalic confirmed with ultrasound  Fetal monitoring: FHR 140, moderate variability, +accel, -decel Uterine activity: irregular  Dilation: Closed Effacement (%): Thick Station: -3 Exam by:: Dr. Darin EngelsAbraham  Prenatal labs: ABO, Rh: A/Positive/-- (07/05 1150) Antibody: Negative (07/05 1150) Rubella: 6.34 (07/05 1150) RPR: Non Reactive (11/12 1140)  HBsAg: Negative (07/05 1150)  HIV: Non Reactive (11/12 1140)  GC/Chlamydia: negative on 06/2016 GBS:    2-hr GTT: 80/136/100 Genetic screening:  Primary screen and NT normal,  too late for AFP  Anatomy US: normal   Prenatal Transfer Tool  Maternal Diabetes: No Genetic Screening: Normal Maternal Ultrasounds/Referrals: Normal Fetal Ultrasounds or other Referrals:  Referred to Materal Fetal Medicine  Maternal Substance Abuse:  No Significant Maternal Medications:  None Significant Maternal Lab Results: Lab values include: Group B Strep negative  No results found for this or any previous visit (from the past 24 hour(s)).  Patient Active Problem List   Diagnosis Date Noted  . NST (non-stress test) with decelerations 02/21/2017  . Braxton Hick's contraction 01/31/2017  . Productive cough 01/31/2017  . Sore throat 01/31/2017  . Nasal congestion 01/31/2017  . History of inadequate prenatal care 12/01/2016  . Mild tetrahydrocannabinol (THC) abuse 08/25/2016  . 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 Vitamin D deficiency 08/02/2016  . Supervision of other normal pregnancy, antepartum 07/28/2016  . History of acute pyelonephritis 07/28/2016  . 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 GERD (gastroesophageal reflux disease) 03/05/2013    Assessment: Debbie Mercer is a 22 y.o. G3P1011 at 226w1d here for IOL for NST in office showing variable decelerations and BPP of 6/8  #Labor: anticipate NSVD. Will augment labor with buccal cytotec.  #Pain: Per patient request, planning on epidural  #FWB: Category 1  #ID:  GBS neg  #MOF: breast  #MOC:condoms #Circ:  Outpatient   Oralia ManisSherin Melany Wiesman 02/21/2017, 12:32 PM

## 2017-02-21 NOTE — Progress Notes (Signed)
Dimas Aguasalen R Nangle is a 22 y.o. G3P1011 at 485w1d by LMP admitted for induction of labor due to NST showing variable deceleration and BPP of 6/8 in office.  Subjective: Patient doing well. No concerns at this time. States she can feel her contractions stronger now. Hopeful that she will be more dilated at next cervical check.   Objective: BP 117/69   Pulse 100   Temp 98.8 F (37.1 C) (Oral)   Resp 18   Ht 5\' 3"  (1.6 m)   Wt 101.6 kg (224 lb)   LMP 05/16/2016   BMI 39.68 kg/m  No intake/output data recorded. No intake/output data recorded.  FHT:  FHR: 140 bpm, variability: moderate,  accelerations:  Present,  decelerations:  Absent UC:   irregular SVE:   Dilation: 1 Effacement (%): 50 Station: -3 Exam by:: Enis SlipperJane Bailey, RN  Labs: Lab Results  Component Value Date   WBC 11.1 (H) 02/21/2017   HGB 11.7 (L) 02/21/2017   HCT 37.0 02/21/2017   MCV 74.9 (L) 02/21/2017   PLT 294 02/21/2017    Assessment / Plan: IOL due to variable deceleration on NST and BPP of 6/8 in office   Labor: continue cytotec. Likely will insert foley bulb if further dialted  Fetal Wellbeing:  Category I Pain Control:  Per patient request, planning epidural I/D:  n/a Anticipated MOD:  NSVD  Roland Prine 02/21/2017, 7:29 PM

## 2017-02-21 NOTE — Anesthesia Pain Management Evaluation Note (Signed)
  CRNA Pain Management Visit Note  Patient: Debbie Mercer, 22 y.o., female  "Hello I am a member of the anesthesia team at [facility]. We have an anesthesia team available at all times to provide care throughout the hospital, including epidural management and anesthesia for C-section. I don't know your plan for the delivery whether it a natural birth, water birth, IV sedation, nitrous supplementation, doula or epidural, but we want to meet your pain goals."   1.Was your pain managed to your expectations on prior hospitalizations?   Yes   2.What is your expectation for pain management during this hospitalization?     Labor support without medications, Epidural and IV pain meds  3.How can we help you reach that goal? Would like to start natural  Record the patient's initial score and the patient's pain goal.   Pain: 3  Pain Goal: 6 [facility] wants you to be able to say your pain was always managed very well.  Khaiden Segreto 02/21/2017

## 2017-02-21 NOTE — Telephone Encounter (Signed)
Preadmission screen  

## 2017-02-21 NOTE — Progress Notes (Signed)
Debbie Mercer is a 22 y.o. G3P1011 at 7064w1d by LMP admitted for induction of labor due to NST showing variable deceleration and BPP of 6/8 in office.  Subjective: Patient doing well. No concerns at this time. Can feel some contractions but states they feel like braxton hixx contractions.   Objective: BP 116/75   Pulse (!) 103   Temp 99.1 F (37.3 C) (Oral)   Resp 20   Ht 5\' 3"  (1.6 m)   Wt 101.6 kg (224 lb)   LMP 05/16/2016   BMI 39.68 kg/m  No intake/output data recorded. No intake/output data recorded.  FHT:  FHR: 140 bpm, variability: moderate,  accelerations:  Present,  decelerations:  Absent UC:   irregular SVE:   Dilation: Closed Effacement (%): Thick Station: -3 Exam by:: Dr. Darin EngelsAbraham  Labs: Lab Results  Component Value Date   WBC 11.1 (H) 02/21/2017   HGB 11.7 (L) 02/21/2017   HCT 37.0 02/21/2017   MCV 74.9 (L) 02/21/2017   PLT 294 02/21/2017    Assessment / Plan: IOL due to variable deceleration on NST and BPP of 6/8 in office   Labor: continue cytotec. Will re-check cervix in 4 hours. Likely will insert foley bulb if further dialted  Fetal Wellbeing:  Category I Pain Control:  Per patient request, planning epidural I/D:  n/a Anticipated MOD:  NSVD  Debbie Mercer 02/21/2017, 1:50 PM

## 2017-02-21 NOTE — Progress Notes (Signed)
Labor Progress Note Debbie Mercer is a 22 y.o. G3P1011 at 548w1d presented for IOL for nrNST for fetal decel, BPP 6/8 S: no complaints  O:  BP 125/70 (BP Location: Right Arm)   Pulse (!) 106   Temp 99.2 F (37.3 C) (Oral)   Resp 18   Ht 5\' 3"  (1.6 m)   Wt 101.6 kg (224 lb)   LMP 05/16/2016   BMI 39.68 kg/m  EFM: 140/mod var/+accels, occasional early decel, some poor NST quality  CVE: Dilation: 1 Effacement (%): 50 Cervical Position: Middle Station: -3 Presentation: Vertex Exam by:: Rushie GoltzLeland, MD   A&P: 22 y.o. [id number] 438w1d  IOL for nrNST for fetal decel, BPP 6/8 #Labor: Not much cervical change with cytotec, second cytotec given. Patient does not want FB until cervix is more easy to reach. Will reeval at 0130. #Pain: controlled  #FWB: reactive NST #GBS negative  Alroy BailiffParker W Remona Boom, MD 11:00 PM

## 2017-02-21 NOTE — Progress Notes (Signed)
Patient reports decreased fetal movement and states she only feels baby move for a about 30 minutes a day. Pt reports pressure and irregular contractions.

## 2017-02-21 NOTE — Progress Notes (Signed)
   PRENATAL VISIT NOTE  Subjective:  Debbie Mercer is a 22 y.o. G3P1011 at 6189w1d being seen today for ongoing prenatal care.  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  Patient reports no bleeding, no leaking and occasional contractions.  Contractions: Irregular. Vag. Bleeding: None.  Movement: (!) Decreased. Denies leaking of fluid.   The following portions of the patient's history were reviewed and updated as appropriate: allergies, current medications, past family history, past medical history, past social history, past surgical history and problem list. Problem list updated.  Objective:   Vitals:   02/21/17 1001  BP: 124/85  Pulse: (!) 109  Weight: 224 lb (101.6 kg)    Fetal Status: Fetal Heart Rate (bpm): NST Fundal Height: 40 cm Movement: (!) Decreased  Presentation: Vertex  General:  Alert, oriented and cooperative. Patient is in no acute distress.  Skin: Skin is warm and dry. No rash noted.   Cardiovascular: Normal heart rate noted  Respiratory: Normal respiratory effort, no problems with respiration noted  Abdomen: Soft, gravid, appropriate for gestational age.  Pain/Pressure: Present     Pelvic: Cervical exam performed Dilation: Closed Effacement (%): 0 Station: -3  Extremities: Normal range of motion.  Edema: Trace  Mental Status:  Normal mood and affect. Normal behavior. Normal judgment and thought content.  NST: + accels at start, variable decel X1, moderate variability at start then minimal  variability after decel, Cat. 2 tracing. 2-3 irregular contractions on toco.   Assessment and Plan:  Pregnancy: G3P1011 at 5389w1d  1. Supervision of other normal pregnancy, antepartum     Cervix unchanged from previous exam.  NST: non-reactive, variable decel with minimal variability afterwards. Sent to MFM for BPP.   2. Antepartum variable deceleration     Non-reactive NST with variable decel.  - US MFM FETAL BPP WO NON STRESS; Future  Term labor symptoms and general obstetric precautions including but not limited to vaginal bleeding, contractions, leaking of fluid and fetal movement were reviewed in detail with the patient. Please refer to After Visit Summary for other counseling recommendations.  IOL scheduled for 41 weeks, depending on BPP may consider earlier delivery. F/U 4 weeks PP   Roe Coombsachelle A Shahil Speegle, CNM

## 2017-02-22 ENCOUNTER — Inpatient Hospital Stay (HOSPITAL_COMMUNITY): Payer: BLUE CROSS/BLUE SHIELD | Admitting: Anesthesiology

## 2017-02-22 LAB — RPR: RPR: NONREACTIVE

## 2017-02-22 MED ORDER — DIPHENHYDRAMINE HCL 50 MG/ML IJ SOLN: 12.5000 mg | INTRAMUSCULAR | Status: DC | PRN

## 2017-02-22 MED ORDER — OXYTOCIN 40 UNITS IN LACTATED RINGERS INFUSION - SIMPLE MED
1.0000 m[IU]/min | INTRAVENOUS | Status: DC
  Administered 2017-02-22: 2 m[IU]/min via INTRAVENOUS
  Administered 2017-02-23: 4 m[IU]/min via INTRAVENOUS

## 2017-02-22 MED ORDER — PHENYLEPHRINE 40 MCG/ML (10ML) SYRINGE FOR IV PUSH (FOR BLOOD PRESSURE SUPPORT)
80.0000 ug | PREFILLED_SYRINGE | INTRAVENOUS | Status: DC | PRN
  Filled 2017-02-22: qty 5
  Filled 2017-02-22 (×2): qty 10

## 2017-02-22 MED ORDER — PHENYLEPHRINE 40 MCG/ML (10ML) SYRINGE FOR IV PUSH (FOR BLOOD PRESSURE SUPPORT)
80.0000 ug | PREFILLED_SYRINGE | INTRAVENOUS | Status: DC | PRN
  Administered 2017-02-23 (×2): 80 ug via INTRAVENOUS
  Filled 2017-02-22: qty 5

## 2017-02-22 MED ORDER — DEXTROSE 5 % IV SOLN
180.0000 mg | Freq: Three times a day (TID) | INTRAVENOUS | Status: DC
  Administered 2017-02-22 – 2017-02-23 (×2): 180 mg via INTRAVENOUS
  Filled 2017-02-22 (×4): qty 4.5

## 2017-02-22 MED ORDER — LIDOCAINE HCL (PF) 1 % IJ SOLN
INTRAMUSCULAR | Status: DC | PRN
  Administered 2017-02-22: 13 mL via EPIDURAL

## 2017-02-22 MED ORDER — EPHEDRINE 5 MG/ML INJ
10.0000 mg | INTRAVENOUS | Status: DC | PRN
  Filled 2017-02-22: qty 2

## 2017-02-22 MED ORDER — FENTANYL 2.5 MCG/ML BUPIVACAINE 1/10 % EPIDURAL INFUSION (WH - ANES)
14.0000 mL/h | INTRAMUSCULAR | Status: DC | PRN
  Administered 2017-02-22 – 2017-02-23 (×5): 14 mL/h via EPIDURAL
  Filled 2017-02-22 (×4): qty 100

## 2017-02-22 MED ORDER — AMPICILLIN SODIUM 2 G IJ SOLR
2.0000 g | Freq: Four times a day (QID) | INTRAMUSCULAR | Status: DC
  Administered 2017-02-22 – 2017-02-23 (×3): 2 g via INTRAVENOUS
  Filled 2017-02-22 (×5): qty 2000

## 2017-02-22 MED ORDER — LACTATED RINGERS IV SOLN
500.0000 mL | Freq: Once | INTRAVENOUS | Status: AC
  Administered 2017-02-22: 500 mL via INTRAVENOUS

## 2017-02-22 MED ORDER — TERBUTALINE SULFATE 1 MG/ML IJ SOLN
0.2500 mg | Freq: Once | INTRAMUSCULAR | Status: DC | PRN
  Filled 2017-02-22: qty 1

## 2017-02-22 NOTE — Progress Notes (Signed)
Pharmacy Antibiotic Note  Debbie Mercer is a 22 y.o. female admitted on 02/21/2017 for IOL due to postdate and variable decels on NST.  Pharmacy has been consulted for Gentamicin dosing for Triple I during labor.  Plan: Gentamicin 180mg  IV q8h Will plan to draw SCr if continued postpartum or > 72 hours  Height: 5\' 3"  (160 cm) Weight: 224 lb (101.6 kg) IBW/kg (Calculated) : 52.4  Adjusted/ Dosing weight: 67.2kg  Temp (24hrs), Avg:98.5 F (36.9 C), Min:98.4 F (36.9 C), Max:98.7 F (37.1 C)  Recent Labs  Lab 02/21/17 1205  WBC 11.1*    CrCl cannot be calculated (Patient's most recent lab result is older than the maximum 21 days allowed.).    Allergies  Allergen Reactions  . Shellfish Allergy Anaphylaxis and Itching    Antimicrobials this admission: Ampicillin 2 gram IV q6h    Thank you for allowing pharmacy to be a part of this patient's care.  Claybon Jabsngel, Brya Simerly G 02/22/2017 10:02 PM

## 2017-02-22 NOTE — Progress Notes (Signed)
Debbie Mercer is a 22 y.o. G3P1011 at 5084w2d by LMP admitted for induction of labor due to NST showing variable deceleration and BPP of 6/8.  Subjective: Patient resting comfortably. Foley bulb fell out and patient now 5/70/-3. Pitocin stated   Objective: BP 118/67   Pulse 97   Temp 98.7 F (37.1 C) (Oral)   Resp 16   Ht 5\' 3"  (1.6 m)   Wt 224 lb (101.6 kg)   LMP 05/16/2016   BMI 39.68 kg/m  No intake/output data recorded. No intake/output data recorded.  FHT:  FHR: 135 bpm, variability: moderate,  accelerations:  Present,  decelerations:  Absent UC:   irregular, every 2-6 minutes,  SVE:   Dilation: 5 Effacement (%): 70 Station: -3 Exam by:: Earlene Plateravis, RN   Labs: Lab Results  Component Value Date   WBC 11.1 (H) 02/21/2017   HGB 11.7 (L) 02/21/2017   HCT 37.0 02/21/2017   MCV 74.9 (L) 02/21/2017   PLT 294 02/21/2017    Assessment / Plan: Induction of labor due to NST showing variable deceleration and BPP of 6/8.,  progressing well on pitocin  Labor: Progressing on Pitocin, will continue to increase then AROM Preeclampsia:  N/A Fetal Wellbeing:  Category I Pain Control:  epidural planned  I/D:  n/a Anticipated MOD:  NSVD  Si Jachim 02/22/2017, 1:16 PM

## 2017-02-22 NOTE — Progress Notes (Signed)
Patient ID: Debbie Mercer, female   DOB: 24-Feb-1995, 24 y.o.   MRN: [id number] Debbie Mercer is a 22 y.o. G3P1011 at 6557w2d admitted for induction of labor due to postdates, BPP 6/8.  Subjective: Some cramping w/ uc's, ok to try to place foley bulb now  Objective: BP (!) 104/53   Pulse (!) 109   Temp 98.5 F (36.9 C) (Oral)   Resp 16   Ht 5\' 3"  (1.6 m)   Wt 101.6 kg (224 lb)   LMP 05/16/2016   BMI 39.68 kg/m  No intake/output data recorded.  FHT:  FHR: 140 bpm, variability: moderate,  accelerations:  Present,  decelerations:  Absent UC:   q 2-244mins  SVE:   Dilation: 1.5 Effacement (%): 50 Station: -3 Exam by:: Shawna ClampBooker, Antar Milks, CNM   Cervical foley bulb inserted and inflated w/ 60ml LR w/o difficulty   Labs: Lab Results  Component Value Date   WBC 11.1 (H) 02/21/2017   HGB 11.7 (L) 02/21/2017   HCT 37.0 02/21/2017   MCV 74.9 (L) 02/21/2017   PLT 294 02/21/2017    Assessment / Plan: IOL d/t postdates, BPP 6/8; s/p cytotec x 3, foley bulb now placed. Continue cytotec  Labor: cervical ripening phase Fetal Wellbeing:  Category I Pain Control:  n/a Pre-eclampsia: n/a I/D:  n/a Anticipated MOD:  NSVD  Marge DuncansBooker, Sephira Zellman Randall CNM, WHNP-BC 02/22/2017, 2:31 AM

## 2017-02-22 NOTE — Anesthesia Preprocedure Evaluation (Signed)
Anesthesia Evaluation  Patient identified by MRN, date of birth, ID band Patient awake    Reviewed: Allergy & Precautions, NPO status , Patient's Chart, lab work & pertinent test results  Airway Mallampati: II  TM Distance: >3 FB Neck ROM: Full    Dental no notable dental hx.    Pulmonary neg pulmonary ROS,    Pulmonary exam normal breath sounds clear to auscultation       Cardiovascular negative cardio ROS Normal cardiovascular exam Rhythm:Regular Rate:Normal     Neuro/Psych negative neurological ROS  negative psych ROS   GI/Hepatic negative GI ROS, Neg liver ROS, GERD  ,  Endo/Other  negative endocrine ROS  Renal/GU negative Renal ROS  negative genitourinary   Musculoskeletal negative musculoskeletal ROS (+)   Abdominal   Peds negative pediatric ROS (+)  Hematology negative hematology ROS (+)   Anesthesia Other Findings   Reproductive/Obstetrics negative OB ROS (+) Pregnancy                             Anesthesia Physical Anesthesia Plan  ASA: II  Anesthesia Plan: Epidural   Post-op Pain Management:    Induction:   PONV Risk Score and Plan:   Airway Management Planned:   Additional Equipment:   Intra-op Plan:   Post-operative Plan:   Informed Consent:   Plan Discussed with:   Anesthesia Plan Comments:         Anesthesia Quick Evaluation  

## 2017-02-22 NOTE — Progress Notes (Addendum)
Debbie Mercer is a 22 y.o. G3P1011 at 8055w2d by LMP admitted for induction of labor due to NST showing variable deceleration and BPP of 6/9 in office   .  Subjective:  Patient doing well on pitocin 8. Feeling some contractions but breathing through them. No LOF  Objective: BP 106/68   Pulse (!) 104   Temp 98.7 F (37.1 C) (Oral)   Resp 16   Ht 5\' 3"  (1.6 m)   Wt 224 lb (101.6 kg)   LMP 05/16/2016   BMI 39.68 kg/m  No intake/output data recorded. No intake/output data recorded.  FHT:  FHR: 145 bpm, variability: moderate,  accelerations:  Present,  decelerations:  Absent UC:   regular, every 1-2 minutes SVE:   Dilation: 6 Effacement (%): 70 Station: -2 Exam by:: Dr. Rachelle HoraMoss  Labs: Lab Results  Component Value Date   WBC 11.1 (H) 02/21/2017   HGB 11.7 (L) 02/21/2017   HCT 37.0 02/21/2017   MCV 74.9 (L) 02/21/2017   PLT 294 02/21/2017    Assessment / Plan: Induction of labor due to NST showing variable deceleration and BPP of 6/9 in office,  progressing well on pitocin  Labor: cytotec x4, FB and now pitocin 8. AROM 1448 with mec Preeclampsia:  n/a Fetal Wellbeing:  Category I Pain Control:  Epidural requested  I/D:  n/a Anticipated MOD:  NSVD  [name] 02/22/2017, 2:53 PM

## 2017-02-22 NOTE — Anesthesia Procedure Notes (Signed)
Epidural Patient location during procedure: OB Start time: 02/22/2017 3:29 PM End time: 02/22/2017 3:45 PM  Staffing Anesthesiologist: Lowella CurbMiller, Chioma Mukherjee Ray, MD Performed: anesthesiologist   Preanesthetic Checklist Completed: patient identified, site marked, surgical consent, pre-op evaluation, timeout performed, IV checked, risks and benefits discussed and monitors and equipment checked  Epidural Patient position: sitting Prep: ChloraPrep Patient monitoring: heart rate, cardiac monitor, continuous pulse ox and blood pressure Approach: midline Location: L2-L3 Injection technique: LOR saline  Needle:  Needle type: Tuohy  Needle gauge: 17 G Needle length: 9 cm Needle insertion depth: 7 cm Catheter type: closed end flexible Catheter size: 20 Guage Catheter at skin depth: 11 cm Test dose: negative  Assessment Events: blood not aspirated, injection not painful, no injection resistance, negative IV test and no paresthesia  Additional Notes Reason for block:procedure for pain

## 2017-02-22 NOTE — Progress Notes (Signed)
Debbie Mercer is a [age] y.o. G3P1011 at 3551w2d by LMP admitted for induction of labor due to NST showing variable deceleration and BPP of 6/9 in office .  Subjective: Patient with FB in place pain well controlled. No HA, no N/V  Objective: BP 116/74   Pulse 93   Temp 98.5 F (36.9 C) (Oral)   Resp 16   Ht 5\' 3"  (1.6 m)   Wt 224 lb (101.6 kg)   LMP 05/16/2016   BMI 39.68 kg/m  No intake/output data recorded. No intake/output data recorded.  FHT:  FHR: 135 bpm, variability: moderate,  accelerations:  Present,  decelerations:  Absent UC:   none SVE:   Dilation: 1.5 Effacement (%): 50 Station: -3 Exam by:: Shawna ClampBooker, Kimberly, CNM   Labs: Lab Results  Component Value Date   WBC 11.1 (H) 02/21/2017   HGB 11.7 (L) 02/21/2017   HCT 37.0 02/21/2017   MCV 74.9 (L) 02/21/2017   PLT 294 02/21/2017    Assessment / Plan: Induction of labor due to NST showing variable deceleration and BPP of 6/9,  progressing well on pitocin  Labor: cytotec x4 , foley bulb in place continue ripening  Preeclampsia:  n/a Fetal Wellbeing:  Category I Pain Control:  n/a I/D:  n/a Anticipated MOD:  NSVD  Debbie Mercer 02/22/2017, 9:30 AM

## 2017-02-22 NOTE — Progress Notes (Signed)
Labor Progress Note Debbie Mercer is a 22 y.o. G3P1011 at 3714w2d presented for IOL for nrNST.  S: No complaints  O:  BP 115/67   Pulse (!) 110   Temp 98.4 F (36.9 C) (Oral)   Resp 18   Ht 5\' 3"  (1.6 m)   Wt 101.6 kg (224 lb)   LMP 05/16/2016   BMI 39.68 kg/m  EFM: 140/mod var/+accels, early decels Toco: CTX Q762min  CVE: Dilation: 6 Effacement (%): 70 Cervical Position: Middle Station: -2 Presentation: Vertex Exam by:: Dr. Rachelle HoraMoss   A&P: 22 y.o. G3P1011 7314w2d  IOL for nrNST. #Labor: Cervical exam unchanged, head is very high. IUPC placed. Continue to titrate pitocin. # Triple I: Having low grade temperatures and tachycardia. Started on ampicillin and gentamycin. #Pain: epidural, still does not tolerate cervical exams well. #FWB: reactive NST #GBS negative  Alroy BailiffParker W Quinlyn Tep, MD 10:03 PM

## 2017-02-23 ENCOUNTER — Other Ambulatory Visit: Payer: Self-pay

## 2017-02-23 DIAGNOSIS — Z3A40 40 weeks gestation of pregnancy: Secondary | ICD-10-CM

## 2017-02-23 MED ORDER — ZOLPIDEM TARTRATE 5 MG PO TABS: 5.0000 mg | ORAL_TABLET | Freq: Every evening | ORAL | Status: DC | PRN

## 2017-02-23 MED ORDER — ONDANSETRON HCL 4 MG PO TABS: 4.0000 mg | ORAL_TABLET | ORAL | Status: DC | PRN

## 2017-02-23 MED ORDER — DIBUCAINE 1 % RE OINT
1.0000 "application " | TOPICAL_OINTMENT | RECTAL | Status: DC | PRN
  Administered 2017-02-23: 1 via RECTAL
  Filled 2017-02-23: qty 28

## 2017-02-23 MED ORDER — COCONUT OIL OIL
1.0000 "application " | TOPICAL_OIL | Status: DC | PRN
  Filled 2017-02-23: qty 120

## 2017-02-23 MED ORDER — IBUPROFEN 600 MG PO TABS
600.0000 mg | ORAL_TABLET | Freq: Four times a day (QID) | ORAL | Status: DC
  Administered 2017-02-23 – 2017-02-25 (×7): 600 mg via ORAL
  Filled 2017-02-23 (×7): qty 1

## 2017-02-23 MED ORDER — WITCH HAZEL-GLYCERIN EX PADS
1.0000 "application " | MEDICATED_PAD | CUTANEOUS | Status: DC | PRN
  Administered 2017-02-23: 1 via TOPICAL

## 2017-02-23 MED ORDER — PRENATAL MULTIVITAMIN CH
1.0000 | ORAL_TABLET | Freq: Every day | ORAL | Status: DC
  Administered 2017-02-24: 1 via ORAL
  Filled 2017-02-23: qty 1

## 2017-02-23 MED ORDER — SENNOSIDES-DOCUSATE SODIUM 8.6-50 MG PO TABS
2.0000 | ORAL_TABLET | ORAL | Status: DC
  Administered 2017-02-24 (×2): 2 via ORAL
  Filled 2017-02-23 (×2): qty 2

## 2017-02-23 MED ORDER — TETANUS-DIPHTH-ACELL PERTUSSIS 5-2.5-18.5 LF-MCG/0.5 IM SUSP: 0.5000 mL | Freq: Once | INTRAMUSCULAR | Status: DC

## 2017-02-23 MED ORDER — DIPHENHYDRAMINE HCL 25 MG PO CAPS: 25.0000 mg | ORAL_CAPSULE | Freq: Four times a day (QID) | ORAL | Status: DC | PRN

## 2017-02-23 MED ORDER — ONDANSETRON HCL 4 MG/2ML IJ SOLN: 4.0000 mg | INTRAMUSCULAR | Status: DC | PRN

## 2017-02-23 MED ORDER — SIMETHICONE 80 MG PO CHEW: 80.0000 mg | CHEWABLE_TABLET | ORAL | Status: DC | PRN

## 2017-02-23 MED ORDER — BENZOCAINE-MENTHOL 20-0.5 % EX AERO
1.0000 "application " | INHALATION_SPRAY | CUTANEOUS | Status: DC | PRN
  Administered 2017-02-23: 1 via TOPICAL
  Filled 2017-02-23 (×2): qty 56

## 2017-02-23 MED ORDER — ACETAMINOPHEN 325 MG PO TABS
650.0000 mg | ORAL_TABLET | ORAL | Status: DC | PRN
  Administered 2017-02-24: 650 mg via ORAL

## 2017-02-23 NOTE — Anesthesia Postprocedure Evaluation (Signed)
Anesthesia Post Note  Patient: Kathaleya R Ferrebee  Procedure(s) Performed: AN AD HOC LABOR EPIDURAL     Patient location during evaluation: Mother Baby Anesthesia Type: Epidural Level of consciousness: awake, awake and alert and oriented Pain management: pain level controlled Vital Signs Assessment: post-procedure vital signs reviewed and stable Respiratory status: spontaneous breathing, nonlabored ventilation and respiratory function stable Cardiovascular status: stable Postop Assessment: no headache, no backache, no apparent nausea or vomiting, adequate PO intake and patient able to bend at knees Anesthetic complications: no    Last Vitals:  Vitals:   02/23/17 1425 02/23/17 1622  BP: 134/76 129/63  Pulse: 97 (!) 102  Resp: 20   Temp: 37.7 C 37.6 C  SpO2:      Last Pain:  Vitals:   02/23/17 1627  TempSrc:   PainSc: 2    Pain Goal: Patients Stated Pain Goal: 3 (02/23/17 1627)               Taisa Deloria

## 2017-02-23 NOTE — Progress Notes (Signed)
   Subjective: Debbie Mercer is a 22 y.o. G3P1011 at 8949w3d by LMP admitted for induction of labor due to NRNST.  Objective: BP (!) 106/58   Pulse (!) 119   Temp 99.5 F (37.5 C) (Axillary)   Resp 18   Ht 5\' 3"  (1.6 m)   Wt 224 lb (101.6 kg)   LMP 05/16/2016   BMI 39.68 kg/m  I/O last 3 completed shifts: In: -  Out: 1250 [Urine:1250] No intake/output data recorded.  FHT:  FHR: 145 bpm, variability: minimal ,  accelerations:  Abscent,  decelerations:  Absent UC:   regular, every 2-4 minutes SVE:   Dilation: 9 Effacement (%): 90 Station: 0, +1 Exam by:: Raelyn Moraolitta Carianna Lague, CNM Pitocin 16 mU MVU: 130-150  Labs: Lab Results  Component Value Date   WBC 11.1 (H) 02/21/2017   HGB 11.7 (L) 02/21/2017   HCT 37.0 02/21/2017   MCV 74.9 (L) 02/21/2017   PLT 294 02/21/2017    Assessment / Plan: Induction of labor due to non-reassuring fetal testing,  progressing well on pitocin  Labor: Progressing on Pitocin Preeclampsia:  n/a Fetal Wellbeing:  Category I Pain Control:  Epidural I/D:  on Triple I abx Anticipated MOD:  NSVD  Raelyn Moraolitta Anabell Swint, MSN, CNM 02/23/2017, 9:17 AM

## 2017-02-23 NOTE — Anesthesia Postprocedure Evaluation (Signed)
Anesthesia Post Note  Patient: Debbie Mercer  Procedure(s) Performed: AN AD HOC LABOR EPIDURAL     Patient location during evaluation: Mother Baby Anesthesia Type: Epidural Level of consciousness: awake and alert Pain management: pain level controlled Vital Signs Assessment: post-procedure vital signs reviewed and stable Respiratory status: spontaneous breathing, nonlabored ventilation and respiratory function stable Cardiovascular status: stable Postop Assessment: no headache, no backache and epidural receding Anesthetic complications: no    Last Vitals:  Vitals:   02/23/17 0430 02/23/17 0500  BP: 123/75 111/65  Pulse: (!) 114 (!) 118  Resp: 18 18  Temp:  37.8 C    Last Pain:  Vitals:   02/23/17 0500  TempSrc: Oral  PainSc:    Pain Goal: Patients Stated Pain Goal: 5 (02/21/17 1320)               [name]

## 2017-02-23 NOTE — Progress Notes (Signed)
Labor Progress Note Debbie Mercer is a 22 y.o. G3P1011 at 5333w3d presented for IOL for nrNST.  S: No complaints   O:  BP (!) 106/53   Pulse (!) 107   Temp 99.9 F (37.7 C) (Oral)   Resp 20   Ht 5\' 3"  (1.6 m)   Wt 101.6 kg (224 lb)   LMP 05/16/2016   BMI 39.68 kg/m  EFM: 145/mod var/+accels, no decels  CVE: Dilation: 6 Effacement (%): 80 Cervical Position: Middle Station: 0, +1 Presentation: Vertex Exam by:: Cletis MediaK. Anderson, RN   A&P: 22 y.o. G3P1011 833w3d IOL for nrNST.  #Labor: Head is more approximated to cervix, but contractions remain inadequate, will continue pit gtt, consider pitocin break in near future #Triple I: continue amp/gent coverage #Pain: epidural  #FWB: reactive NST #GBS negative  Alroy BailiffParker W Kenedee Molesky, MD 3:48 AM

## 2017-02-24 NOTE — Progress Notes (Signed)
Set up patient with a sitz bath for hemorrhoids.  Went over how to use it, and encouraged her to continue to  use it at home

## 2017-02-24 NOTE — Lactation Note (Signed)
This note was copied from a baby's chart. Lactation Consultation Note  Patient Name: Boy Gaetana Michaelisalen Milosevic ZOXWR'UToday's Date: 02/24/2017 Reason for consult: Initial assessment;1st time breastfeeding;Term Newborn is 3122 hours old.  Mom reports feedings are going well.  Assisted with positioning baby in football hold on left breast.  After a few attempts baby latched well.  Baby sleepy and needing stimulation to stay active.  Reviewed basics and answered questions.  Breastfeeding consultation services and support information given to mom.  Encouraged to call for assist prn.  Maternal Data Has patient been taught Hand Expression?: Yes Does the patient have breastfeeding experience prior to this delivery?: No  Feeding Feeding Type: Breast Fed Length of feed: 25 min  LATCH Score Latch: Repeated attempts needed to sustain latch, nipple held in mouth throughout feeding, stimulation needed to elicit sucking reflex.  Audible Swallowing: A few with stimulation  Type of Nipple: Everted at rest and after stimulation  Comfort (Breast/Nipple): Soft / non-tender  Hold (Positioning): Assistance needed to correctly position infant at breast and maintain latch.  LATCH Score: 7  Interventions Interventions: Breast feeding basics reviewed;Assisted with latch;Breast compression;Adjust position;Breast massage;Support pillows;Hand express;Position options  Lactation Tools Discussed/Used     Consult Status Consult Status: Follow-up Date: 02/25/17 Follow-up type: In-patient    Huston FoleyMOULDEN, Laquanna Veazey S 02/24/2017, 10:43 AM

## 2017-02-24 NOTE — Clinical Social Work Maternal (Signed)
CLINICAL SOCIAL WORK MATERNAL/CHILD NOTE  Patient Details  Name: Debbie Mercer MRN: [id number] Date of Birth: 01/16/1996  Date:  02/24/2017  Clinical Social Worker Initiating Note:  Eashan Schipani Boyd-Gilyard Date/Time: Initiated:  02/24/17/1140     Child's Name:  Prynce Ward   Biological Parents:  Mother, Father(FOB is Dearron Ward 12/21/1991)   Need for Interpreter:  None   Reason for Referral:  Behavioral Health Concerns, Current Substance Use/Substance Use During Pregnancy (hx of marijuana use.)   Address:  [address] [place] [address]    Phone number:  [phone] (home)     Additional phone number:   Household Members/Support Persons (HM/SP):   Household Member/Support Person 1   HM/SP Name Relationship DOB or Age  HM/SP -1 Maison Ward son 03/14/2013  HM/SP -2        HM/SP -3        HM/SP -4        HM/SP -5        HM/SP -6        HM/SP -7        HM/SP -8          Natural Supports (not living in the home):  Parent, Spouse/significant other, Extended Family   Professional Supports: None   Employment: Full-time   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   Education:  High school graduate   Homebound arranged:    Financial Resources:  Medicaid   Other Resources:  Food Stamps , WIC   Cultural/Religious Considerations Which May Impact Care:  None Reported  Strengths:  Ability to meet basic needs , Home prepared for child    Psychotropic Medications:         Pediatrician:       Pediatrician List:       [place]    [place]    [place]    [place]    [place]      Pediatrician Fax Number:    Risk Factors/Current Problems:  Substance Use , Mental Health Concerns    Cognitive State:  Able to Concentrate , Alert , Linear Thinking , Insightful    Mood/Affect:  Bright , Happy , Comfortable , Interested , Relaxed    CSW Assessment: CSW met with MOB in room 141.  When CSW arrived, MOB was bonding with  infant as evidence by MOB engaging in breastfeeding.  CSW's explained CSW's role and encouraged MOB to ask questions during the assessment.  MOB was polite, easy to engage and receptive to meeting with CSW.  CSW asked about MOB SA hx.  MOB acknowledged the use of marijuana prior to pregnancy confirmation. MOB openly discussed smoking marijuana occasionally socially.  MOB reported MOB's last use was June 2018, and MOB denied having a SA problem.  MOB also denied the use of all other illicit substance. CSW informed MOB of hospital's SA policy and MOB understood. MOB is aware that CSW will monitor infant's UDS and CDS and will make a report to [place] CPS if either are positive without an explanation; MOB denied CPS hx. MOB also declined resources offered by CSW.    CSW also asked about MOB's MH hx.  MOB denied having any MH dx.  However, she discussed her experience with inpatient admission July 2018.  MOB described her experience as being horrific and was not helpful.  MOB shared that MOB never admitted to SI or HI and went to seek help for anger management. MOB denied having any   currently anger symptoms and denied SI and HI.  CSW utilized the opportunity to provided MOB education regarding PPD. CSW provided education regarding the baby blues period vs. perinatal mood disorders, discussed treatment and gave resources for mental health follow up if concerns arise.  CSW recommends self-evaluation during the postpartum time period using the New Mom Checklist from Postpartum Progress and encouraged MOB to contact a medical professional if symptoms are noted at any time. MOB presented with insight and awareness and did not demonstrate any acute signs or symptoms for anxiety/depression.    CSW provided review of Sudden Infant Death Syndrome (SIDS) precautions.    MOB reported having all necessary items for infant and feeling prepared to parent. MOB also communicated a wealth of support from MOB's and FOB's  family.  CSW Plan/Description:  No Further Intervention Required/No Barriers to Discharge, Perinatal Mood and Anxiety Disorder (PMADs) Education, Hospital Drug Screen Policy Information, CSW Will Continue to Monitor Umbilical Cord Tissue Drug Screen Results and Make Report if Warranted, Sudden Infant Death Syndrome (SIDS) Education   Sheyli Horwitz Boyd-Gilyard, MSW, LCSW Clinical Social Work [phone]    Jalei Shibley D BOYD-GILYARD, LCSW 02/24/2017, 11:56 AM 

## 2017-02-24 NOTE — Progress Notes (Signed)
POSTPARTUM PROGRESS NOTE  Post Partum Day 1  Subjective:  Debbie Mercer is a 22 y.o. G3P1011 s/p SVD at 2770w4d.  No acute events overnight.  Pt denies problems with ambulating, voiding or po intake.  She denies nausea or vomiting.  Pain is moderately controlled.  She has had flatus.  Lochia Minimal.   Objective: Blood pressure 107/66, pulse 88, temperature 97.8 F (36.6 C), temperature source Oral, resp. rate 16, height 5\' 3"  (1.6 m), weight 103.6 kg (228 lb 4.8 oz), last menstrual period 05/16/2016, SpO2 99 %, unknown if currently breastfeeding.  Physical Exam:  General: alert, cooperative and no distress Chest: no respiratory distress Abdomen: soft Uterine Fundus: firm, appropriately tender DVT Evaluation: No calf swelling or tenderness Extremities: No edema Skin: warm, dry  Recent Labs    02/21/17 1205  HGB 11.7*  HCT 37.0    Assessment/Plan: Debbie Mercer is a 22 y.o. G3P1011 s/p SVD at 4170w4d   PPD#1 - Doing well Contraception: Condoms Feeding: breast, bottle Dispo: Plan for discharge tomorrow.   LOS: 3 days   Mikal PlaneBenjamin A Krista Som MS3 02/24/2017, 7:34 AM

## 2017-02-25 ENCOUNTER — Encounter (HOSPITAL_COMMUNITY): Payer: Self-pay | Admitting: *Deleted

## 2017-02-25 MED ORDER — IBUPROFEN 600 MG PO TABS: 600.0000 mg | ORAL_TABLET | Freq: Four times a day (QID) | ORAL | 0 refills | Status: DC | PRN

## 2017-02-25 NOTE — Discharge Instructions (Signed)
Vaginal Delivery, Care After °Refer to this sheet in the next few weeks. These instructions provide you with information about caring for yourself after vaginal delivery. Your health care provider may also give you more specific instructions. Your treatment has been planned according to current medical practices, but problems sometimes occur. Call your health care provider if you have any problems or questions. °What can I expect after the procedure? °After vaginal delivery, it is common to have: °· Some bleeding from your vagina. °· Soreness in your abdomen, your vagina, and the area of skin between your vaginal opening and your anus (perineum). °· Pelvic cramps. °· Fatigue. ° °Follow these instructions at home: °Medicines °· Take over-the-counter and prescription medicines only as told by your health care provider. °· If you were prescribed an antibiotic medicine, take it as told by your health care provider. Do not stop taking the antibiotic until it is finished. °Driving ° °· Do not drive or operate heavy machinery while taking prescription pain medicine. °· Do not drive for 24 hours if you received a sedative. °Lifestyle °· Do not drink alcohol. This is especially important if you are breastfeeding or taking medicine to relieve pain. °· Do not use tobacco products, including cigarettes, chewing tobacco, or e-cigarettes. If you need help quitting, ask your health care provider. °Eating and drinking °· Drink at least 8 eight-ounce glasses of water every day unless you are told not to by your health care provider. If you choose to breastfeed your baby, you may need to drink more water than this. °· Eat high-fiber foods every day. These foods may help prevent or relieve constipation. High-fiber foods include: °? Whole grain cereals and breads. °? Brown rice. °? Beans. °? Fresh fruits and vegetables. °Activity °· Return to your normal activities as told by your health care provider. Ask your health care provider  what activities are safe for you. °· Rest as much as possible. Try to rest or take a nap when your baby is sleeping. °· Do not lift anything that is heavier than your baby or 10 lb (4.5 kg) until your health care provider says that it is safe. °· Talk with your health care provider about when you can engage in sexual activity. This may depend on your: °? Risk of infection. °? Rate of healing. °? Comfort and desire to engage in sexual activity. °Vaginal Care °· If you have an episiotomy or a vaginal tear, check the area every day for signs of infection. Check for: °? More redness, swelling, or pain. °? More fluid or blood. °? Warmth. °? Pus or a bad smell. °· Do not use tampons or douches until your health care provider says this is safe. °· Watch for any blood clots that may pass from your vagina. These may look like clumps of dark red, brown, or black discharge. °General instructions °· Keep your perineum clean and dry as told by your health care provider. °· Wear loose, comfortable clothing. °· Wipe from front to back when you use the toilet. °· Ask your health care provider if you can shower or take a bath. If you had an episiotomy or a perineal tear during labor and delivery, your health care provider may tell you not to take baths for a certain length of time. °· Wear a bra that supports your breasts and fits you well. °· If possible, have someone help you with household activities and help care for your baby for at least a few days after   you leave the hospital. °· Keep all follow-up visits for you and your baby as told by your health care provider. This is important. °Contact a health care provider if: °· You have: °? Vaginal discharge that has a bad smell. °? Difficulty urinating. °? Pain when urinating. °? A sudden increase or decrease in the frequency of your bowel movements. °? More redness, swelling, or pain around your episiotomy or vaginal tear. °? More fluid or blood coming from your episiotomy or  vaginal tear. °? Pus or a bad smell coming from your episiotomy or vaginal tear. °? A fever. °? A rash. °? Little or no interest in activities you used to enjoy. °? Questions about caring for yourself or your baby. °· Your episiotomy or vaginal tear feels warm to the touch. °· Your episiotomy or vaginal tear is separating or does not appear to be healing. °· Your breasts are painful, hard, or turn red. °· You feel unusually sad or worried. °· You feel nauseous or you vomit. °· You pass large blood clots from your vagina. If you pass a blood clot from your vagina, save it to show to your health care provider. Do not flush blood clots down the toilet without having your health care provider look at them. °· You urinate more than usual. °· You are dizzy or light-headed. °· You have not breastfed at all and you have not had a menstrual period for 12 weeks after delivery. °· You have stopped breastfeeding and you have not had a menstrual period for 12 weeks after you stopped breastfeeding. °Get help right away if: °· You have: °? Pain that does not go away or does not get better with medicine. °? Chest pain. °? Difficulty breathing. °? Blurred vision or spots in your vision. °? Thoughts about hurting yourself or your baby. °· You develop pain in your abdomen or in one of your legs. °· You develop a severe headache. °· You faint. °· You bleed from your vagina so much that you fill two sanitary pads in one hour. °This information is not intended to replace advice given to you by your health care provider. Make sure you discuss any questions you have with your health care provider. °Document Released: 01/08/2000 Document Revised: 06/24/2015 Document Reviewed: 01/25/2015 °Elsevier Interactive Patient Education © 2018 Elsevier Inc. ° °

## 2017-02-25 NOTE — Discharge Summary (Signed)
OB Discharge Summary     Patient Name: Debbie Mercer DOB: [birth year] MRN: [id number]  Date of admission: 02/21/2017 Delivering MD: Raelyn Mora   Date of discharge: 02/25/2017  Admitting diagnosis: INDUCTION Intrauterine pregnancy: [redacted]w[redacted]d     Secondary diagnosis:  Principal Problem:   Supervision of other normal pregnancy, antepartum Active Problems:   Mild tetrahydrocannabinol (THC) abuse   History of inadequate prenatal care   NST (non-stress test) with decelerations  Additional problems: none     Discharge diagnosis: Term Pregnancy Delivered                                                                                                Post partum procedures:none  Augmentation: AROM, Pitocin, Cytotec and Foley Balloon  Complications: Intrauterine Inflammation or infection (Chorioamniotis)  Hospital course:  Induction of Labor With Vaginal Delivery   22 y.o. yo G3P1011 at [redacted]w[redacted]d was admitted to the hospital 02/21/2017 for induction of labor.  Indication for induction: non reactive NST. Patient had a labor course as follows:  The usual ripening methods were used for IOL. She developed a fever and was given Amp/Gent. Membrane Rupture Time/Date: 2:48 PM ,02/22/2017   Intrapartum Procedures: Episiotomy: None [1]                                         Lacerations:  2nd degree [3];Vaginal [6]  Patient had delivery of a Viable infant.  Information for the patient's newborn:  Skylyn, Slezak [[id number]]  Delivery Method: Vag-Spont   02/23/2017  Details of delivery can be found in separate delivery note.  Patient had a routine postpartum course. Her vitals remained stable. Patient is discharged home 02/25/17.  Physical exam  Vitals:   02/23/17 1930 02/24/17 0330 02/24/17 0556 02/24/17 1753  BP: 116/68 111/66 107/66 113/69  Pulse: 93 83 88 99  Resp: 20 18 16 18   Temp: 99.1 F (37.3 C) 97.9 F (36.6 C) 97.8 F (36.6 C) 98.9 F (37.2 C)  TempSrc: Oral Oral Oral Oral   SpO2:    100%  Weight:   103.6 kg (228 lb 4.8 oz)   Height:       General: alert and cooperative Lochia: appropriate Uterine Fundus: firm Incision: N/A DVT Evaluation: No evidence of DVT seen on physical exam. Labs: Lab Results  Component Value Date   WBC 11.1 (H) 02/21/2017   HGB 11.7 (L) 02/21/2017   HCT 37.0 02/21/2017   MCV 74.9 (L) 02/21/2017   PLT 294 02/21/2017   CMP Latest Ref Rng & Units 08/20/2016  Glucose 65 - 99 mg/dL 99  BUN 6 - 20 mg/dL 8  Creatinine 5.78 - 4.69 mg/dL 6.29  Sodium 528 - 413 mmol/L 136  Potassium 3.5 - 5.1 mmol/L 3.4(L)  Chloride 101 - 111 mmol/L 105  CO2 22 - 32 mmol/L 22  Calcium 8.9 - 10.3 mg/dL 9.2  Total Protein 6.5 - 8.1 g/dL 7.0  Total Bilirubin 0.3 - 1.2 mg/dL 0.8  Alkaline Phos 38 -  126 U/L 50  AST 15 - 41 U/L 22  ALT 14 - 54 U/L 18    Discharge instruction: per After Visit Summary and "Baby and Me Booklet".  After visit meds:  Allergies as of 02/25/2017      Reactions   Shellfish Allergy Anaphylaxis, Itching      Medication List    STOP taking these medications   azithromycin 250 MG tablet Commonly known as:  ZITHROMAX   chlorpheniramine-HYDROcodone 10-8 MG/5ML Suer Commonly known as:  TUSSIONEX PENNKINETIC ER   omeprazole 20 MG capsule Commonly known as:  PRILOSEC     TAKE these medications   CITRANATAL BLOOM 90-1 MG Tabs Take 1 tablet daily by mouth.   ibuprofen 600 MG tablet Commonly known as:  ADVIL,MOTRIN Take 1 tablet (600 mg total) by mouth every 6 (six) hours as needed.       Diet: routine diet  Activity: Advance as tolerated. Pelvic rest for 6 weeks.   Outpatient follow up:4 weeks Follow up Appt: Future Appointments  Date Time Provider Department Center  03/23/2017  9:30 AM Roe Coombsenney, Rachelle A, CNM [facility] None   Follow up Visit:No Follow-up on file.  Postpartum contraception: Condoms- strongly rec other options  Newborn Data: Live born female  Birth Weight: 8 lb 12.6 oz (3986 g) APGAR: 7,  9  Newborn Delivery   Birth date/time:  02/23/2017 12:23:00 Delivery type:  Vaginal, Spontaneous     Baby Feeding: Bottle and Breast Disposition:home with mother   02/25/2017 Cam HaiSHAW, KIMBERLY, CNM 4:40 AM

## 2017-02-25 NOTE — Lactation Note (Signed)
This note was copied from a baby's chart. Lactation Consultation Note  Patient Name: Debbie Gaetana Michaelisalen Hetland WUJWJ'XToday's Date: 02/25/2017  baby is 3847 hours old , for D/C today  LC reviewed and updated the doc flow sheets 1st time breastfeeding for this mom LC reviewed basics and answered all mom and dads breast feeding questions.  Per mom breast feeding is going well, and I really like the football position.  LC recommended to mom to get in the habit of switching between at least 2 positions.  Mom denies sore ness. Sore nipple and engorgement prevention and tx reviewed.  LC instructed mom on the use hand pump and increased flange to #27 for when milk  Comes in.  Per mom has a Spectra 2 DEBP.  Mom had questions about going back to work breast feeding and pumping.  LC reviewed and answered questions.  Mother informed of post-discharge support and given phone number to the lactation department, including services for phone call assistance; out-patient appointments; and breastfeeding support group. List of other breastfeeding resources in the community given in the handout. Encouraged mother to call for problems or concerns related to breastfeeding.   Maternal Data    Feeding Feeding Type: Breast Fed Length of feed: 10 min(per mom )  LATCH Score ( Latch score by the RN )  Latch: Grasps breast easily, tongue down, lips flanged, rhythmical sucking.  Audible Swallowing: A few with stimulation  Type of Nipple: Everted at rest and after stimulation  Comfort (Breast/Nipple): Soft / non-tender  Hold (Positioning): Assistance needed to correctly position infant at breast and maintain latch.  LATCH Score: 8  Interventions    Lactation Tools Discussed/Used     Consult Status      Debbie Mercer [facility] 02/25/2017, 11:29 AM

## 2017-02-28 ENCOUNTER — Inpatient Hospital Stay (HOSPITAL_COMMUNITY): Admission: RE | Admit: 2017-02-28 | Payer: BLUE CROSS/BLUE SHIELD | Source: Ambulatory Visit

## 2017-03-23 ENCOUNTER — Ambulatory Visit: Payer: Self-pay | Admitting: Certified Nurse Midwife

## 2017-03-28 ENCOUNTER — Encounter: Payer: Self-pay | Admitting: Certified Nurse Midwife

## 2017-03-28 ENCOUNTER — Ambulatory Visit (INDEPENDENT_AMBULATORY_CARE_PROVIDER_SITE_OTHER): Payer: BLUE CROSS/BLUE SHIELD | Admitting: Certified Nurse Midwife

## 2017-03-28 VITALS — BP 117/79 | HR 87 | Wt 197.0 lb

## 2017-03-28 DIAGNOSIS — Z1389 Encounter for screening for other disorder: Secondary | ICD-10-CM

## 2017-03-28 NOTE — Progress Notes (Signed)
Post Partum Exam  Debbie Mercer is a 22 y.o. 353P1011 female who presents for a postpartum visit. She is 5 weeks postpartum following a spontaneous vaginal delivery. I have fully reviewed the prenatal and intrapartum course. The delivery was at 40 gestational weeks.  Anesthesia: epidural. Postpartum course has been unremarkable. Baby's course has been unremarkable. Baby is feeding by breast. Bleeding per pt Bleeding comes and goes not heavy today. . Bowel function is normal. Bladder function is normal. Patient is not sexually active. Contraception method is condoms. Postpartum depression screening:neg EPDS: 1 CC: vaginal discharge and Hemorrhoids wants eval.   Patient left before being seen by the provider.

## 2017-04-10 ENCOUNTER — Ambulatory Visit (INDEPENDENT_AMBULATORY_CARE_PROVIDER_SITE_OTHER): Payer: BLUE CROSS/BLUE SHIELD | Admitting: Certified Nurse Midwife

## 2017-04-10 ENCOUNTER — Encounter: Payer: Self-pay | Admitting: Certified Nurse Midwife

## 2017-04-10 NOTE — Progress Notes (Signed)
Post Partum Exam  Debbie Mercer is a 22 y.o. 703P1011 female who presents for a postpartum visit. She is 6 weeks postpartum following a spontaneous vaginal delivery. I have fully reviewed the prenatal and intrapartum course. The delivery was at 40 gestational weeks.  Anesthesia: epidural. Postpartum course has been uncomplicated. Baby's course has been uncomplicated. Baby is feeding by both breast and bottle - Enfamil Neuro pro. Bleeding staining only. Bowel function is normal. Bladder function is normal. Patient is sexually active. Contraception method is none. Postpartum depression screening:neg  The following portions of the patient's history were reviewed and updated as appropriate: allergies, current medications, past family history, past medical history, past social history, past surgical history and problem list. Last pap smear done 07/28/16 and was Normal  Review of Systems Pertinent items noted in HPI and remainder of comprehensive ROS otherwise negative.    Objective:  currently breastfeeding.  General:  alert, cooperative and no distress   Breasts:  inspection negative, no nipple discharge or bleeding, no masses or nodularity palpable  Lungs: clear to auscultation bilaterally  Heart:  regular rate and rhythm, S1, S2 normal, no murmur, click, rub or gallop  Abdomen: soft, non-tender; bowel sounds normal; no masses,  no organomegaly  Pelvic/Rectal Exam: Not performed.        Assessment:    Normal 6 week postpartum exam. Pap smear not done at today's visit.   Plan:   1. Contraception: abstinence 2. Condoms encouraged.  Breast feeding status: excellent.  Return to work letter completed.  3. Follow up in: 6 months for annual exam or as needed.

## 2017-06-25 IMAGING — US US OB TRANSVAGINAL
1 series · 15 of 28 positions shown · non-contrast
Comparison: None.

CLINICAL DATA: Abdominal pain with positive pregnancy test.

EXAM:
OBSTETRIC <14 WK US AND TRANSVAGINAL OB US
TECHNIQUE: Both transabdominal and transvaginal ultrasound examinations were
performed for complete evaluation of the gestation as well as the
maternal uterus, adnexal regions, and pelvic cul-de-sac.
Transvaginal technique was performed to assess early pregnancy.

[Series 1: us ob transvaginal · 15 of 71 slices shown]
[im 1/71]
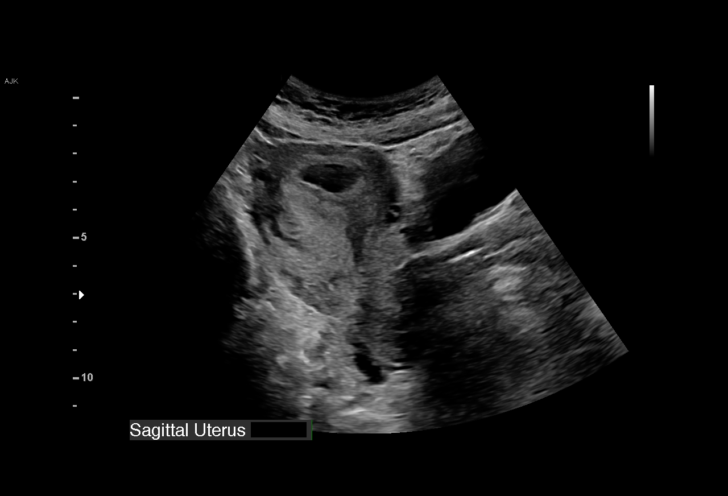
[im 6/71]
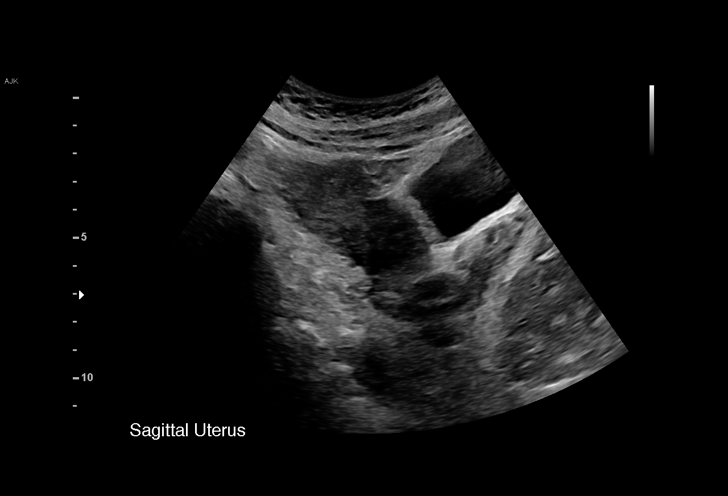
[im 11/71]
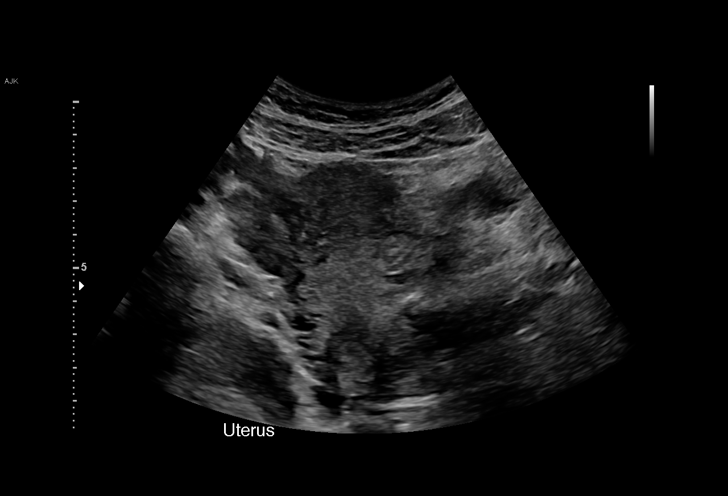
[im 16/71]
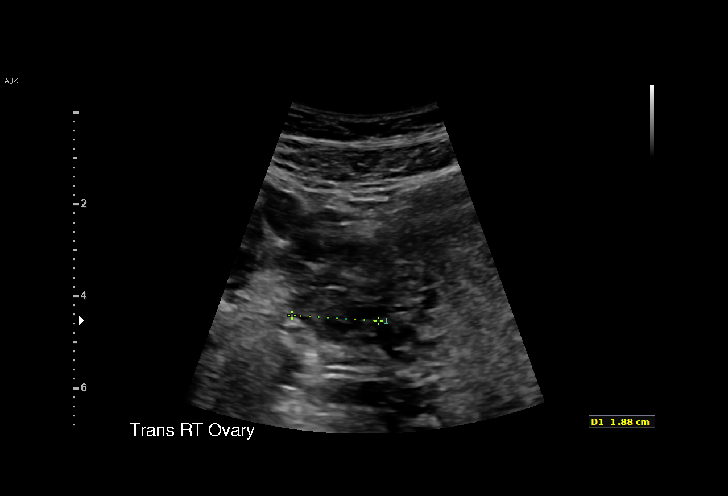
[im 21/71]
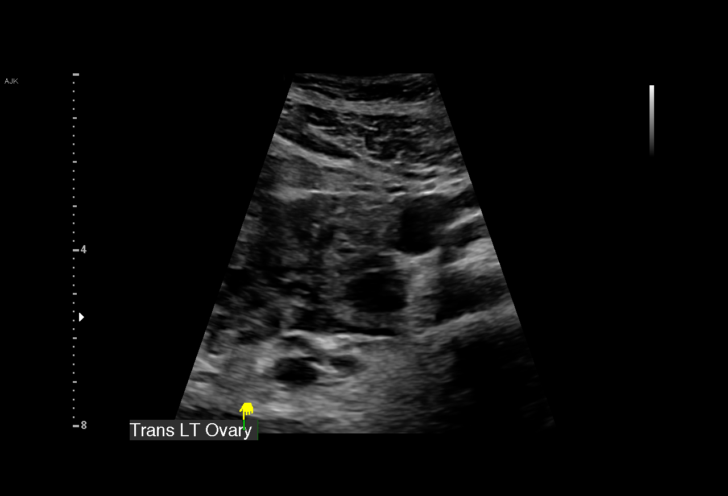
[im 26/71]
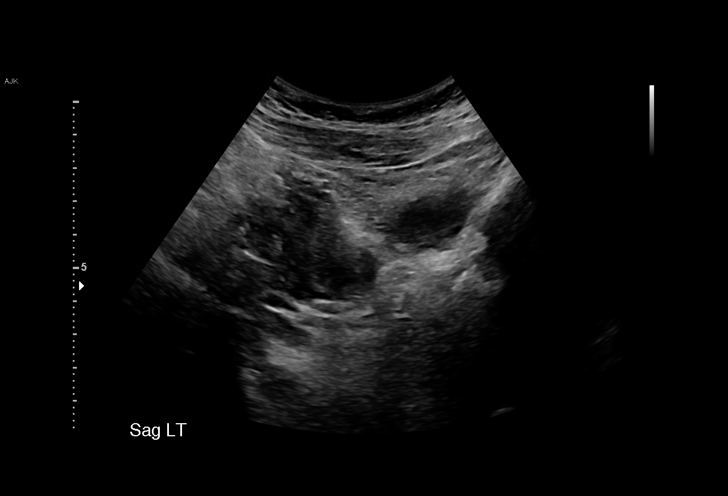
[im 32/71]
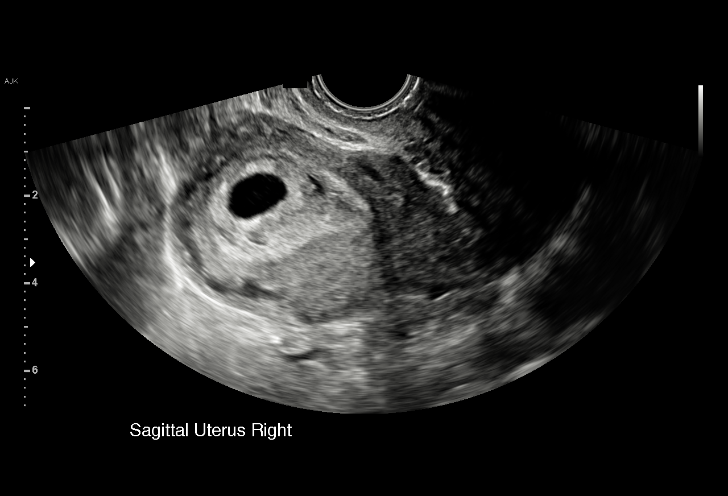
[im 37/71]
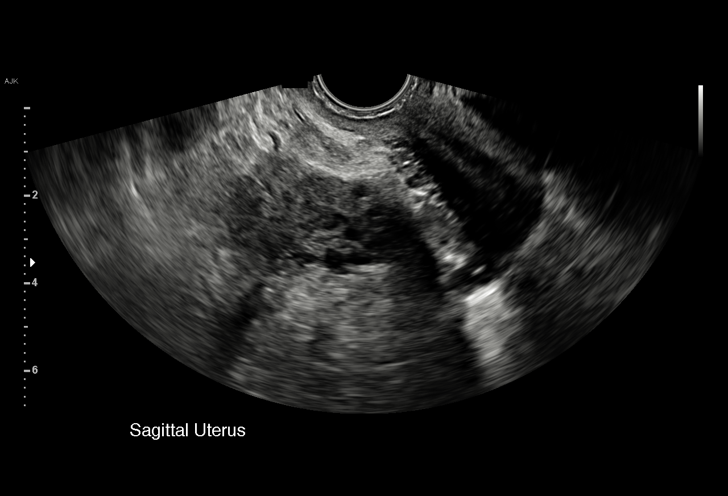
[im 39/71]
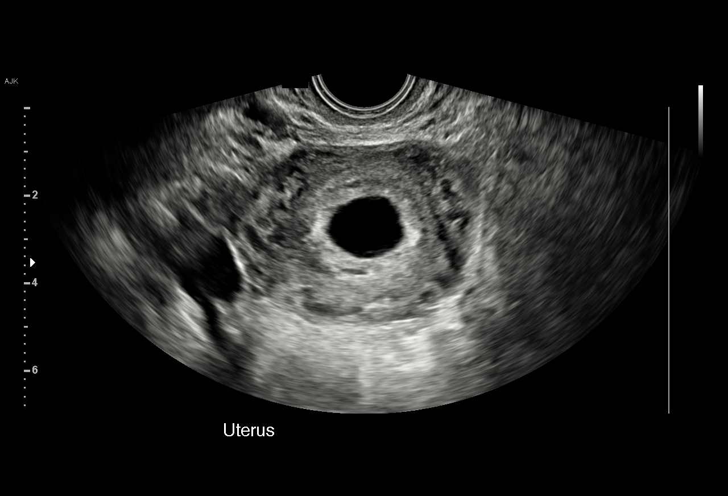
[im 45/71]
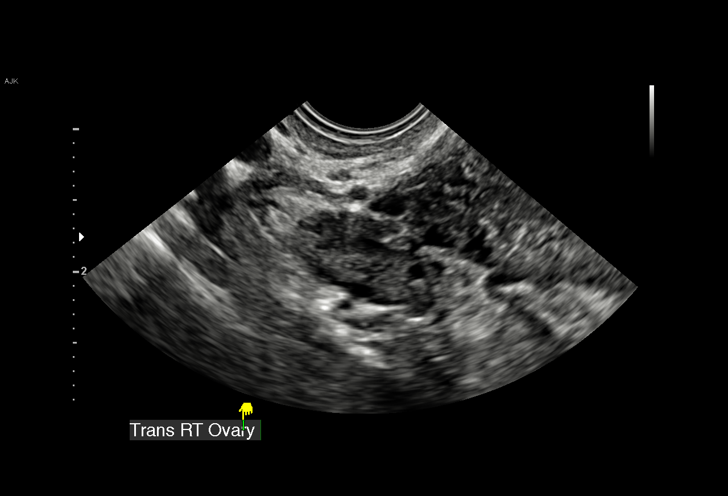
[im 50/71]
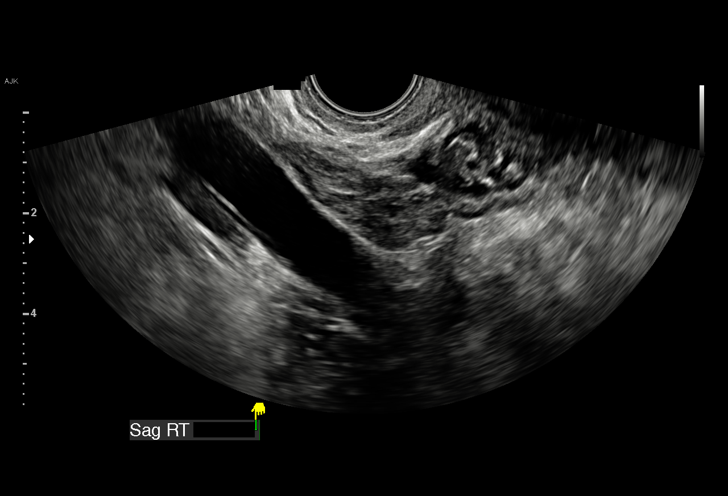
[im 55/71]
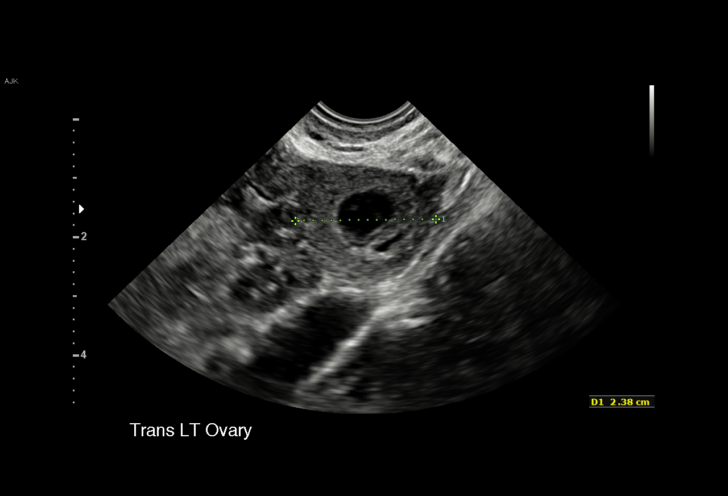
[im 60/71]
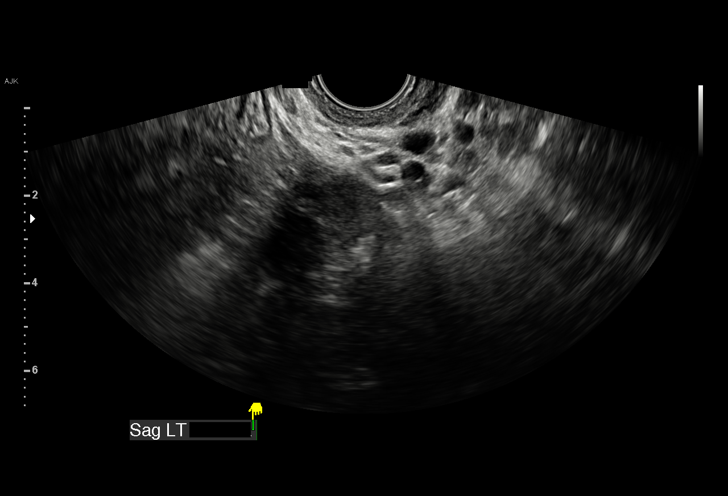
[im 65/71]
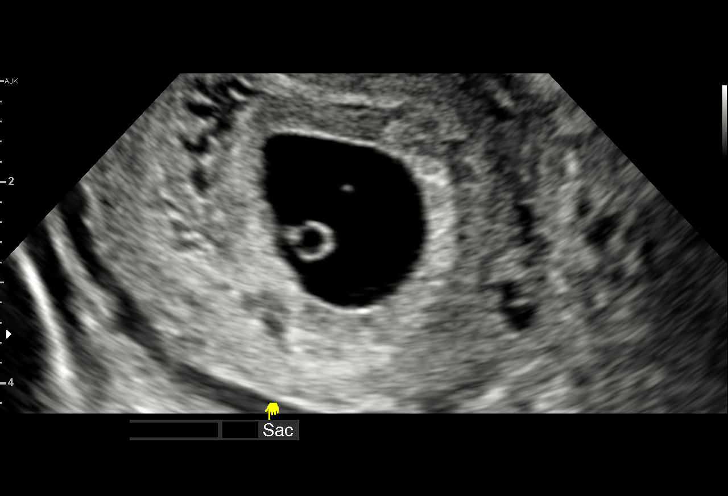
[im 71/71]
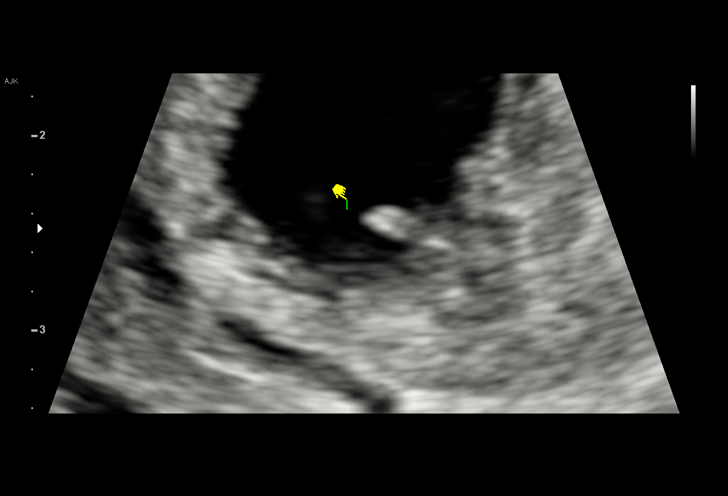

[15 of 28 positions shown; findings below may reference images not displayed]

FINDINGS: Intrauterine gestational sac: Single.

Yolk sac:  Visualized.

Embryo:  Visualized.

Cardiac Activity: Visualized.

Heart Rate: 180  bpm

CRL:  3  mm   5 w   6 d                  US EDC: 02/24/2017.

Subchorionic hemorrhage:  Tiny.

Maternal uterus/adnexae: Corpus luteum cyst identified left ovary.
No right adnexal mass. No substantial intraperitoneal free fluid.
IMPRESSION: Single living intrauterine gestation at estimated 5 week 6 day
gestational age by crown-rump length.

## 2017-08-07 IMAGING — US US MFM FETAL NUCHAL TRANSLUCENCY
1 series · 14 of 28 positions shown · non-contrast
Comparison: none

[Series 1: us mfm fetal nuchal translucency · 14 of 30 slices shown]
[im 2/30]
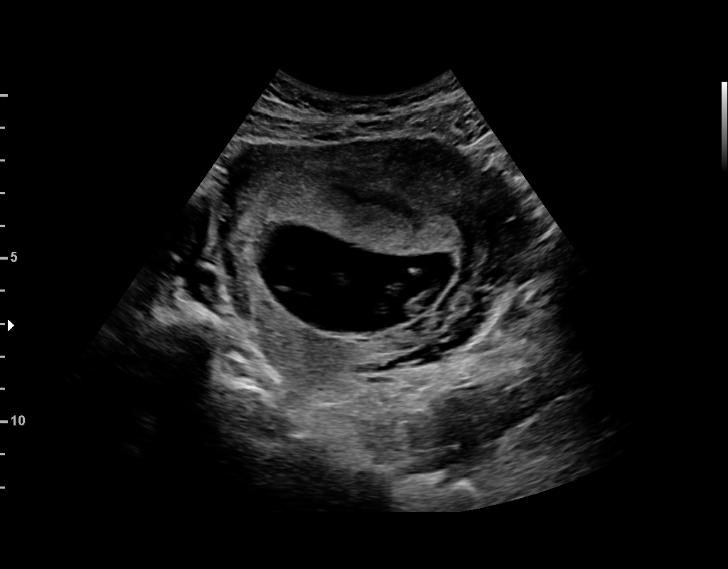
[im 4/30]
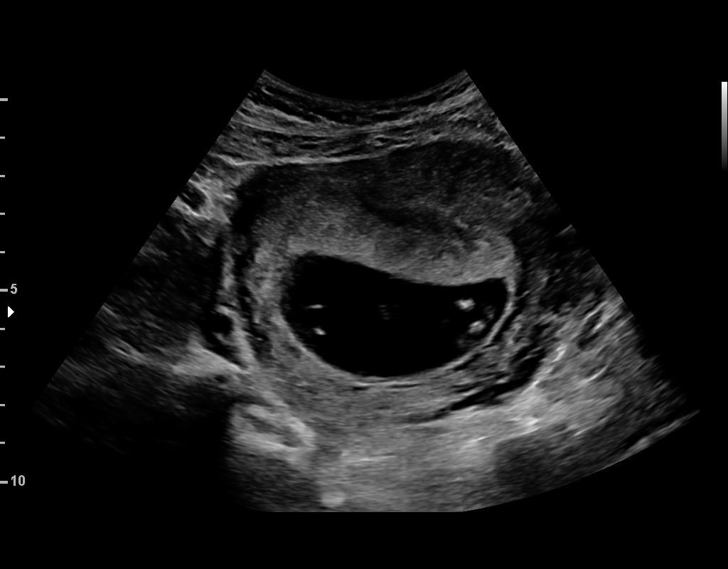
[im 6/30]
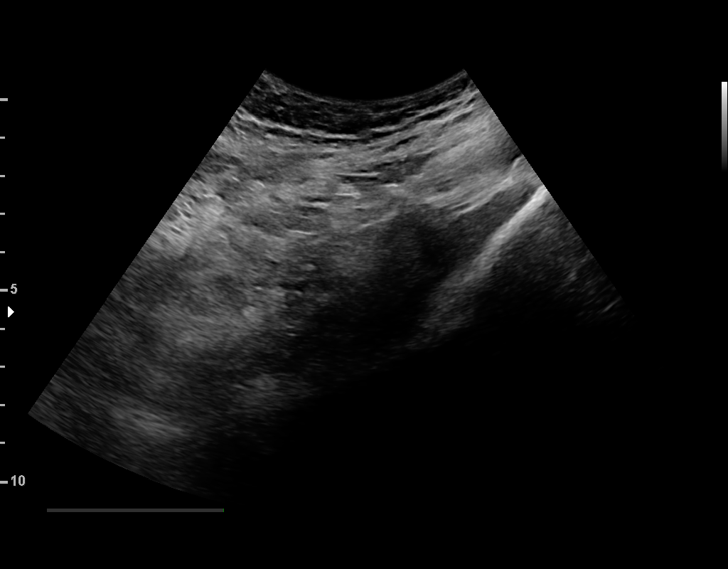
[im 8/30]
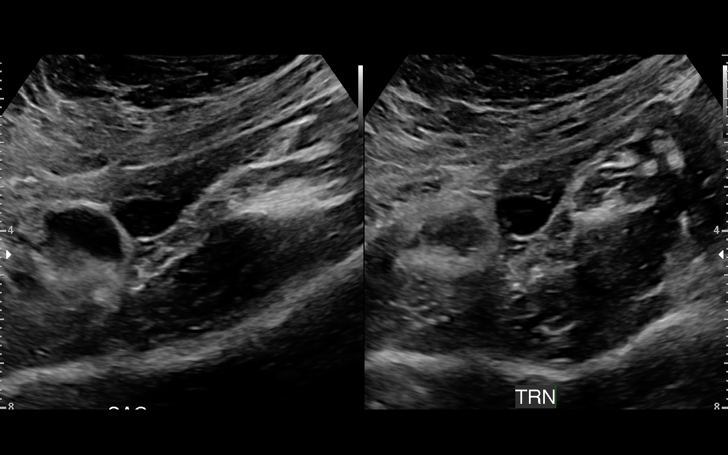
[im 10/30]
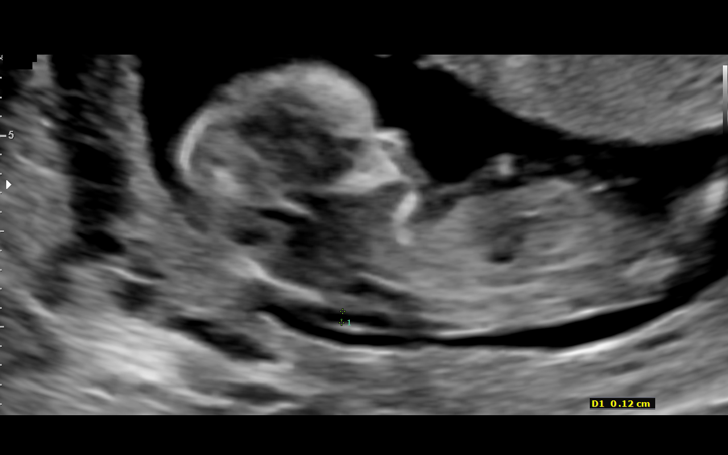
[im 12/30]
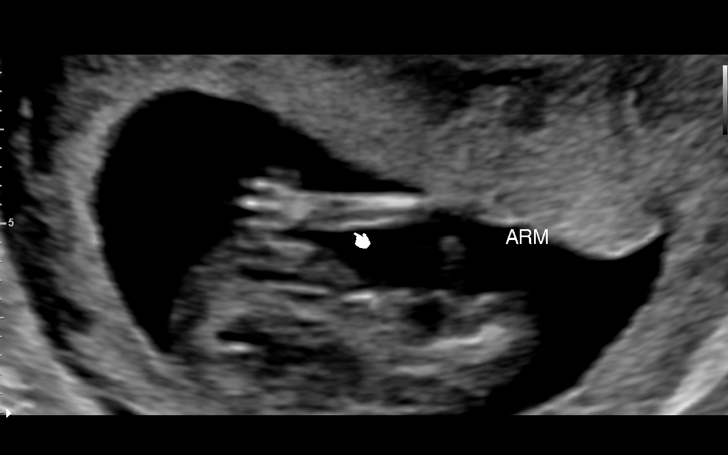
[im 14/30]
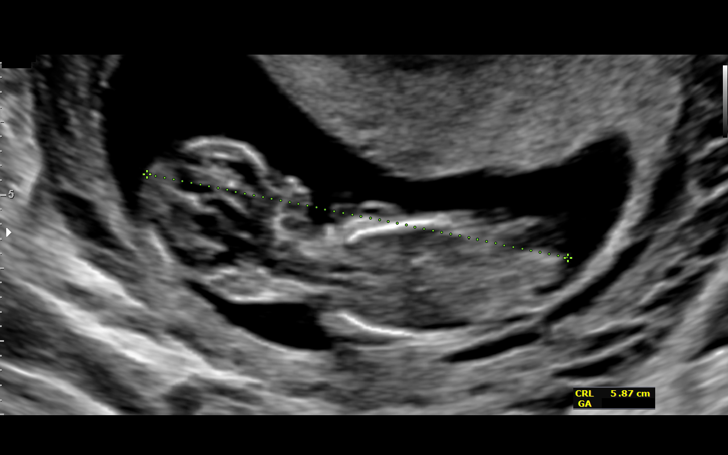
[im 17/30]
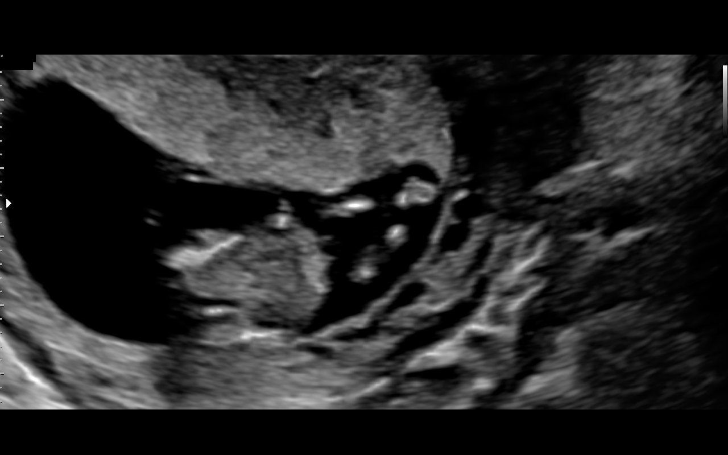
[im 19/30]
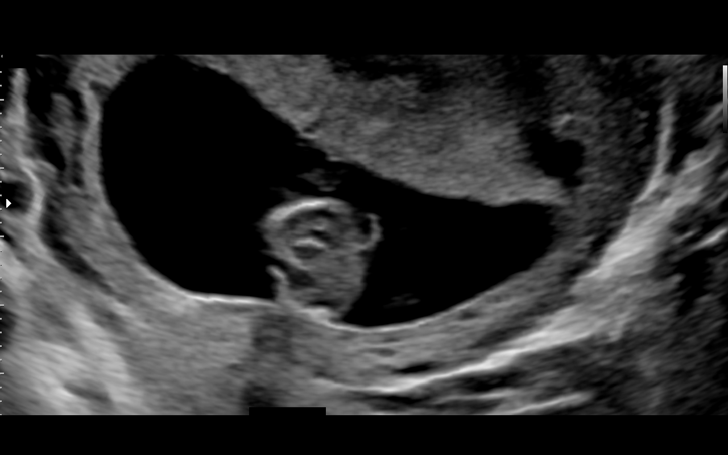
[im 21/30]
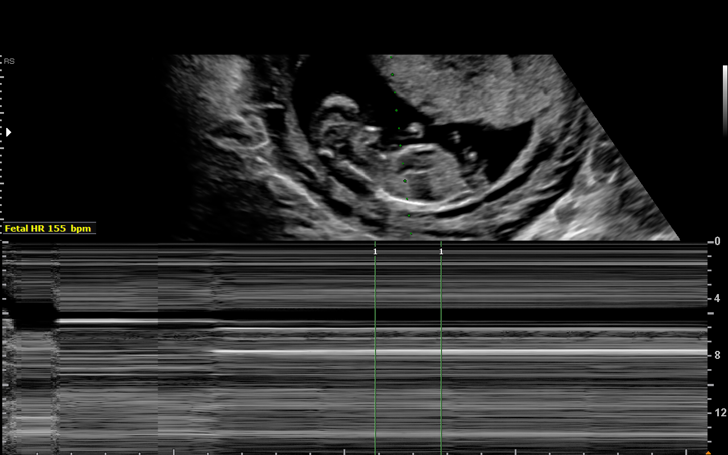
[im 23/30]
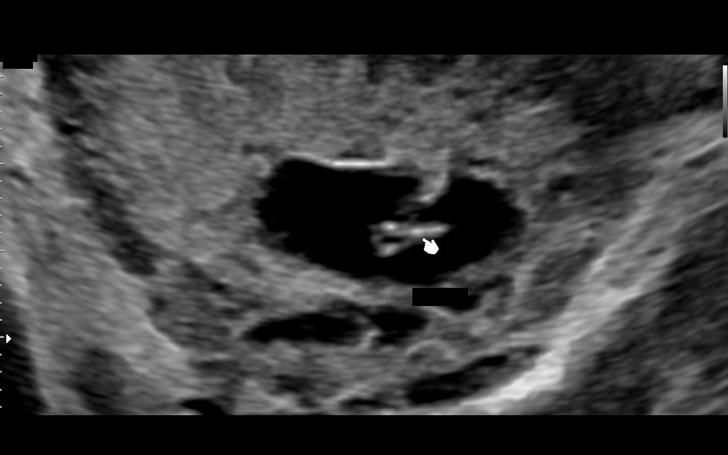
[im 25/30]
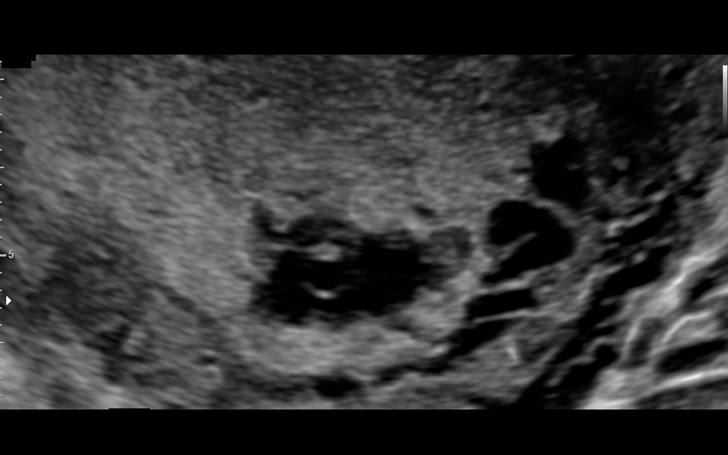
[im 27/30]
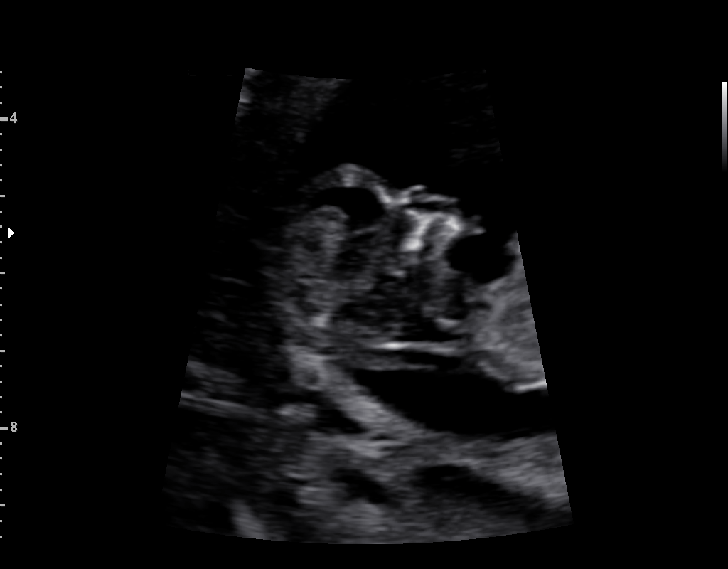
[im 30/30]
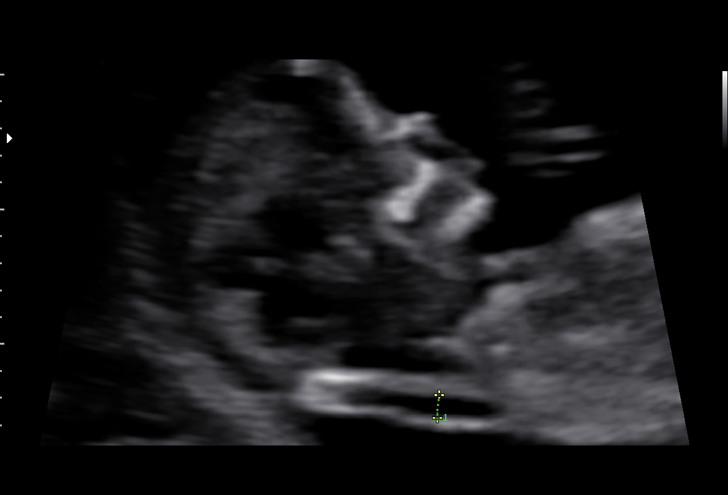

[14 of 28 positions shown; findings below may reference images not displayed]

OB/Gyn Clinic

TRANSLUCENCY

Indications

12 weeks gestation of pregnancy
Encounter for nuchal translucency
OB History

Gravidity:    3         Term:   1
Living:       1
Fetal Evaluation

Num Of Fetuses:     1
Fetal Heart         155
Rate(bpm):
Cardiac Activity:   Observed
Presentation:       Variable
Placenta:           Anterior

Amniotic Fluid
AFI FV:      Subjectively within normal limits

Largest Pocket(cm)
3
Gestational Age

LMP:           12w 4d       Date:   05/16/16                 EDD:   02/20/17
Best:          12w 4d    Det. By:   LMP  (05/16/16)          EDD:   02/20/17
1st Trimester Genetic Sonogram Screening

CRL:            58.4  mm    G. Age:   12w 1d                 EDD:   02/23/17
Nuc Trans:       1.6  mm
Nasal Bone:                 Present
Anatomy

Cranium:               Appears normal         Upper Extremities:      Visualized
Stomach:               Appears normal         Lower Extremities:      Visualized
Bladder:               Appears normal
Cervix Uterus Adnexa

Cervix
Normal appearance by transabdominal scan.

Uterus
No abnormality visualized.

Left Ovary
Not visualized.

Right Ovary
Within normal limits.

Adnexa:       No abnormality visualized. No adnexal mass
visualized.
Impression

SIUP at 12+4 weeks
No gross abnormalities identified
NT measurement was within normal limits for this GA; NB
present
Normal amniotic fluid volume
Measurements consistent with LMP dating
Recommendations

Offer MSAFP in the second trimester for ONTD screening
Offer anatomy U/S by 18 weeks

## 2017-08-22 ENCOUNTER — Telehealth: Payer: Self-pay

## 2017-08-22 ENCOUNTER — Other Ambulatory Visit: Payer: Self-pay

## 2017-08-22 MED ORDER — IBUPROFEN 600 MG PO TABS: 600.0000 mg | ORAL_TABLET | Freq: Four times a day (QID) | ORAL | 0 refills | Status: DC | PRN

## 2017-08-22 NOTE — Telephone Encounter (Signed)
Pt called stating she would like a refill of ibuprofen due to pain in her back from epidural during labor approx. 6 months ago. I advised pt I would send in the refill for her, but recommended that she be seen by her PCP for the back pain. Pt verbalized understanding.

## 2017-08-24 ENCOUNTER — Inpatient Hospital Stay (HOSPITAL_COMMUNITY)
Admission: AD | Admit: 2017-08-24 | Discharge: 2017-08-24 | Disposition: A | Discharge status: Home / Self Care | Payer: BLUE CROSS/BLUE SHIELD | Source: Ambulatory Visit | Attending: Family Medicine | Admitting: Family Medicine

## 2017-08-24 DIAGNOSIS — B379 Candidiasis, unspecified: Secondary | ICD-10-CM | POA: Diagnosis not present

## 2017-08-24 DIAGNOSIS — B373 Candidiasis of vulva and vagina: Secondary | ICD-10-CM | POA: Insufficient documentation

## 2017-08-24 DIAGNOSIS — N898 Other specified noninflammatory disorders of vagina: Secondary | ICD-10-CM | POA: Diagnosis present

## 2017-08-24 LAB — URINALYSIS, ROUTINE W REFLEX MICROSCOPIC
BILIRUBIN URINE: NEGATIVE
Glucose, UA: NEGATIVE mg/dL
KETONES UR: NEGATIVE mg/dL
Nitrite: NEGATIVE
PH: 6 (ref 5.0–8.0)
Protein, ur: 30 mg/dL — AB
SPECIFIC GRAVITY, URINE: 1.024 (ref 1.005–1.030)
WBC, UA: >50 WBC/hpf — ABNORMAL HIGH (ref 0–5)

## 2017-08-24 LAB — POCT PREGNANCY, URINE: PREG TEST UR: NEGATIVE

## 2017-08-24 LAB — WET PREP, GENITAL
CLUE CELLS WET PREP: NONE SEEN
SPERM: NONE SEEN
TRICH WET PREP: NONE SEEN

## 2017-08-24 MED ORDER — FLUCONAZOLE 150 MG PO TABS: 150.0000 mg | ORAL_TABLET | Freq: Once | ORAL | 1 refills | Status: AC

## 2017-08-24 MED ORDER — TERCONAZOLE 0.4 % VA CREA: 1.0000 | TOPICAL_CREAM | Freq: Every day | VAGINAL | 0 refills | Status: DC

## 2017-08-24 NOTE — MAU Provider Note (Signed)
Patient Debbie Mercer is a  22 y.o. [phone] here with  Complaints of brownish/whiteness discharge for two or three days plus itching for 5 days. She denies dysuria, NV. She occasionally has headaches but doesn't think its related to any ob-gyn issue. She is starting to introduce solid food as her child is 866 months old.  History     CSN: [id number]  Arrival date and time: 08/24/17 0850   None     Chief Complaint  Patient presents with  . Vaginal Discharge  . Vaginal Itching   Vaginal Discharge  The patient's primary symptoms include genital itching and vaginal discharge. This is a new problem. The current episode started in the past 7 days. The problem occurs constantly. The problem has been unchanged. Associated symptoms include headaches. Pertinent negatives include no dysuria, sore throat, urgency or vomiting. The vaginal discharge was white and copious. The vaginal bleeding is spotting. She has not been passing clots. She has not been passing tissue. Nothing aggravates the symptoms. She has tried nothing for the symptoms.  Vaginal Itching  The patient's primary symptoms include genital itching and vaginal discharge. Associated symptoms include headaches. Pertinent negatives include no dysuria, sore throat, urgency or vomiting.    OB History    Gravida  3   Para  2   Term  2   Preterm  0   AB  1   Living  2     SAB  1   TAB  0   Ectopic  0   Multiple  0   Live Births  2           Past Medical History:  Diagnosis Date  . Medical history non-contributory     Past Surgical History:  Procedure Laterality Date  . BREAST LUMPECTOMY      Family History  Problem Relation Age of Onset  . Hypertension Mother   . Diabetes Maternal Grandmother   . Anemia Maternal Grandmother     Social History   Tobacco Use  . Smoking status: Never Smoker  . Smokeless tobacco: Never Used  Substance Use Topics  . Alcohol use: No  . Drug use: Yes    Types: Marijuana   Comment: occasional but not recently    Allergies:  Allergies  Allergen Reactions  . Shellfish Allergy Anaphylaxis and Itching    Medications Prior to Admission  Medication Sig Dispense Refill Last Dose  . ibuprofen (ADVIL,MOTRIN) 600 MG tablet Take 1 tablet (600 mg total) by mouth every 6 (six) hours as needed. 30 tablet 0   . Prenatal-DSS-FeCb-FeGl-FA (CITRANATAL BLOOM) 90-1 MG TABS Take 1 tablet daily by mouth. 30 tablet 12 Taking    Review of Systems  HENT: Negative for sore throat.   Gastrointestinal: Negative for vomiting.  Genitourinary: Positive for vaginal discharge. Negative for dysuria and urgency.  Neurological: Positive for headaches.   Physical Exam   Blood pressure 118/65, pulse 93, temperature 98.9 F (37.2 C), resp. rate 16, height 5\' 4"  (1.626 m), weight 198 lb (89.8 kg), last menstrual period 08/07/2017, currently breastfeeding.  Physical Exam  Constitutional: She appears well-developed.  HENT:  Head: Normocephalic.  Neck: Normal range of motion.  GI: Soft.  Musculoskeletal: Normal range of motion.  Neurological: She is alert.  Skin: Skin is warm and dry.  Psychiatric: She has a normal mood and affect.    MAU Course  Procedures  MDM-gc chlamydia is pending -wet prep was positive for yeast  Assessment and Plan  1. Yeast infection    2. Patient stable for discharge with RX for Diflucan and Terazol.  3. Reviewed appropriate use of MAU; recommended that patient make appointment at [facility] for contraception counseling.     Charlesetta Garibaldi Kooistra 08/24/2017, 9:51 AM

## 2017-08-24 NOTE — Discharge Instructions (Signed)

## 2017-08-24 NOTE — MAU Note (Signed)
Pt presents to MAU with complaints of vaginal itching with a thick white discharge for three days. Denies any pain

## 2017-08-25 LAB — GC/CHLAMYDIA PROBE AMP (~~LOC~~) NOT AT ARMC
CHLAMYDIA, DNA PROBE: NEGATIVE
Neisseria Gonorrhea: NEGATIVE

## 2017-09-28 IMAGING — US US MFM OB COMP +14 WKS
1 series · 14 of 28 positions shown · non-contrast
Comparison: none

[Series 1: us mfm ob comp +14 wks · 14 of 101 slices shown]
[im 4/101]
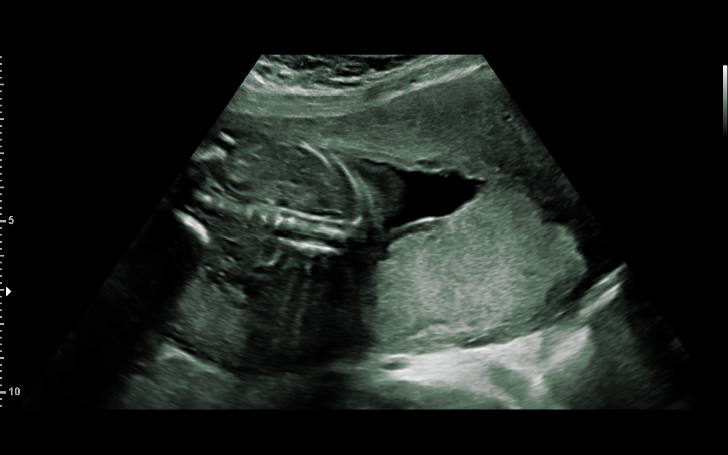
[im 12/101]
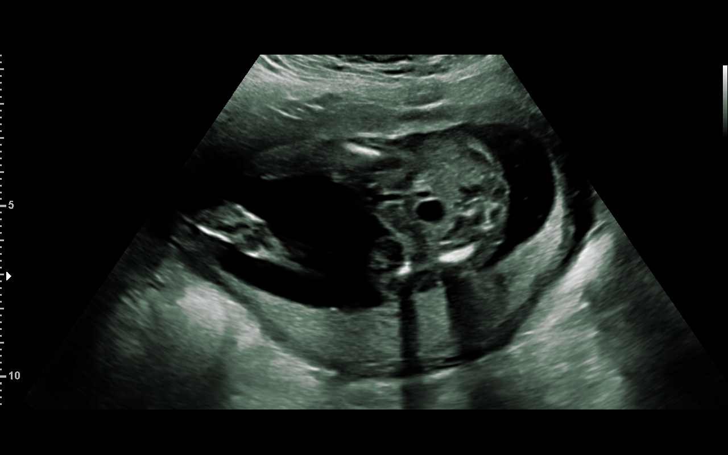
[im 19/101]
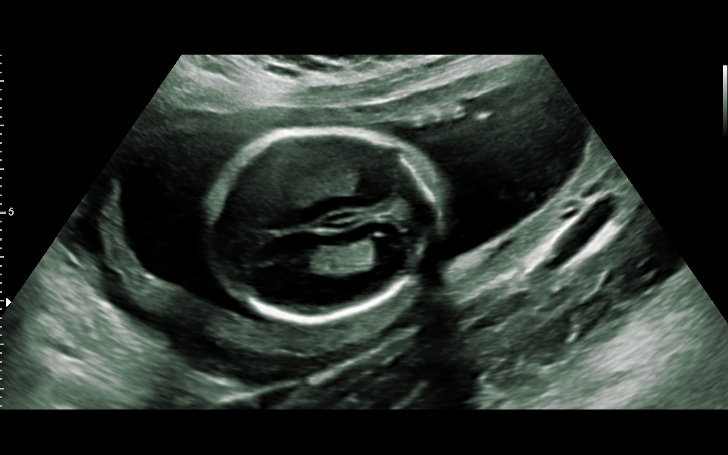
[im 26/101]
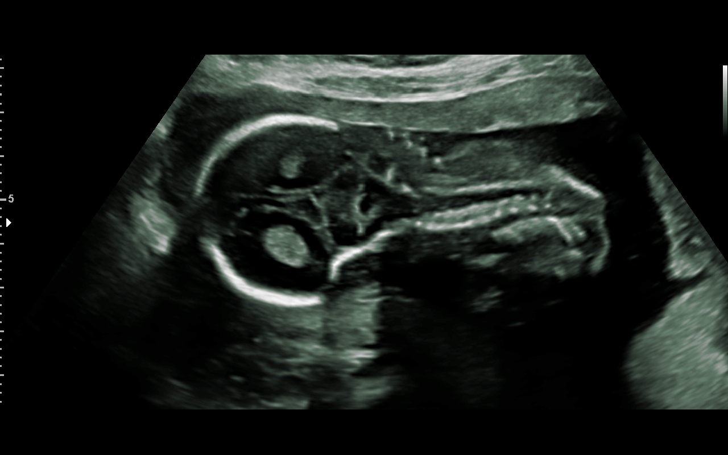
[im 34/101]
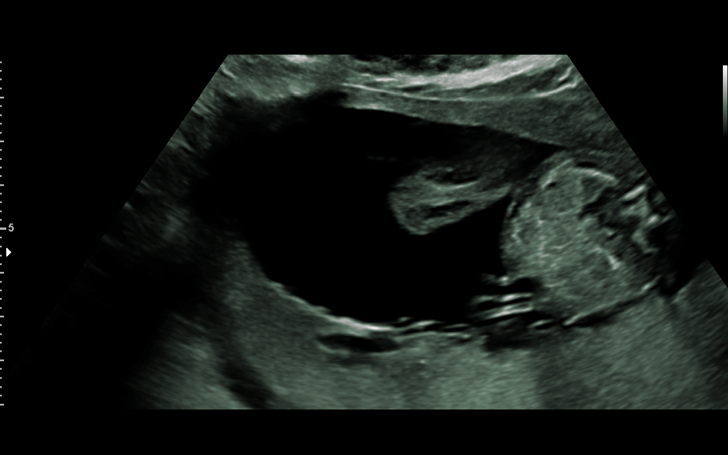
[im 41/101]
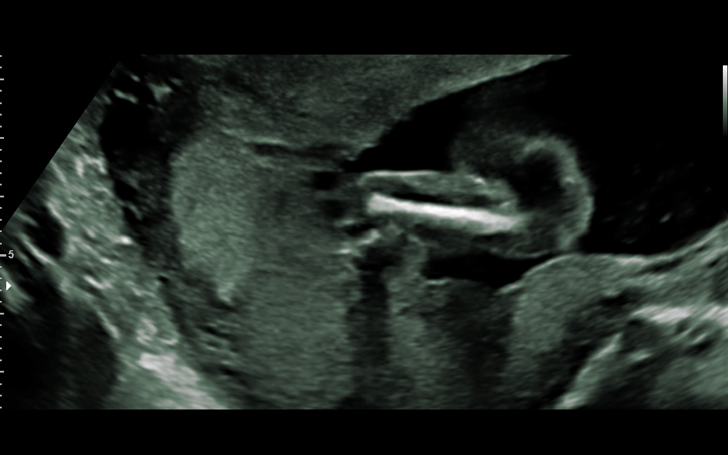
[im 49/101]
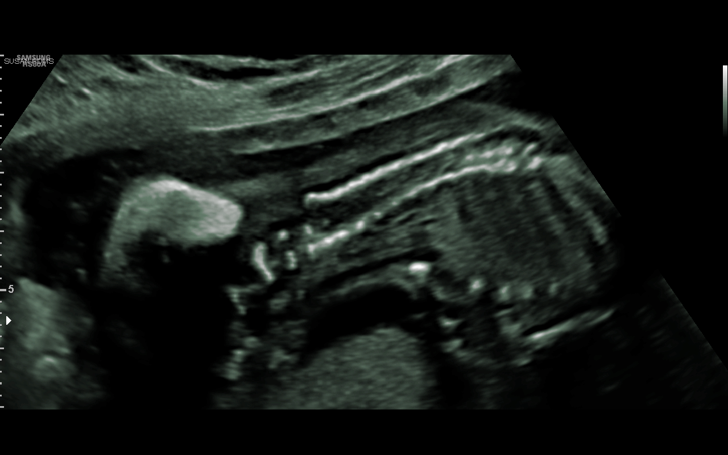
[im 56/101]
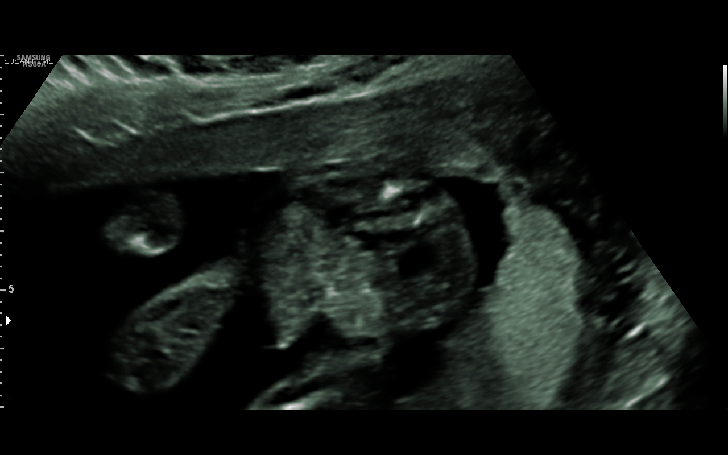
[im 63/101]
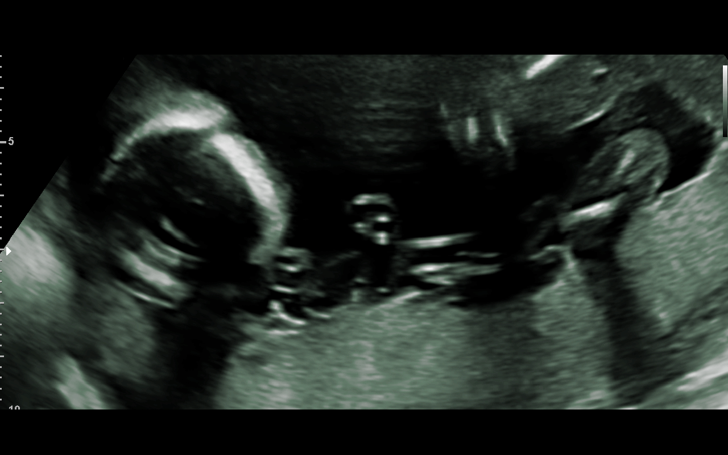
[im 71/101]
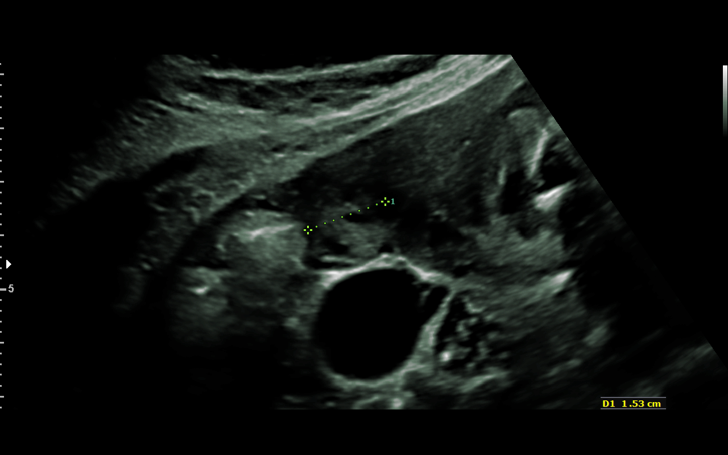
[im 78/101]
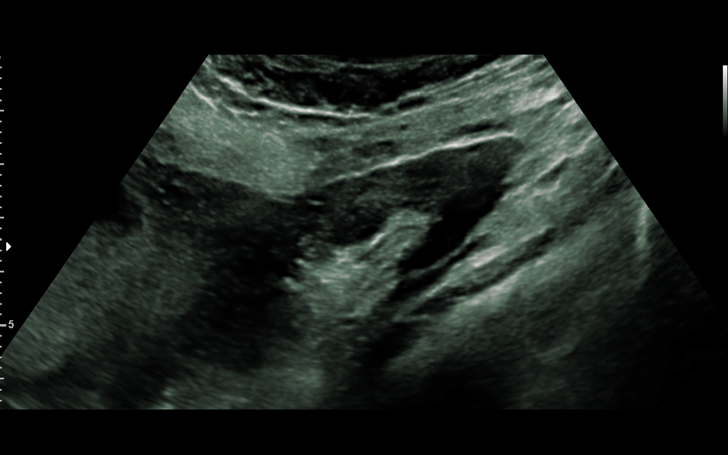
[im 86/101]
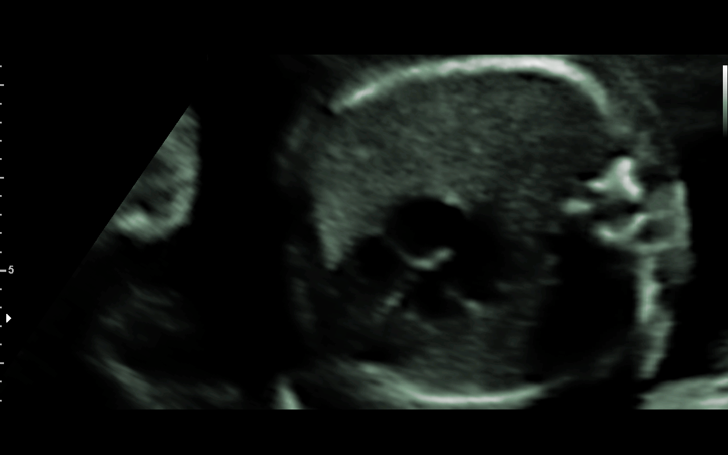
[im 93/101]
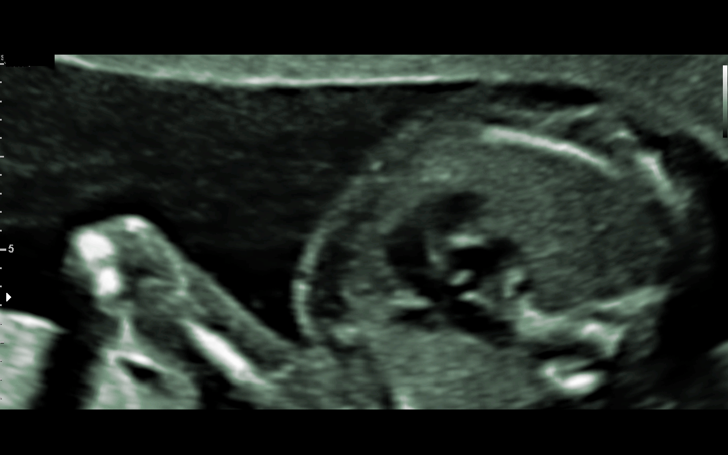
[im 101/101]
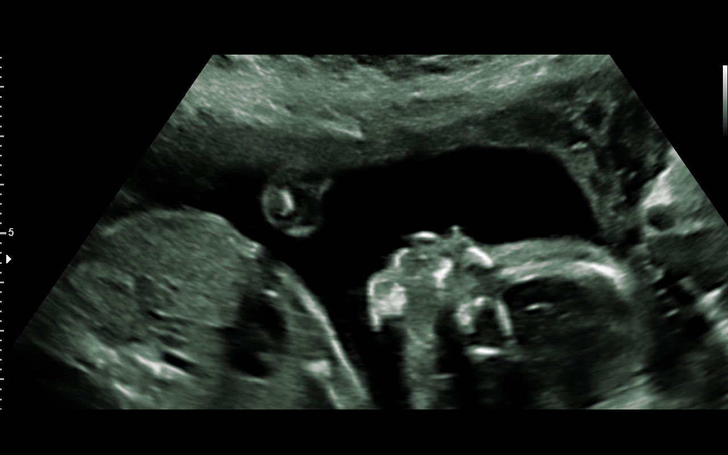

[14 of 28 positions shown; findings below may reference images not displayed]

OB/Gyn Clinic

Indications

20 weeks gestation of pregnancy
Encounter for antenatal screening for
malformations
OB History

Gravidity:    3         Term:   1
Living:       1
Fetal Evaluation

Num Of Fetuses:     1
Cardiac Activity:   Observed
Presentation:       Variable
Placenta:           Posterior, above cervical os
P. Cord Insertion:  Visualized

Amniotic Fluid
AFI FV:      Subjectively within normal limits

Largest Pocket(cm)
4.6
Biometry

BPD:      45.3  mm     G. Age:  19w 5d         37  %    CI:        70.15   %   70 - 86
FL/HC:      17.9   %   16.8 -
HC:      172.5  mm     G. Age:  19w 6d         34  %    HC/AC:      1.17       1.09 -
AC:      147.6  mm     G. Age:  20w 0d         46  %    FL/BPD:     68.2   %
FL:       30.9  mm     G. Age:  19w 4d         28  %    FL/AC:      20.9   %   20 - 24
HUM:      31.3  mm     G. Age:  20w 3d         65  %
CER:        19  mm     G. Age:  18w 4d          5  %

LV:        6.7  mm
CM:        4.6  mm

Est. FW:     315  gm    0 lb 11 oz      46  %
Gestational Age

LMP:           20w 0d       Date:   05/16/16                 EDD:   02/20/17
U/S Today:     19w 6d                                        EDD:   02/21/17
Best:          20w 0d    Det. By:   LMP  (05/16/16)          EDD:   02/20/17
Anatomy

Cranium:               Appears normal         Aortic Arch:            Appears normal
Cavum:                 Appears normal         Ductal Arch:            Not well visualized
Ventricles:            Appears normal         Diaphragm:              Appears normal
Choroid Plexus:        Appears normal         Stomach:                Appears normal, left
sided
Cerebellum:            Appears normal         Abdomen:                Appears normal
Posterior Fossa:       Appears normal         Abdominal Wall:         Appears nml (cord
insert, abd wall)
Nuchal Fold:           Not applicable (>20    Cord Vessels:           Appears normal (3
wks GA)                                        vessel cord)
Face:                  Appears normal         Kidneys:                Appear normal
(orbits and profile)
Lips:                  Appears normal         Bladder:                Appears normal
Thoracic:              Appears normal         Spine:                  Appears normal
Heart:                 Appears normal         Upper Extremities:      Appears normal
(4CH, axis, and
situs)
RVOT:                  Appears normal         Lower Extremities:      Appears normal
LVOT:                  Appears normal

Other:  Male gender. Technically difficult due to fetal position. Heels
visualized. Nasal bone visualized.
Cervix Uterus Adnexa

Cervix
Length:            4.1  cm.
Normal appearance by transabdominal scan.

Left Ovary
Within normal limits.

Right Ovary
Within normal limits.
Impression

Singleton intrauterine pregnancy at 20+0 weeks. here for
anatomic survey
Review of the anatomy shows no sonographic markers for
aneuploidy or structural anomalies
However, ductal arch evaluation should be considered
suboptimal secondary to fetal position
Amniotic fluid volume is normal
Estimated fetal weight is 315g which is growth in the 46th
percentile
Recommendations

Recommend repeat evaluation in 4 weeks to confirm normal
cardiac anatomy

## 2017-10-17 ENCOUNTER — Other Ambulatory Visit: Payer: Self-pay

## 2017-10-17 ENCOUNTER — Ambulatory Visit (HOSPITAL_COMMUNITY)
Admission: EM | Admit: 2017-10-17 | Discharge: 2017-10-17 | Disposition: A | Discharge status: Home / Self Care | Payer: BLUE CROSS/BLUE SHIELD | Attending: Family Medicine | Admitting: Family Medicine

## 2017-10-17 DIAGNOSIS — L299 Pruritus, unspecified: Secondary | ICD-10-CM

## 2017-10-17 MED ORDER — PREDNISONE 10 MG (21) PO TBPK
ORAL_TABLET | Freq: Every day | ORAL | 0 refills | Status: DC
Start: 2017-10-17 — End: 2017-12-09

## 2017-10-17 MED ORDER — PERMETHRIN 5 % EX CREA: TOPICAL_CREAM | CUTANEOUS | 1 refills | Status: DC

## 2017-10-17 NOTE — ED Triage Notes (Signed)
Pt states she has hand pain ( both hands )  Pt states she hands are burning, aching pain and swelling. X 2 weeks.

## 2017-10-17 NOTE — Discharge Instructions (Signed)
If you want to be cautious with using Permethrin while breastfeeding you may pump and dump for 48 hours.

## 2017-10-26 NOTE — ED Provider Notes (Signed)
[facility] CARE CENTER   [id number] 10/17/17 Arrival Time: 1507  ASSESSMENT & PLAN:  1. Itching   Unsure of exact etiology.  Meds ordered this encounter  Medications  . predniSONE (STERAPRED UNI-PAK 21 TAB) 10 MG (21) TBPK tablet    Sig: Take by mouth daily. Take as directed.    Dispense:  21 tablet    Refill:  0  . permethrin (ELIMITE) 5 % cream    Sig: Apply from neck down before bed then wash off in the morning. May repeat in one week.    Dispense:  60 g    Refill:  1   Will follow up with PCP or here if worsening or failing to improve as anticipated. Reviewed expectations re: course of current medical issues. Questions answered. Outlined signs and symptoms indicating need for more acute intervention. Patient verbalized understanding. After Visit Summary given.   SUBJECTIVE:  Debbie Mercer is a 22 y.o. female who reports that she has been experiencing generalized itching over the past 2-3 weeks. Both hands "feel like they're burning sometimes." Occasional aching of hands. Questions transient swelling. No injuries. No new skin exposures. Afebrile. No recent illnesses/travel. No new medications. No specific aggravating or alleviating factors reported. No known trigger. No h/o similar.   ROS: As per HPI.  OBJECTIVE: Vitals:   10/17/17 1559 10/17/17 1602  BP:  117/79  Pulse:  95  Resp:  18  Temp:  98.9 F (37.2 C)  TempSrc:  Oral  SpO2:  100%  Weight: 88.5 kg     General appearance: alert; no distress Lungs: clear to auscultation bilaterally Heart: regular rate and rhythm Extremities: no edema; her hands appear normal; warm; normal capillary refill and sensation and grip Skin: warm and dry; signs of infection: no; no obvious skin lesions or rashes appreciated Psychological: alert and cooperative; normal mood and affect  Allergies  Allergen Reactions  . Shellfish Allergy Anaphylaxis and Itching    Past Medical History:  Diagnosis Date  . Medical history  non-contributory    Social History   Socioeconomic History  . Marital status: Single    Spouse name: Not on file  . Number of children: Not on file  . Years of education: Not on file  . Highest education level: Not on file  Occupational History  . Not on file  Social Needs  . Financial resource strain: Not on file  . Food insecurity:    Worry: Not on file    Inability: Not on file  . Transportation needs:    Medical: Not on file    Non-medical: Not on file  Tobacco Use  . Smoking status: Never Smoker  . Smokeless tobacco: Never Used  Substance and Sexual Activity  . Alcohol use: No  . Drug use: Yes    Types: Marijuana    Comment: occasional but not recently  . Sexual activity: Yes    Partners: Male  Lifestyle  . Physical activity:    Days per week: Not on file    Minutes per session: Not on file  . Stress: Not on file  Relationships  . Social connections:    Talks on phone: Not on file    Gets together: Not on file    Attends religious service: Not on file    Active member of club or organization: Not on file    Attends meetings of clubs or organizations: Not on file    Relationship status: Not on file  . Intimate partner violence:  Fear of current or ex partner: Not on file    Emotionally abused: Not on file    Physically abused: Not on file    Forced sexual activity: Not on file  Other Topics Concern  . Not on file  Social History Narrative  . Not on file   Family History  Problem Relation Age of Onset  . Hypertension Mother   . Diabetes Maternal Grandmother   . Anemia Maternal Grandmother    Past Surgical History:  Procedure Laterality Date  . BREAST LUMPECTOMY       Mardella Layman, MD 10/26/17 [phone]

## 2017-11-09 ENCOUNTER — Encounter (HOSPITAL_COMMUNITY): Payer: Self-pay | Admitting: Emergency Medicine

## 2017-11-09 ENCOUNTER — Emergency Department (HOSPITAL_COMMUNITY)
Admission: EM | Admit: 2017-11-09 | Discharge: 2017-11-09 | Disposition: A | Discharge status: Home / Self Care | Payer: BLUE CROSS/BLUE SHIELD | Attending: Emergency Medicine | Admitting: Emergency Medicine

## 2017-11-09 DIAGNOSIS — T7840XA Allergy, unspecified, initial encounter: Secondary | ICD-10-CM | POA: Diagnosis not present

## 2017-11-09 MED ORDER — LORATADINE 10 MG PO TABS
10.0000 mg | ORAL_TABLET | Freq: Once | ORAL | Status: AC
  Administered 2017-11-09: 10 mg via ORAL
  Filled 2017-11-09: qty 1

## 2017-11-09 MED ORDER — FAMOTIDINE 20 MG PO TABS: 20.0000 mg | ORAL_TABLET | Freq: Two times a day (BID) | ORAL | 0 refills | Status: DC

## 2017-11-09 MED ORDER — PREDNISONE 20 MG PO TABS
60.0000 mg | ORAL_TABLET | Freq: Once | ORAL | Status: AC
Start: 2017-11-09 — End: 2017-11-09
  Administered 2017-11-09: 60 mg via ORAL
  Filled 2017-11-09: qty 3

## 2017-11-09 MED ORDER — PREDNISONE 20 MG PO TABS: ORAL_TABLET | ORAL | 0 refills | Status: DC

## 2017-11-09 MED ORDER — FAMOTIDINE 20 MG PO TABS
20.0000 mg | ORAL_TABLET | Freq: Once | ORAL | Status: AC
  Administered 2017-11-09: 20 mg via ORAL
  Filled 2017-11-09: qty 1

## 2017-11-09 NOTE — ED Notes (Signed)
Patient verbalizes understanding of discharge instructions. Opportunity for questioning and answers were provided. Armband removed by staff, pt discharged from ED ambulatory by self\  

## 2017-11-09 NOTE — ED Provider Notes (Addendum)
[facility] EMERGENCY DEPARTMENT Provider Note   CSN: [id number] Arrival date & time: 11/09/17  0259     History   Chief Complaint Chief Complaint  Patient presents with  . Urticaria    Lip Swelling    HPI Debbie Mercer is a 22 y.o. female.  The history is provided by the patient.  Urticaria  This is a chronic problem. The current episode started more than 1 week ago (2 months waxing and waning). The problem occurs constantly. The problem has not changed since onset.Pertinent negatives include no chest pain, no abdominal pain, no headaches and no shortness of breath. Nothing aggravates the symptoms. Nothing relieves the symptoms. She has tried nothing for the symptoms. The treatment provided no relief.  Has had these symptoms multiple times in the past and was given a cream at urgent care but it did not help but symptoms went away on own.     Past Medical History:  Diagnosis Date  . Medical history non-contributory     Patient Active Problem List   Diagnosis Date Noted  . Mild tetrahydrocannabinol (THC) abuse 08/25/2016  . 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 Vitamin D deficiency 08/02/2016  . History of acute pyelonephritis 07/28/2016  . 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 GERD (gastroesophageal reflux disease) 03/05/2013    Past Surgical History:  Procedure Laterality Date  . BREAST LUMPECTOMY       OB History    Gravida  3   Para  2   Term  2   Preterm  0   AB  1   Living  2     SAB  1   TAB  0   Ectopic  0   Multiple  0   Live Births  2            Home Medications    Prior to Admission medications   Medication Sig Start Date End Date Taking? Authorizing Provider  ibuprofen (ADVIL,MOTRIN) 600 MG tablet Take 1 tablet (600 mg total) by mouth every 6 (six) hours as needed. 08/22/17   Brock Bad, MD  permethrin (ELIMITE) 5 %  cream Apply from neck down before bed then wash off in the morning. May repeat in one week. 10/17/17   Mardella Layman, MD  predniSONE (STERAPRED UNI-PAK 21 TAB) 10 MG (21) TBPK tablet Take by mouth daily. Take as directed. 10/17/17   Mardella Layman, MD  Prenatal-DSS-FeCb-FeGl-FA [facility] BLOOM) 90-1 MG TABS Take 1 tablet daily by mouth. 12/08/16   Denney, Rachelle A, CNM  terconazole (TERAZOL 7) 0.4 % vaginal cream Place 1 applicator vaginally at bedtime. 08/24/17   Marylene Land, CNM    Family History Family History  Problem Relation Age of Onset  . Hypertension Mother   . Diabetes Maternal Grandmother   . Anemia Maternal Grandmother     Social History Social History   Tobacco Use  . Smoking status: Never Smoker  . Smokeless tobacco: Never Used  Substance Use Topics  . Alcohol use: No  . Drug use: Yes    Types: Marijuana    Comment: occasional but not recently     Allergies   Shellfish allergy   Review of Systems Review of Systems  HENT: Negative for drooling, trouble swallowing and voice change.   Respiratory: Negative for shortness of breath, wheezing and stridor.   Cardiovascular: Negative for chest pain.  Gastrointestinal:  Negative for abdominal pain.  Musculoskeletal: Positive for joint swelling. Negative for neck pain and neck stiffness.  Skin: Positive for rash.  Neurological: Negative for headaches.  All other systems reviewed and are negative.    Physical Exam Updated Vital Signs BP 122/70 (BP Location: Right Arm)   Pulse 76   Temp 98.7 F (37.1 C)   Resp 16   LMP 11/08/2017   SpO2 100%   Physical Exam  Constitutional: She is oriented to person, place, and time. She appears well-developed and well-nourished. No distress.  HENT:  Head: Normocephalic and atraumatic.  Right Ear: External ear normal.  Left Ear: External ear normal.  Nose: Nose normal.  Mouth/Throat: Oropharynx is clear and moist. No oropharyngeal exudate.  Patient states  lips are swollen, no tenderness.  Not apparent on exam no swelling of the tongue or uvula  Eyes: Pupils are equal, round, and reactive to light. Conjunctivae are normal.  Neck: Normal range of motion. Neck supple.  Cardiovascular: Normal rate, regular rhythm, normal heart sounds and intact distal pulses.  Pulmonary/Chest: Effort normal and breath sounds normal. No stridor. She has no wheezes. She has no rales.  Abdominal: Soft. Bowel sounds are normal. She exhibits no mass. There is no tenderness. There is no rebound and no guarding.  Musculoskeletal:       Hands: Neurological: She is alert and oriented to person, place, and time. She displays normal reflexes.  Skin: Skin is warm and dry. Capillary refill takes less than 2 seconds.  Palms are erythematous  Psychiatric: She has a normal mood and affect.     ED Treatments / Results  Labs (all labs ordered are listed, but only abnormal results are displayed) Labs Reviewed - No data to display  EKG None  Radiology No results found.  Procedures Procedures (including critical care time)  Medications Ordered in ED Medications  predniSONE (DELTASONE) tablet 60 mg (has no administration in time range)  famotidine (PEPCID) tablet 20 mg (has no administration in time range)  loratadine (CLARITIN) tablet 10 mg (has no administration in time range)     Patient may have a rheumatologic process. Will need to follow up with PMD for rheumatologic testing.  Will treat lips and red palms with medication for allergic reaction.  Check your soaps and cosmetics for any changes.    Final Clinical Impressions(s) / ED Diagnoses   Return for weakness, numbness, changes in vision or speech, fevers >100.4 unrelieved by medication, shortness of breath, intractable vomiting, or diarrhea, abdominal pain, Inability to tolerate liquids or food, cough, altered mental status or any concerns. No signs of systemic illness or infection. The patient is  nontoxic-appearing on exam and vital signs are within normal limits.    I have reviewed the triage vital signs and the nursing notes. Pertinent labs &imaging results that were available during my care of the patient were reviewed by me and considered in my medical decision making (see chart for details).  After history, exam, and medical workup I feel the patient has been appropriately medically screened and is safe for discharge home. Pertinent diagnoses were discussed with the patient. Patient was given return precautions.      Tesla Keeler, MD 11/09/17 1610    Cy Blamer, MD 11/09/17 [id number]    Cy Blamer, MD 11/09/17 [id number]

## 2017-11-09 NOTE — ED Triage Notes (Signed)
Patient reports chronic itchy skin rashes/hives for 2 months and upper/lower lip swelling this evening , respirations unlabored / airway intact .

## 2017-11-16 ENCOUNTER — Encounter (HOSPITAL_COMMUNITY): Payer: Self-pay | Admitting: Emergency Medicine

## 2017-11-16 ENCOUNTER — Ambulatory Visit (HOSPITAL_COMMUNITY)
Admission: EM | Admit: 2017-11-16 | Discharge: 2017-11-16 | Disposition: A | Discharge status: Home / Self Care | Payer: BLUE CROSS/BLUE SHIELD

## 2017-11-16 DIAGNOSIS — K130 Diseases of lips: Secondary | ICD-10-CM

## 2017-11-16 DIAGNOSIS — R220 Localized swelling, mass and lump, head: Secondary | ICD-10-CM

## 2017-11-16 DIAGNOSIS — T7840XA Allergy, unspecified, initial encounter: Secondary | ICD-10-CM | POA: Diagnosis not present

## 2017-11-16 NOTE — ED Provider Notes (Signed)
[facility]    CSN: [id number] Arrival date & time: 11/16/17  0935     History   Chief Complaint Chief Complaint  Patient presents with  . Oral Swelling    HPI Debbie Mercer is a 22 y.o. female.   Pt is a 22 year old female that presents for recurrent hives, itching and oral swelling. This has been waxing and waning over the past month since having some dental work done. She has been seen multiple times for this and given 2 prescriptions for prednisone which she has not taken. She has been taking benadryl and allegra. Usually the urticaria starts every Wednesday and then the oral swelling will start the next day. She has also had some joint pain, erythema and swelling with it in the palmer aspect of her hands.  She is not having any trouble breathing or swallowing. No tongue swelling.  Denies any fever . Denies any recent changes in lotions, detergents, foods or other possible irritants. No recent travel. Nobody else at home has the rash. Patient has been outside but denies any contact with plants or insects. No new foods or medications.    ROS per HPI       Past Medical History:  Diagnosis Date  . Medical history non-contributory     Patient Active Problem List   Diagnosis Date Noted  . Mild tetrahydrocannabinol (THC) abuse 08/25/2016  . 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 Vitamin D deficiency 08/02/2016  . History of acute pyelonephritis 07/28/2016  . 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 GERD (gastroesophageal reflux disease) 03/05/2013    Past Surgical History:  Procedure Laterality Date  . BREAST LUMPECTOMY      OB History    Gravida  3   Para  2   Term  2   Preterm  0   AB  1   Living  2     SAB  1   TAB  0   Ectopic  0   Multiple  0   Live Births  2            Home Medications    Prior to Admission medications   Medication Sig  Start Date End Date Taking? Authorizing Provider  famotidine (PEPCID) 20 MG tablet Take 1 tablet (20 mg total) by mouth 2 (two) times daily. Patient not taking: Reported on 11/16/2017 11/09/17   Palumbo, April, MD  ibuprofen (ADVIL,MOTRIN) 200 MG tablet Take 200 mg by mouth every 6 (six) hours as needed for mild pain.    [provider]  ibuprofen (ADVIL,MOTRIN) 600 MG tablet Take 1 tablet (600 mg total) by mouth every 6 (six) hours as needed. Patient not taking: Reported on 11/09/2017 08/22/17   Brock Bad, MD  permethrin (ELIMITE) 5 % cream Apply from neck down before bed then wash off in the morning. May repeat in one week. Patient not taking: Reported on 11/09/2017 10/17/17   Mardella Layman, MD  predniSONE (DELTASONE) 20 MG tablet 3 tabs po day one, then 2 po daily x 4 days Patient not taking: Reported on 11/16/2017 11/09/17   Palumbo, April, MD  predniSONE (STERAPRED UNI-PAK 21 TAB) 10 MG (21) TBPK tablet Take by mouth daily. Take as directed. Patient not taking: Reported on 11/09/2017 10/17/17   Mardella Layman, MD  Prenatal-DSS-FeCb-FeGl-FA (CITRANATAL BLOOM) 90-1 MG TABS Take 1 tablet daily by mouth. Patient not taking: Reported on  11/09/2017 12/08/16   Orvilla Cornwall A, CNM  terconazole (TERAZOL 7) 0.4 % vaginal cream Place 1 applicator vaginally at bedtime. Patient not taking: Reported on 11/09/2017 08/24/17   Marylene Land, CNM    Family History Family History  Problem Relation Age of Onset  . Hypertension Mother   . Diabetes Maternal Grandmother   . Anemia Maternal Grandmother     Social History Social History   Tobacco Use  . Smoking status: Never Smoker  . Smokeless tobacco: Never Used  Substance Use Topics  . Alcohol use: No  . Drug use: Yes    Types: Marijuana    Comment: occasional but not recently     Allergies   Shellfish allergy   Review of Systems Review of Systems   Physical Exam Triage Vital Signs ED Triage Vitals  Enc  Vitals Group     BP 11/16/17 1001 116/72     Pulse Rate 11/16/17 1001 70     Resp 11/16/17 1001 18     Temp 11/16/17 1001 99 F (37.2 C)     Temp Source 11/16/17 1001 Oral     SpO2 11/16/17 1001 100 %     Weight --      Height --      Head Circumference --      Peak Flow --      Pain Score 11/16/17 1005 0     Pain Loc --      Pain Edu? --      Excl. in GC? --    No data found.  Updated Vital Signs BP 116/72 (BP Location: Left Arm)   Pulse 70   Temp 99 F (37.2 C) (Oral)   Resp 18   LMP 11/08/2017   SpO2 100%   Visual Acuity Right Eye Distance:   Left Eye Distance:   Bilateral Distance:    Right Eye Near:   Left Eye Near:    Bilateral Near:     Physical Exam  Constitutional: She appears well-developed and well-nourished.  Very pleasant. Non toxic or ill appearing.   HENT:  Head: Normocephalic and atraumatic.  Left-sided upper lip swelling  Eyes: Conjunctivae are normal.  Neck: Normal range of motion.  Pulmonary/Chest: Effort normal.  Musculoskeletal: Normal range of motion.  Neurological: She is alert.  Skin: There is erythema.  Bilateral palmar erythema No hives noted  Psychiatric: She has a normal mood and affect.  Nursing note and vitals reviewed.    UC Treatments / Results  Labs (all labs ordered are listed, but only abnormal results are displayed) Labs Reviewed - No data to display  EKG None  Radiology No results found.  Procedures Procedures (including critical care time)  Medications Ordered in UC Medications - No data to display  Initial Impression / Assessment and Plan / UC Course  I have reviewed the triage vital signs and the nursing notes.  Pertinent labs & imaging results that were available during my care of the patient were reviewed by me and considered in my medical decision making (see chart for details).     Pt is having an allergic reaction to unknown substance. She has an appointment in 2 weeks with allergy specialist.  Instructed to fill the prednisone prescription and make sure that she takes it. Benadryl as needed.  Also gave contact to primary care for further management.   Final Clinical Impressions(s) / UC Diagnoses   Final diagnoses:  Allergic reaction, initial encounter     Discharge  Instructions     Go ahead and fill your prednisone and start taking it.  Benadryl as needed.  Follow up with the allergist as planned.  I will give you a contact for primary care provider for follow up     ED Prescriptions    None     Controlled Substance Prescriptions [place] Controlled Substance Registry consulted? Not Applicable   Janace Aris, NP 11/16/17 1255

## 2017-11-16 NOTE — ED Triage Notes (Signed)
Pt c/o rash on arms that went away, but also states her lips swell often. C/o Upper Lip swelling right now. Resp equal and unlabored.

## 2017-11-16 NOTE — Discharge Instructions (Signed)
Go ahead and fill your prednisone and start taking it.  Benadryl as needed.  Follow up with the allergist as planned.  I will give you a contact for primary care provider for follow up

## 2017-11-22 ENCOUNTER — Encounter: Payer: Self-pay | Admitting: Allergy and Immunology

## 2017-11-22 ENCOUNTER — Ambulatory Visit (INDEPENDENT_AMBULATORY_CARE_PROVIDER_SITE_OTHER): Payer: BLUE CROSS/BLUE SHIELD | Admitting: Allergy and Immunology

## 2017-11-22 VITALS — BP 114/64 | HR 88 | Temp 98.1°F | Resp 16 | Ht 65.0 in | Wt 193.0 lb

## 2017-11-22 DIAGNOSIS — K219 Gastro-esophageal reflux disease without esophagitis: Secondary | ICD-10-CM

## 2017-11-22 DIAGNOSIS — J3089 Other allergic rhinitis: Secondary | ICD-10-CM | POA: Insufficient documentation

## 2017-11-22 DIAGNOSIS — Z91013 Allergy to seafood: Secondary | ICD-10-CM

## 2017-11-22 DIAGNOSIS — L500 Allergic urticaria: Secondary | ICD-10-CM | POA: Diagnosis not present

## 2017-11-22 DIAGNOSIS — T7840XA Allergy, unspecified, initial encounter: Secondary | ICD-10-CM | POA: Insufficient documentation

## 2017-11-22 DIAGNOSIS — T7840XD Allergy, unspecified, subsequent encounter: Secondary | ICD-10-CM | POA: Diagnosis not present

## 2017-11-22 DIAGNOSIS — T783XXA Angioneurotic edema, initial encounter: Secondary | ICD-10-CM | POA: Insufficient documentation

## 2017-11-22 DIAGNOSIS — T783XXD Angioneurotic edema, subsequent encounter: Secondary | ICD-10-CM | POA: Diagnosis not present

## 2017-11-22 MED ORDER — FLUTICASONE PROPIONATE 50 MCG/ACT NA SUSP: NASAL | 5 refills | Status: DC

## 2017-11-22 MED ORDER — FAMOTIDINE 20 MG PO TABS: 20.0000 mg | ORAL_TABLET | Freq: Two times a day (BID) | ORAL | 5 refills | Status: DC | PRN

## 2017-11-22 MED ORDER — EPINEPHRINE 0.3 MG/0.3ML IJ SOAJ: INTRAMUSCULAR | 1 refills | Status: AC

## 2017-11-22 MED ORDER — MONTELUKAST SODIUM 10 MG PO TABS: 10.0000 mg | ORAL_TABLET | Freq: Every day | ORAL | 5 refills | Status: DC

## 2017-11-22 MED ORDER — LEVOCETIRIZINE DIHYDROCHLORIDE 5 MG PO TABS: 5.0000 mg | ORAL_TABLET | Freq: Every evening | ORAL | 5 refills | Status: DC

## 2017-11-22 NOTE — Assessment & Plan Note (Signed)
Associated angioedema occurs in up to 50% of patients with chronic urticaria.  Treatment/diagnostic plan as outlined above. 

## 2017-11-22 NOTE — Assessment & Plan Note (Signed)
   Aeroallergen avoidance measures have been discussed and provided in written form.  Levocetirizine and montelukast have been prescribed (as above).  A prescription has been provided for fluticasone nasal spray, one spray per nostril 1-2 times daily as needed. Proper nasal spray technique has been discussed and demonstrated.  Nasal saline spray (i.e., Simply Saline) or nasal saline lavage (i.e., NeilMed) is recommended as needed and prior to medicated nasal sprays. 

## 2017-11-22 NOTE — Assessment & Plan Note (Signed)
Possible food allergy.  The patients history suggests shellfish allergy, though todays skin tests were negative despite a positive histamine control.  Food allergen skin testing has excellent negative predictive value however there is still a 5% chance that the allergy exists.  Therefore, we will investigate further with serum specific IgE levels and, if negative, open graded oral challenge.  A laboratory order form has been provided for serum specific IgE against shellfish panel.  Until the food allergy has been definitively ruled out, the patient is to continue meticulous avoidance and have access to epinephrine autoinjector 2 pack. 

## 2017-11-22 NOTE — Assessment & Plan Note (Addendum)
Unclear etiology. Skin tests to select food allergens were negative today. NSAIDs and emotional stress commonly exacerbate urticaria but are not the underlying etiology in this case. Physical urticarias are negative by history (i.e. pressure-induced, temperature, vibration, solar, etc.). History and lesions are not consistent with urticaria pigmentosa so I am not suspicious for mastocytosis. There are no concomitant symptoms concerning for anaphylaxis or constitutional symptoms worrisome for an underlying malignancy. We will rule out other potential etiologies with labs. For symptom relief, patient is to take oral antihistamines as directed. The following labs have been ordered: FCeRI antibody, anti-thyroglobulin antibody, thyroid peroxidase antibody, tryptase, urea breath test, C4, CBC, CMP, ESR, ANA, and serum specific IgE against shellfish panel and alpha gal panel.  The patient will be called with further recommendations after lab results have returned.  Instructions have been discussed and provided for H1/H2 receptor blockade with titration to find lowest effective dose.  A prescription has been provided for levocetirizine, 5 mg daily as needed.  A prescription has been provided for montelukast 10 mg daily at bedtime.  A prescription has been provided for famotidine 20 mg twice daily if needed.  Should there be a significant increase or change in symptoms, a journal is to be kept recording any foods eaten, beverages consumed, medications taken within a 6 hour period prior to the onset of symptoms, as well as record activities being performed, and environmental conditions. For any symptoms concerning for anaphylaxis, epinephrine is to be administered and 911 is to be called immediately.  A prescription has been provided for epinephrine 0.3 mg autoinjector (Auvi-Q) 2 pack along with instructions for its proper administration.

## 2017-11-22 NOTE — Progress Notes (Signed)
New Patient Note  RE: Debbie Mercer MRN: [id number] DOB: [birth year] Date of Office Visit: 11/22/2017  Referring provider: No ref. provider found Primary care provider: Patient, No Pcp Per  Chief Complaint: Allergic Reaction; Urticaria; and Angioedema   History of present illness: Debbie Mercer is a 22 y.o. female presenting today for evaluation of allergic reactions.  She reports that over the past 2 months she has experienced recurrent episodes of hives and lip swelling.  The hives are described as red, raised, and pruritic and resolve completely within 24 hours.  The hives typically occur on her upper extremities.  She has had associated lip swelling on 3 or 4 occasions and has had a few episodes of hand and finger swelling, causing pain and decreased mobility of her finger joints.  The episodes seem to occur "out of the blue".  The episodes seem to occur 1 time per week on average.  She does not experience concomitant cardiopulmonary or GI symptoms.  No specific medication, food, skin care product, detergent, soap, or other environmental triggers have been identified.  She has gone to the emergency department on one occasion due to the hives and swelling and was given diphenhydramine and prednisone.  The last episode occurred 1 week ago.  She has a history of shellfish allergy, however feels certain that this is not the etiology because she has carefully avoided shellfish for many years.  When she was in 3rd-4th grade she developed oral pharyngeal pruritus with the consumption of shellfish and has avoided this food since that time.  She does not have an epinephrine autoinjector. She experiences nasal congestion, rhinorrhea, sneezing, nasal pruritus, and ocular pruritus.  These symptoms are most prominent during the springtime and fall.  She typically does not take medication to control the symptoms.  Assessment and plan: Recurrent urticaria Unclear etiology. Skin tests to select food  allergens were negative today. NSAIDs and emotional stress commonly exacerbate urticaria but are not the underlying etiology in this case. Physical urticarias are negative by history (i.e. pressure-induced, temperature, vibration, solar, etc.). History and lesions are not consistent with urticaria pigmentosa so I am not suspicious for mastocytosis. There are no concomitant symptoms concerning for anaphylaxis or constitutional symptoms worrisome for an underlying malignancy. We will rule out other potential etiologies with labs. For symptom relief, patient is to take oral antihistamines as directed. The following labs have been ordered: FCeRI antibody, anti-thyroglobulin antibody, thyroid peroxidase antibody, tryptase, urea breath test, C4, CBC, CMP, ESR, ANA, and serum specific IgE against shellfish panel and alpha gal panel.  The patient will be called with further recommendations after lab results have returned.  Instructions have been discussed and provided for H1/H2 receptor blockade with titration to find lowest effective dose.  A prescription has been provided for levocetirizine, 5 mg daily as needed.  A prescription has been provided for montelukast 10 mg daily at bedtime.  A prescription has been provided for famotidine 20 mg twice daily if needed.  Should there be a significant increase or change in symptoms, a journal is to be kept recording any foods eaten, beverages consumed, medications taken within a 6 hour period prior to the onset of symptoms, as well as record activities being performed, and environmental conditions. For any symptoms concerning for anaphylaxis, epinephrine is to be administered and 911 is to be called immediately.  A prescription has been provided for epinephrine 0.3 mg autoinjector (Auvi-Q) 2 pack along with instructions for its proper administration.  Angioedema Associated  angioedema occurs in up to 50% of patients with chronic urticaria.  Treatment/diagnostic  plan as outlined above.  Allergy to shellfish Possible food allergy.  The patients history suggests shellfish allergy, though todays skin tests were negative despite a positive histamine control.  Food allergen skin testing has excellent negative predictive value however there is still a 5% chance that the allergy exists.  Therefore, we will investigate further with serum specific IgE levels and, if negative, open graded oral challenge.  A laboratory order form has been provided for serum specific IgE against shellfish panel.  Until the food allergy has been definitively ruled out, the patient is to continue meticulous avoidance and have access to epinephrine autoinjector 2 pack.  Seasonal allergic rhinitis  Aeroallergen avoidance measures have been discussed and provided in written form.  Levocetirizine and montelukast have been prescribed (as above).  A prescription has been provided for fluticasone nasal spray, one spray per nostril 1-2 times daily as needed. Proper nasal spray technique has been discussed and demonstrated.  Nasal saline spray (i.e., Simply Saline) or nasal saline lavage (i.e., NeilMed) is recommended as needed and prior to medicated nasal sprays.   Meds ordered this encounter  Medications  . levocetirizine (XYZAL) 5 MG tablet    Sig: Take 1 tablet (5 mg total) by mouth every evening.    Dispense:  30 tablet    Refill:  5  . famotidine (PEPCID) 20 MG tablet    Sig: Take 1 tablet (20 mg total) by mouth 2 (two) times daily as needed.    Dispense:  60 tablet    Refill:  5  . montelukast (SINGULAIR) 10 MG tablet    Sig: Take 1 tablet (10 mg total) by mouth at bedtime.    Dispense:  30 tablet    Refill:  5  . EPINEPHrine (AUVI-Q) 0.3 mg/0.3 mL IJ SOAJ injection    Sig: Use as directed for severe allergic reaction.    Dispense:  4 Device    Refill:  1  . fluticasone (FLONASE) 50 MCG/ACT nasal spray    Sig: One spray each nostril one to two times a day as needed  for nasal congestion or drainage.    Dispense:  16 g    Refill:  5    Diagnostics: Environmental skin testing: Positive to grass pollen. Food allergen skin testing: Negative despite a positive histamine control.    Physical examination: Blood pressure 114/64, pulse 88, temperature 98.1 F (36.7 C), temperature source Oral, resp. rate 16, height 5' 5" (1.651 m), weight 193 lb (87.5 kg), last menstrual period 11/08/2017, SpO2 98 %, currently breastfeeding.  General: Alert, interactive, in no acute distress. HEENT: TMs pearly gray, turbinates moderately edematous without discharge, post-pharynx mildly erythematous. Neck: Supple without lymphadenopathy. Lungs: Clear to auscultation without wheezing, rhonchi or rales. CV: Normal S1, S2 without murmurs. Abdomen: Nondistended, nontender. Skin: Warm and dry, without lesions or rashes. Extremities:  No clubbing, cyanosis or edema. Neuro:   Grossly intact.  Review of systems:  Review of systems negative except as noted in HPI / PMHx or noted below: Review of Systems  Constitutional: Negative.   HENT: Negative.   Eyes: Negative.   Respiratory: Negative.   Cardiovascular: Negative.   Gastrointestinal: Negative.   Genitourinary: Negative.   Musculoskeletal: Negative.   Skin: Negative.   Neurological: Negative.   Endo/Heme/Allergies: Negative.   Psychiatric/Behavioral: Negative.     Past medical history:  Past Medical History:  Diagnosis Date  . Medical history non-contributory  Past surgical history:  Past Surgical History:  Procedure Laterality Date  . BREAST LUMPECTOMY    . TOOTH EXTRACTION      Family history: Family History  Problem Relation Age of Onset  . Hypertension Mother   . Allergic rhinitis Mother   . Diabetes Maternal Grandmother   . Anemia Maternal Grandmother   . Food Allergy Sister        shellfish allergy  . Angioedema Neg Hx   . Asthma Neg Hx   . Eczema Neg Hx   . Immunodeficiency Neg Hx   .  Urticaria Neg Hx     Social history: Social History   Socioeconomic History  . Marital status: Single    Spouse name: Not on file  . Number of children: Not on file  . Years of education: Not on file  . Highest education level: Not on file  Occupational History  . Not on file  Social Needs  . Financial resource strain: Not on file  . Food insecurity:    Worry: Not on file    Inability: Not on file  . Transportation needs:    Medical: Not on file    Non-medical: Not on file  Tobacco Use  . Smoking status: Never Smoker  . Smokeless tobacco: Never Used  Substance and Sexual Activity  . Alcohol use: No  . Drug use: Not Currently    Types: Marijuana    Comment: occasional but not recently  . Sexual activity: Yes    Partners: Male  Lifestyle  . Physical activity:    Days per week: Not on file    Minutes per session: Not on file  . Stress: Not on file  Relationships  . Social connections:    Talks on phone: Not on file    Gets together: Not on file    Attends religious service: Not on file    Active member of club or organization: Not on file    Attends meetings of clubs or organizations: Not on file    Relationship status: Not on file  . Intimate partner violence:    Fear of current or ex partner: Not on file    Emotionally abused: Not on file    Physically abused: Not on file    Forced sexual activity: Not on file  Other Topics Concern  . Not on file  Social History Narrative  . Not on file   Environmental History: The patient lives in a 24 year old house with carpeting throughout and central air/heat.  There is no known mold/water damage in the home.  There is a dog in the home which does not have access to her bedroom.  She is a non-smoker.  Allergies as of 11/22/2017      Reactions   Shellfish Allergy Anaphylaxis, Itching      Medication List        Accurate as of 11/22/17  1:39 PM. Always use your most recent med list.          CITRANATAL BLOOM  90-1 MG Tabs Take 1 tablet daily by mouth.   EPINEPHrine 0.3 mg/0.3 mL Soaj injection Commonly known as:  EPI-PEN Use as directed for severe allergic reaction.   famotidine 20 MG tablet Commonly known as:  PEPCID Take 1 tablet (20 mg total) by mouth 2 (two) times daily as needed.   fluticasone 50 MCG/ACT nasal spray Commonly known as:  FLONASE One spray each nostril one to two times a day as needed for nasal  congestion or drainage.   HYDROcodone-Acetaminophen 10-325 MG/15ML Soln Take 1 tablet by mouth every 8 (eight) hours as needed (pain).   ibuprofen 600 MG tablet Commonly known as:  ADVIL,MOTRIN Take 1 tablet (600 mg total) by mouth every 6 (six) hours as needed.   levocetirizine 5 MG tablet Commonly known as:  XYZAL Take 1 tablet (5 mg total) by mouth every evening.   montelukast 10 MG tablet Commonly known as:  SINGULAIR Take 1 tablet (10 mg total) by mouth at bedtime.   permethrin 5 % cream Commonly known as:  ELIMITE Apply from neck down before bed then wash off in the morning. May repeat in one week.   predniSONE 10 MG (21) Tbpk tablet Commonly known as:  STERAPRED UNI-PAK 21 TAB Take by mouth daily. Take as directed.       Known medication allergies: Allergies  Allergen Reactions  . Shellfish Allergy Anaphylaxis and Itching    I appreciate the opportunity to take part in Debbie Mercer's care. Please do not hesitate to contact me with questions.  Sincerely,   R. Edgar Frisk, MD

## 2017-11-22 NOTE — Patient Instructions (Addendum)
Recurrent urticaria Unclear etiology. Skin tests to select food allergens were negative today. NSAIDs and emotional stress commonly exacerbate urticaria but are not the underlying etiology in this case. Physical urticarias are negative by history (i.e. pressure-induced, temperature, vibration, solar, etc.). History and lesions are not consistent with urticaria pigmentosa so I am not suspicious for mastocytosis. There are no concomitant symptoms concerning for anaphylaxis or constitutional symptoms worrisome for an underlying malignancy. We will rule out other potential etiologies with labs. For symptom relief, patient is to take oral antihistamines as directed. The following labs have been ordered: FCeRI antibody, anti-thyroglobulin antibody, thyroid peroxidase antibody, tryptase, urea breath test, C4, CBC, CMP, ESR, ANA, and serum specific IgE against shellfish panel and alpha gal panel.  The patient will be called with further recommendations after lab results have returned.  Instructions have been discussed and provided for H1/H2 receptor blockade with titration to find lowest effective dose.  A prescription has been provided for levocetirizine, 5 mg daily as needed.  A prescription has been provided for montelukast 10 mg daily at bedtime.  A prescription has been provided for famotidine 20 mg twice daily if needed.  Should there be a significant increase or change in symptoms, a journal is to be kept recording any foods eaten, beverages consumed, medications taken within a 6 hour period prior to the onset of symptoms, as well as record activities being performed, and environmental conditions. For any symptoms concerning for anaphylaxis, epinephrine is to be administered and 911 is to be called immediately.  A prescription has been provided for epinephrine 0.3 mg autoinjector (Auvi-Q) 2 pack along with instructions for its proper administration.  Angioedema Associated angioedema occurs in up to  50% of patients with chronic urticaria.  Treatment/diagnostic plan as outlined above.  Allergy to shellfish Possible food allergy.  The patients history suggests shellfish allergy, though todays skin tests were negative despite a positive histamine control.  Food allergen skin testing has excellent negative predictive value however there is still a 5% chance that the allergy exists.  Therefore, we will investigate further with serum specific IgE levels and, if negative, open graded oral challenge.  A laboratory order form has been provided for serum specific IgE against shellfish panel.  Until the food allergy has been definitively ruled out, the patient is to continue meticulous avoidance and have access to epinephrine autoinjector 2 pack.  Seasonal allergic rhinitis  Aeroallergen avoidance measures have been discussed and provided in written form.  Levocetirizine and montelukast have been prescribed (as above).  A prescription has been provided for fluticasone nasal spray, one spray per nostril 1-2 times daily as needed. Proper nasal spray technique has been discussed and demonstrated.  Nasal saline spray (i.e., Simply Saline) or nasal saline lavage (i.e., NeilMed) is recommended as needed and prior to medicated nasal sprays.   When lab results have returned the patient will be called with further recommendations and follow up instructions.  Urticaria (Hives)  . Levocetirizine (Xyzal) 5 mg twice a day and famotidine (Pepcid) 20 mg twice a day. If no symptoms for 7-14 days then decrease to. . Levocetirizine (Xyzal) 5 mg twice a day and famotidine (Pepcid) 20 mg once a day.  If no symptoms for 7-14 days then decrease to. . Levocetirizine (Xyzal) 5 mg twice a day.  If no symptoms for 7-14 days then decrease to. . Levocetirizine (Xyzal) 5 mg once a day.  May use Benadryl (diphenhydramine) as needed for breakthrough symptoms  If symptoms return, then step up dosage  Reducing  Pollen Exposure  The American Academy of Allergy, Asthma and Immunology suggests the following steps to reduce your exposure to pollen during allergy seasons.    1. Do not hang sheets or clothing out to dry; pollen may collect on these items. 2. Do not mow lawns or spend time around freshly cut grass; mowing stirs up pollen. 3. Keep windows closed at night.  Keep car windows closed while driving. 4. Minimize morning activities outdoors, a time when pollen counts are usually at their highest. 5. Stay indoors as much as possible when pollen counts or humidity is high and on windy days when pollen tends to remain in the air longer. 6. Use air conditioning when possible.  Many air conditioners have filters that trap the pollen spores. 7. Use a HEPA room air filter to remove pollen form the indoor air you breathe.

## 2017-11-24 LAB — H. PYLORI BREATH TEST: H pylori Breath Test: NEGATIVE

## 2017-11-29 LAB — COMPREHENSIVE METABOLIC PANEL
ALT: 10 IU/L (ref 0–32)
AST: 14 IU/L (ref 0–40)
Albumin/Globulin Ratio: 1.6 (ref 1.2–2.2)
Albumin: 4.4 g/dL (ref 3.5–5.5)
Alkaline Phosphatase: 82 IU/L (ref 39–117)
BUN/Creatinine Ratio: 17 (ref 9–23)
BUN: 11 mg/dL (ref 6–20)
Bilirubin Total: 0.5 mg/dL (ref 0.0–1.2)
CO2: 23 mmol/L (ref 20–29)
Calcium: 9.2 mg/dL (ref 8.7–10.2)
Chloride: 102 mmol/L (ref 96–106)
Creatinine, Ser: 0.63 mg/dL (ref 0.57–1.00)
GFR calc Af Amer: 145 mL/min/{1.73_m2} (ref >59)
GFR calc non Af Amer: 126 mL/min/{1.73_m2} (ref >59)
Globulin, Total: 2.7 g/dL (ref 1.5–4.5)
Glucose: 78 mg/dL (ref 65–99)
Potassium: 3.8 mmol/L (ref 3.5–5.2)
Sodium: 141 mmol/L (ref 134–144)
Total Protein: 7.1 g/dL (ref 6.0–8.5)

## 2017-11-29 LAB — CBC WITH DIFFERENTIAL/PLATELET
Basophils Absolute: 0 10*3/uL (ref 0.0–0.2)
Basos: 0 %
EOS (ABSOLUTE): 0.3 10*3/uL (ref 0.0–0.4)
Eos: 5 %
Hematocrit: 41.8 % (ref 34.0–46.6)
Hemoglobin: 12.6 g/dL (ref 11.1–15.9)
Immature Grans (Abs): 0 10*3/uL (ref 0.0–0.1)
Immature Granulocytes: 0 %
Lymphocytes Absolute: 1.5 10*3/uL (ref 0.7–3.1)
Lymphs: 23 %
MCH: 22.9 pg — ABNORMAL LOW (ref 26.6–33.0)
MCHC: 30.1 g/dL — ABNORMAL LOW (ref 31.5–35.7)
MCV: 76 fL — ABNORMAL LOW (ref 79–97)
Monocytes Absolute: 1 10*3/uL — ABNORMAL HIGH (ref 0.1–0.9)
Monocytes: 15 %
Neutrophils Absolute: 3.7 10*3/uL (ref 1.4–7.0)
Neutrophils: 57 %
Platelets: 356 10*3/uL (ref 150–450)
RBC: 5.51 x10E6/uL — ABNORMAL HIGH (ref 3.77–5.28)
RDW: 12.7 % (ref 12.3–15.4)
WBC: 6.5 10*3/uL (ref 3.4–10.8)

## 2017-11-29 LAB — ALLERGEN PROFILE, SHELLFISH
Clam IgE: <0.1 kU/L
F023-IgE Crab: 0.12 kU/L — AB
F080-IgE Lobster: <0.1 kU/L
F290-IgE Oyster: <0.1 kU/L
Scallop IgE: <0.1 kU/L
Shrimp IgE: <0.1 kU/L

## 2017-11-29 LAB — ALPHA-GAL PANEL
Alpha Gal IgE*: <0.1 kU/L (ref <0.10)
Beef (Bos spp) IgE: <0.1 kU/L (ref <0.35)
Class Interpretation: 0
Class Interpretation: 0
Class Interpretation: 0
Lamb/Mutton (Ovis spp) IgE: <0.1 kU/L (ref <0.35)
Pork (Sus spp) IgE: <0.1 kU/L (ref <0.35)

## 2017-11-29 LAB — TRYPTASE: Tryptase: 5.7 ug/L (ref 2.2–13.2)

## 2017-11-29 LAB — THYROID PEROXIDASE ANTIBODY: Thyroperoxidase Ab SerPl-aCnc: 11 IU/mL (ref 0–34)

## 2017-11-29 LAB — ANA W/REFLEX IF POSITIVE: Anti Nuclear Antibody(ANA): NEGATIVE

## 2017-11-29 LAB — CHRONIC URTICARIA: cu index: 34.7 — ABNORMAL HIGH (ref <10)

## 2017-11-29 LAB — THYROGLOBULIN ANTIBODY: Thyroglobulin Antibody: <1 IU/mL (ref 0.0–0.9)

## 2017-11-29 LAB — C4 COMPLEMENT: Complement C4, Serum: 43 mg/dL (ref 14–44)

## 2017-11-29 LAB — SEDIMENTATION RATE: Sed Rate: 1 mm/hr (ref 0–32)

## 2017-12-09 ENCOUNTER — Ambulatory Visit (HOSPITAL_COMMUNITY)
Admission: EM | Admit: 2017-12-09 | Discharge: 2017-12-09 | Disposition: A | Discharge status: Home / Self Care | Payer: BLUE CROSS/BLUE SHIELD | Attending: Radiology | Admitting: Radiology

## 2017-12-09 ENCOUNTER — Encounter (HOSPITAL_COMMUNITY): Payer: Self-pay

## 2017-12-09 DIAGNOSIS — R21 Rash and other nonspecific skin eruption: Secondary | ICD-10-CM

## 2017-12-09 MED ORDER — ACYCLOVIR 400 MG PO TABS: 400.0000 mg | ORAL_TABLET | Freq: Two times a day (BID) | ORAL | 0 refills | Status: DC

## 2017-12-09 MED ORDER — PREDNISONE 10 MG (21) PO TBPK: ORAL_TABLET | Freq: Every day | ORAL | 0 refills | Status: DC

## 2017-12-09 NOTE — ED Notes (Signed)
Pt discharged by provider.

## 2017-12-09 NOTE — ED Triage Notes (Signed)
Pt presents with complaints of facial swelling every night after 11 pm for months. Denies any pain. Breathing is unlabored and even. Pt also endorses breaking out in hives on arms this morning.

## 2017-12-09 NOTE — ED Provider Notes (Addendum)
[facility]    CSN: [id number] Arrival date & time: 12/09/17  1040     History   Chief Complaint Chief Complaint  Patient presents with  . Facial Swelling    HPI Debbie Mercer is a 22 y.o. female.   22 year old female presents with hives and cold sore to right upper lip x1 day.  Patient states that she has these symptoms intermittently usually occurs on a Thursday or Friday at night and it self resolves.  Patient states that she is previously seen this facility and given prednisone that temporarily resolved her rash however it has returned.  Patient states this is the first time that she is not had these symptoms consecutively for 2 nights.  Patient denies any fevers upper respiratory infection signs and symptoms nausea vomiting or diarrhea. Patient is currenlty breastfeeding.  Patient states that she was previously seen for similar signs and symptoms and was given prednisone.  Which temporarily relieved her symptoms.     Past Medical History:  Diagnosis Date  . Medical history non-contributory     Patient Active Problem List   Diagnosis Date Noted  . Recurrent urticaria 11/22/2017  . Angioedema 11/22/2017  . Allergic reaction 11/22/2017  . Seasonal allergic rhinitis 11/22/2017  . Mild tetrahydrocannabinol (THC) abuse 08/25/2016  . 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 Vitamin D deficiency 08/02/2016  . History of acute pyelonephritis 07/28/2016  . Allergy to shellfish 03/05/2013  . 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 GERD (gastroesophageal reflux disease) 03/05/2013    Past Surgical History:  Procedure Laterality Date  . BREAST LUMPECTOMY    . TOOTH EXTRACTION      OB History    Gravida  3   Para  2   Term  2   Preterm  0   AB  1   Living  2     SAB  1   TAB  0   Ectopic  0   Multiple  0   Live Births  2            Home Medications    Prior to Admission medications     Medication Sig Start Date End Date Taking? Authorizing Provider  EPINEPHrine (AUVI-Q) 0.3 mg/0.3 mL IJ SOAJ injection Use as directed for severe allergic reaction. 11/22/17   Bobbitt, Heywood Iles, MD  famotidine (PEPCID) 20 MG tablet Take 1 tablet (20 mg total) by mouth 2 (two) times daily as needed. 11/22/17   Bobbitt, Heywood Iles, MD  fluticasone (FLONASE) 50 MCG/ACT nasal spray One spray each nostril one to two times a day as needed for nasal congestion or drainage. 11/22/17   Bobbitt, Heywood Iles, MD  HYDROcodone-Acetaminophen 10-325 MG/15ML SOLN Take 1 tablet by mouth every 8 (eight) hours as needed (pain).    [provider]  ibuprofen (ADVIL,MOTRIN) 600 MG tablet Take 1 tablet (600 mg total) by mouth every 6 (six) hours as needed. 08/22/17   Brock Bad, MD  levocetirizine (XYZAL) 5 MG tablet Take 1 tablet (5 mg total) by mouth every evening. 11/22/17   Bobbitt, Heywood Iles, MD  montelukast (SINGULAIR) 10 MG tablet Take 1 tablet (10 mg total) by mouth at bedtime. 11/22/17   Bobbitt, Heywood Iles, MD  Prenatal-DSS-FeCb-FeGl-FA [facility] BLOOM) 90-1 MG TABS Take 1 tablet daily by mouth. 12/08/16   Roe Coombs, CNM    Family History Family History  Problem Relation Age of Onset  . Hypertension  Mother   . Allergic rhinitis Mother   . Diabetes Maternal Grandmother   . Anemia Maternal Grandmother   . Food Allergy Sister        shellfish allergy  . Angioedema Neg Hx   . Asthma Neg Hx   . Eczema Neg Hx   . Immunodeficiency Neg Hx   . Urticaria Neg Hx     Social History Social History   Tobacco Use  . Smoking status: Never Smoker  . Smokeless tobacco: Never Used  Substance Use Topics  . Alcohol use: No  . Drug use: Not Currently    Types: Marijuana    Comment: occasional but not recently     Allergies   Shellfish allergy   Review of Systems Review of Systems  Constitutional: Negative for chills and fever.  HENT: Negative for ear pain and sore  throat.   Eyes: Negative for pain and visual disturbance.  Respiratory: Negative for cough and shortness of breath.   Cardiovascular: Negative for chest pain and palpitations.  Gastrointestinal: Negative for abdominal pain and vomiting.  Genitourinary: Negative for dysuria and hematuria.  Musculoskeletal: Negative for arthralgias and back pain.  Skin: Positive for rash ( left upper lip, hives to abdomen). Negative for color change.  Neurological: Negative for seizures and syncope.  All other systems reviewed and are negative.    Physical Exam Triage Vital Signs ED Triage Vitals  Enc Vitals Group     BP 12/09/17 1142 103/64     Pulse Rate 12/09/17 1142 70     Resp 12/09/17 1142 19     Temp 12/09/17 1142 98.4 F (36.9 C)     Temp src --      SpO2 12/09/17 1142 100 %     Weight --      Height --      Head Circumference --      Peak Flow --      Pain Score 12/09/17 1141 0     Pain Loc --      Pain Edu? --      Excl. in [place]? --    No data found.  Updated Vital Signs BP 103/64   Pulse 70   Temp 98.4 F (36.9 C)   Resp 19   LMP 12/09/2017   SpO2 100%   Breastfeeding? Yes   Visual Acuity Right Eye Distance:   Left Eye Distance:   Bilateral Distance:    Right Eye Near:   Left Eye Near:    Bilateral Near:     Physical Exam  Constitutional: She is oriented to person, place, and time. She appears well-developed and well-nourished.  HENT:  Head: Normocephalic and atraumatic.  Eyes: Conjunctivae are normal.  Neck: Normal range of motion.  Pulmonary/Chest: Effort normal.  Neurological: She is alert and oriented to person, place, and time.  Skin: Rash ( blister like rash to top right upper lip) noted.  Psychiatric: She has a normal mood and affect.  Nursing note and vitals reviewed.    UC Treatments / Results  Labs (all labs ordered are listed, but only abnormal results are displayed) Labs Reviewed - No data to display  EKG None  Radiology No results  found.  Procedures Procedures (including critical care time)  Medications Ordered in UC Medications - No data to display  Initial Impression / Assessment and Plan / UC Course  I have reviewed the triage vital signs and the nursing notes.  Pertinent labs & imaging results that were available  during my care of the patient were reviewed by me and considered in my medical decision making (see chart for details).      Final Clinical Impressions(s) / UC Diagnoses   Final diagnoses:  None   Discharge Instructions   None    ED Prescriptions    None     Controlled Substance Prescriptions [employer] Controlled Substance Registry consulted? Not Applicable   Alene Mires, NP 12/09/17 1210    Alene Mires, NP 12/09/17 1213

## 2018-02-16 IMAGING — US US MFM FETAL BPP W/O NON-STRESS
1 series · 16 of 28 positions shown · non-contrast
Comparison: none

[Series 1: us mfm fetal bpp w/o non-stress · 28 acquisitions, 16 frames shown]
[im 1/28]
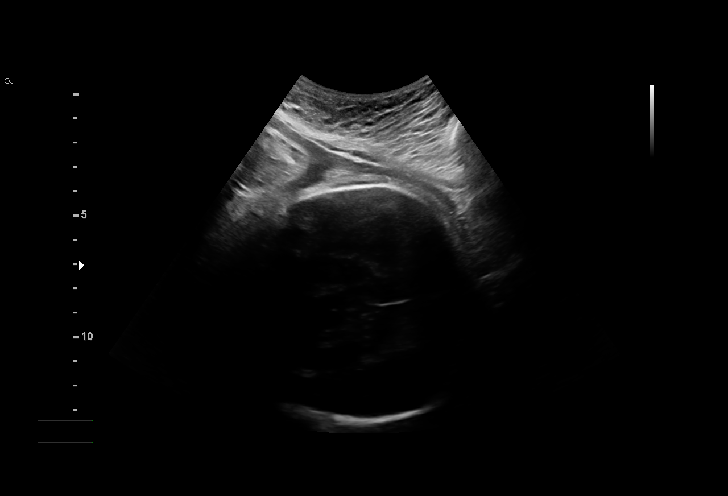
[im 3/28]
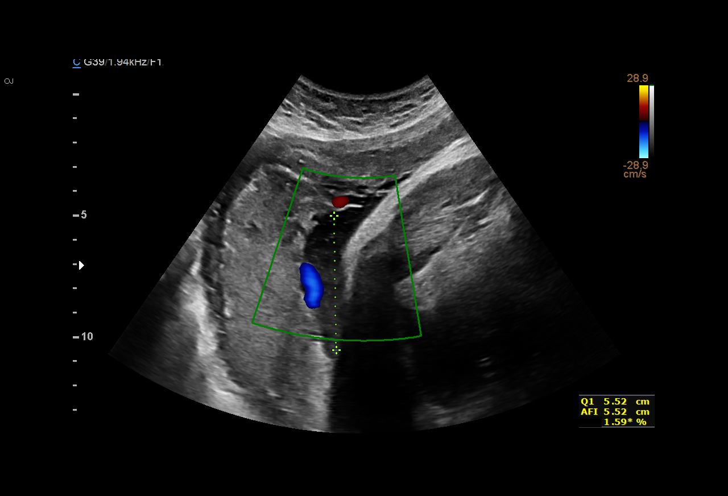
[im 5/28]
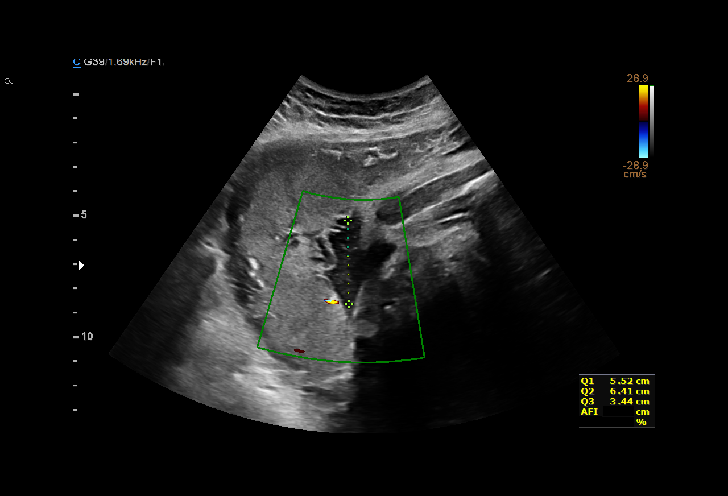
[im 7/28]
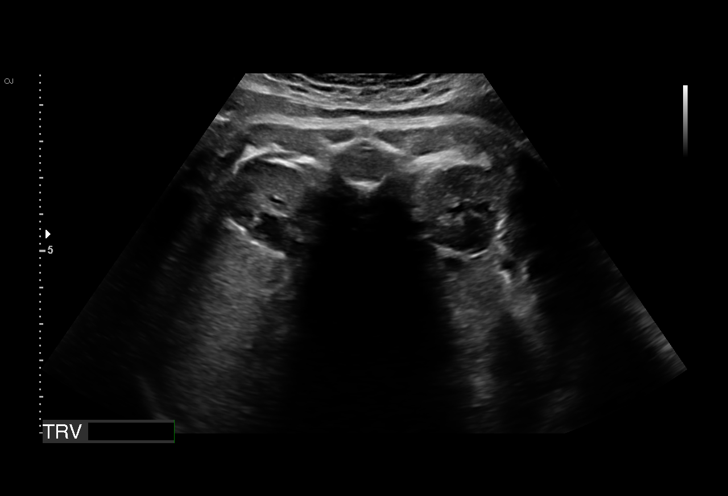
[im 8/28]
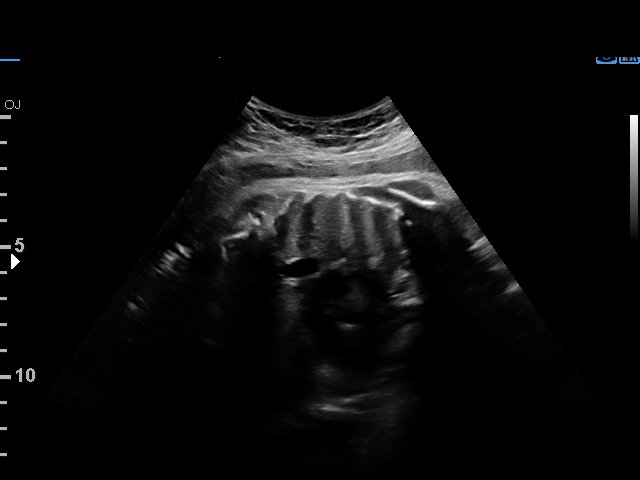
[im 10/28]
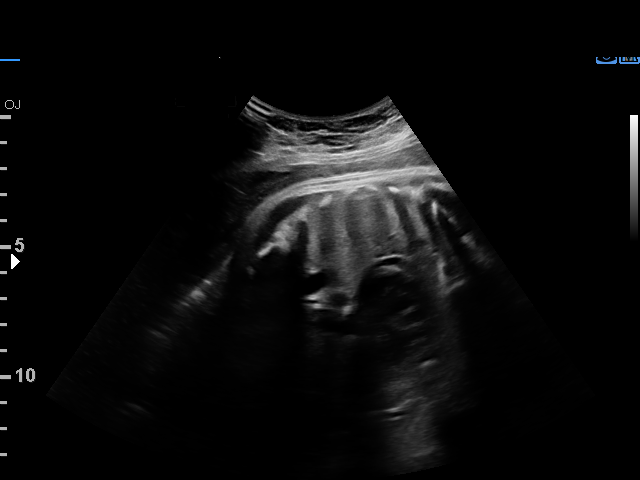
[im 12/28]
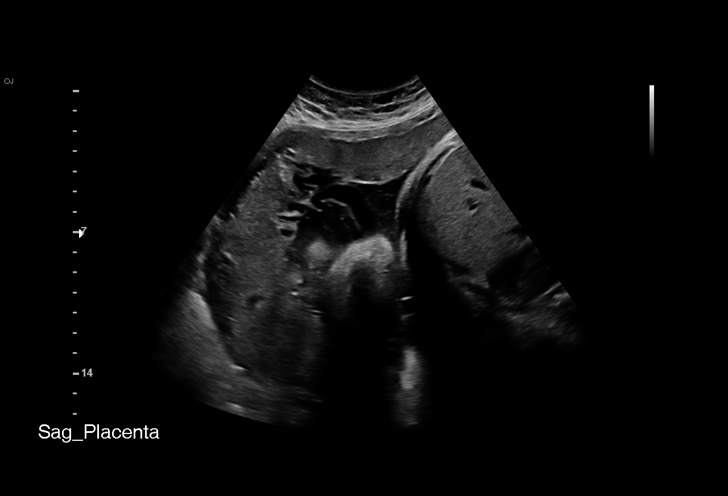
[im 14/28]
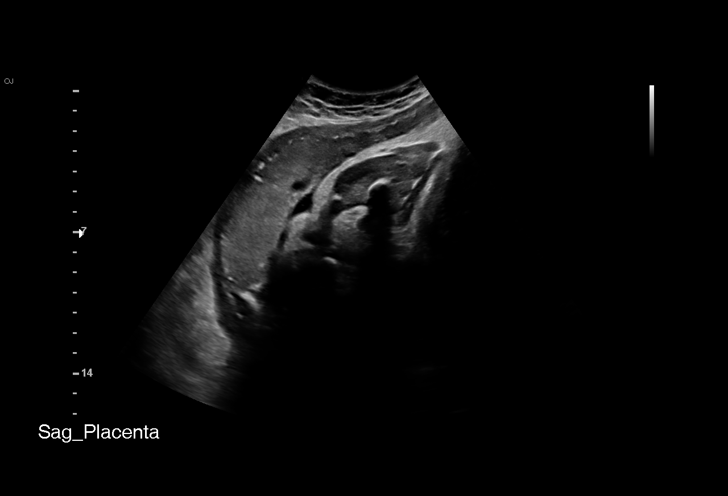
[im 15/28]
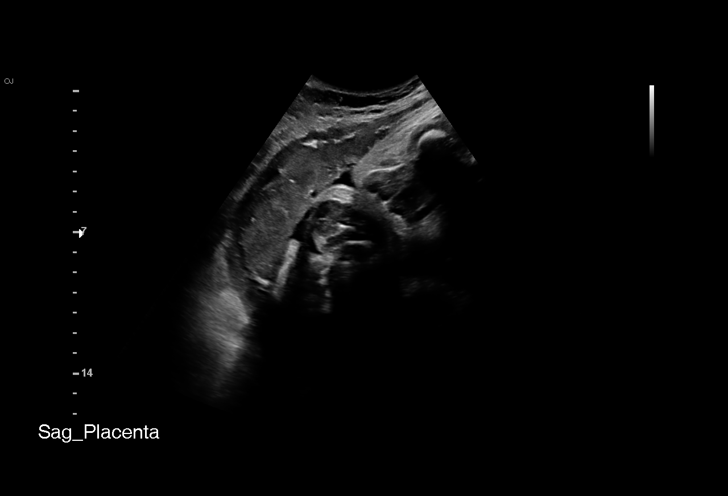
[im 17/28]
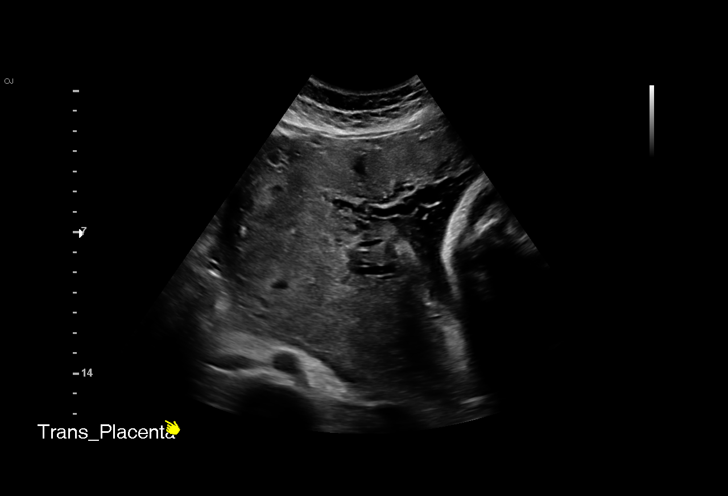
[im 19/28]
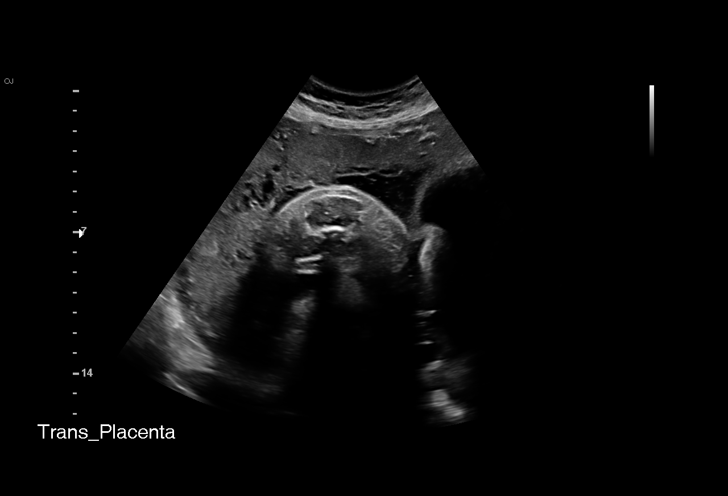
[im 21/28]
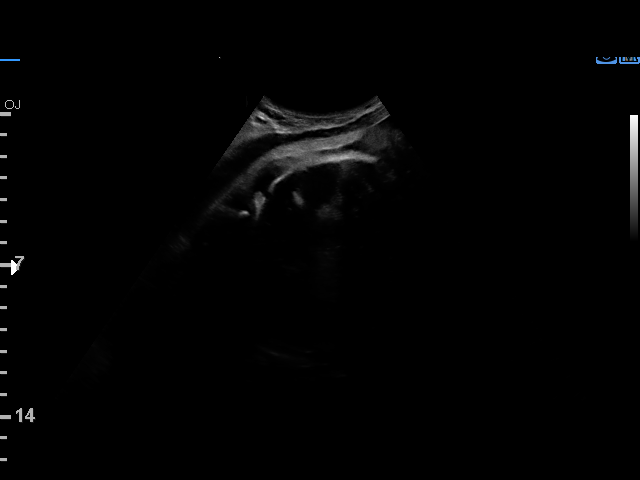
[im 22/28]
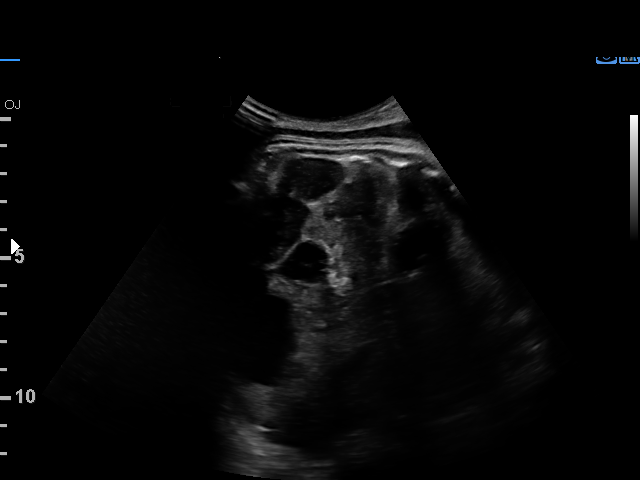
[im 24/28]
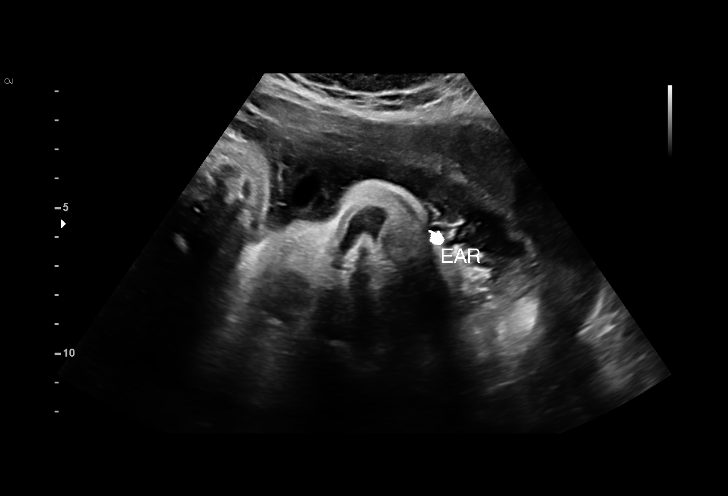
[im 26/28]
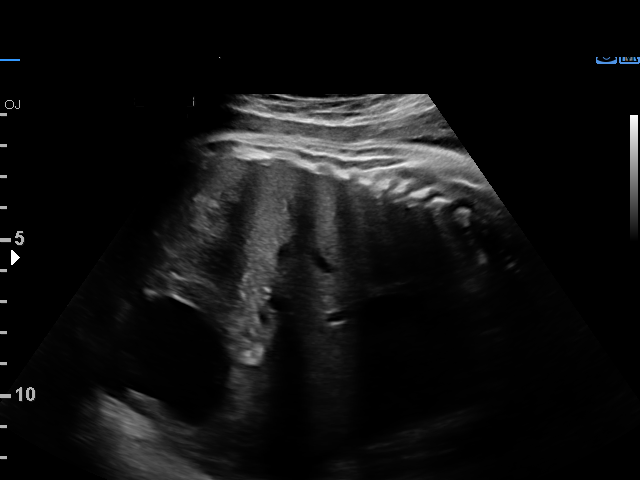
[im 28/28]
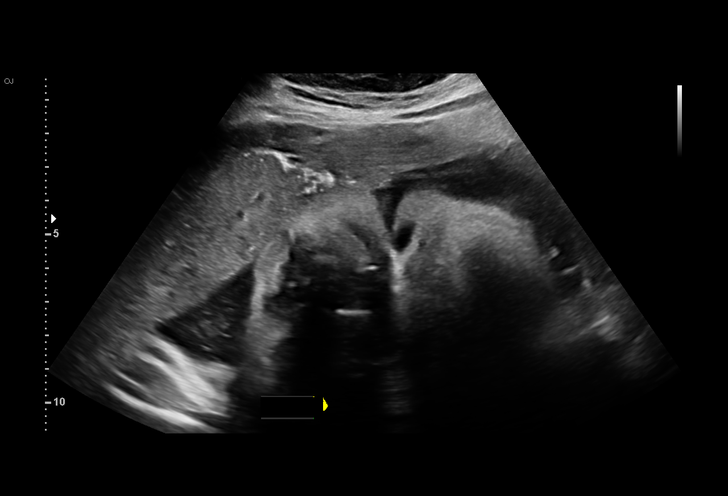

[16 of 28 positions shown; findings below may reference images not displayed]

OB/Gyn Clinic

Indications

40 weeks gestation of pregnancy
Non-reactive NST
OB History

Gravidity:    3         Term:   1
Living:       1
Fetal Evaluation

Num Of Fetuses:     1
Fetal Heart         141
Rate(bpm):
Cardiac Activity:   Observed
Presentation:       Cephalic

Amniotic Fluid
AFI FV:      Subjectively within normal limits

AFI Sum(cm)     %Tile       Largest Pocket(cm)
19.35           86

RUQ(cm)       RLQ(cm)       LUQ(cm)        LLQ(cm)
5.52
Biophysical Evaluation
Amniotic F.V:   Within normal limits       F. Tone:        Observed
F. Movement:    Observed                   N.S.T:          Nonreactive
F. Breathing:   Not Observed               Score:          [DATE]
Gestational Age

LMP:           40w 1d        Date:  05/16/16                 EDD:   02/20/17
Best:          40w 1d     Det. By:  LMP  (05/16/16)          EDD:   02/20/17
Impression

Single living intrauterine pregnancy at 40w 1d.
Cephalic presentation.
Normal amniotic fluid volume.
BPP [DATE] (nonreactive NST, absent fetal breathing).
Recommendations

sent for admission to L&D.

## 2018-10-11 ENCOUNTER — Encounter (HOSPITAL_COMMUNITY): Payer: Self-pay | Admitting: Emergency Medicine

## 2018-10-11 ENCOUNTER — Other Ambulatory Visit: Payer: Self-pay

## 2018-10-11 ENCOUNTER — Ambulatory Visit (HOSPITAL_COMMUNITY)
Admission: EM | Admit: 2018-10-11 | Discharge: 2018-10-11 | Disposition: A | Discharge status: Home / Self Care | Payer: PRIVATE HEALTH INSURANCE | Attending: Internal Medicine | Admitting: Internal Medicine

## 2018-10-11 DIAGNOSIS — G43101 Migraine with aura, not intractable, with status migrainosus: Secondary | ICD-10-CM | POA: Diagnosis not present

## 2018-10-11 MED ORDER — IBUPROFEN 600 MG PO TABS: 600.0000 mg | ORAL_TABLET | Freq: Four times a day (QID) | ORAL | 0 refills | Status: DC | PRN

## 2018-10-11 MED ORDER — DEXAMETHASONE SODIUM PHOSPHATE 10 MG/ML IJ SOLN
INTRAMUSCULAR | Status: AC
  Filled 2018-10-11: qty 1

## 2018-10-11 MED ORDER — METOCLOPRAMIDE HCL 5 MG/ML IJ SOLN
10.0000 mg | Freq: Once | INTRAMUSCULAR | Status: AC
  Administered 2018-10-11: 10 mg via INTRAMUSCULAR

## 2018-10-11 MED ORDER — DEXAMETHASONE SODIUM PHOSPHATE 10 MG/ML IJ SOLN
10.0000 mg | Freq: Once | INTRAMUSCULAR | Status: AC
  Administered 2018-10-11: 10 mg via INTRAMUSCULAR

## 2018-10-11 MED ORDER — METOCLOPRAMIDE HCL 5 MG/ML IJ SOLN
INTRAMUSCULAR | Status: AC
Start: 2018-10-11 — End: ?
  Filled 2018-10-11: qty 2

## 2018-10-11 MED ORDER — SUMATRIPTAN SUCCINATE 25 MG PO TABS: ORAL_TABLET | ORAL | 0 refills | Status: DC

## 2018-10-11 MED ORDER — SUMATRIPTAN SUCCINATE 6 MG/0.5ML ~~LOC~~ SOLN
6.0000 mg | Freq: Once | SUBCUTANEOUS | Status: AC
  Administered 2018-10-11: 6 mg via SUBCUTANEOUS

## 2018-10-11 MED ORDER — SUMATRIPTAN SUCCINATE 6 MG/0.5ML ~~LOC~~ SOLN
SUBCUTANEOUS | Status: AC
  Filled 2018-10-11: qty 0.5

## 2018-10-11 NOTE — ED Provider Notes (Signed)
[facility]    CSN: [id number] Arrival date & time: 10/11/18  0813      History   Chief Complaint Chief Complaint  Patient presents with  . Migraine    HPI Debbie Mercer is a 23 y.o. female past medical history of migraines comes to urgent care with a 2 to 3 weeks history of headaches.  Headache is sometimes unilateral and other times global.  Its onset was insidious and is gotten progressively worse and currently persistent.  At worst the pain is 10 out of 10.  It is aggravated by light.  It is sometimes relieved by taking ibuprofen.  Over the past few days patient says the headache is become persistent.  It is associated with floaters in her visual field and nausea.  She denies any difficulty finding her words.  No numbness or tingling.  Last menstrual period ended 8/25.   HPI  Past Medical History:  Diagnosis Date  . Medical history non-contributory     Patient Active Problem List   Diagnosis Date Noted  . Recurrent urticaria 11/22/2017  . Angioedema 11/22/2017  . Allergic reaction 11/22/2017  . Seasonal allergic rhinitis 11/22/2017  . Mild tetrahydrocannabinol (THC) abuse 08/25/2016  . 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 Vitamin D deficiency 08/02/2016  . History of acute pyelonephritis 07/28/2016  . Allergy to shellfish 03/05/2013  . 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 GERD (gastroesophageal reflux disease) 03/05/2013    Past Surgical History:  Procedure Laterality Date  . BREAST LUMPECTOMY    . TOOTH EXTRACTION      OB History    Gravida  3   Para  2   Term  2   Preterm  0   AB  1   Living  2     SAB  1   TAB  0   Ectopic  0   Multiple  0   Live Births  2            Home Medications    Prior to Admission medications   Medication Sig Start Date End Date Taking? Authorizing Provider  acyclovir (ZOVIRAX) 400 MG tablet Take 1 tablet (400 mg total) by mouth 2 (two)  times daily. 12/09/17 01/08/18  Alene Miresmohundro, Jennifer C, NP  EPINEPHrine (AUVI-Q) 0.3 mg/0.3 mL IJ SOAJ injection Use as directed for severe allergic reaction. 11/22/17   Bobbitt, Heywood Ilesalph Carter, MD  famotidine (PEPCID) 20 MG tablet Take 1 tablet (20 mg total) by mouth 2 (two) times daily as needed. 11/22/17   Bobbitt, Heywood Ilesalph Carter, MD  fluticasone (FLONASE) 50 MCG/ACT nasal spray One spray each nostril one to two times a day as needed for nasal congestion or drainage. 11/22/17   Bobbitt, Heywood Ilesalph Carter, MD  HYDROcodone-Acetaminophen 10-325 MG/15ML SOLN Take 1 tablet by mouth every 8 (eight) hours as needed (pain).    [provider]  ibuprofen (ADVIL) 600 MG tablet Take 1 tablet (600 mg total) by mouth every 6 (six) hours as needed. 10/11/18   Alphonzo Devera, Britta MccreedyPhilip O, MD  levocetirizine (XYZAL) 5 MG tablet Take 1 tablet (5 mg total) by mouth every evening. 11/22/17   Bobbitt, Heywood Ilesalph Carter, MD  montelukast (SINGULAIR) 10 MG tablet Take 1 tablet (10 mg total) by mouth at bedtime. 11/22/17   Bobbitt, Heywood Ilesalph Carter, MD  Prenatal-DSS-FeCb-FeGl-FA [facility](CITRANATAL BLOOM) 90-1 MG TABS Take 1 tablet daily by mouth. 12/08/16   Orvilla Cornwallenney, Rachelle A, CNM  SUMAtriptan (IMITREX) 25 MG  tablet Please take 1 tablet at the outset of the headaches. May repeat in 2 hours if headache persists or recurs. 10/11/18   LampteyMyrene Galas, MD    Family History Family History  Problem Relation Age of Onset  . Hypertension Mother   . Allergic rhinitis Mother   . Diabetes Maternal Grandmother   . Anemia Maternal Grandmother   . Food Allergy Sister        shellfish allergy  . Angioedema Neg Hx   . Asthma Neg Hx   . Eczema Neg Hx   . Immunodeficiency Neg Hx   . Urticaria Neg Hx     Social History Social History   Tobacco Use  . Smoking status: Never Smoker  . Smokeless tobacco: Never Used  Substance Use Topics  . Alcohol use: No  . Drug use: Not Currently    Types: Marijuana    Comment: occasional but not recently      Allergies   Shellfish allergy   Review of Systems Review of Systems  Constitutional: Positive for activity change. Negative for fatigue and fever.  HENT: Negative for congestion, rhinorrhea, sinus pressure, sinus pain and sore throat.   Eyes: Negative for photophobia, pain, discharge, redness and visual disturbance.  Respiratory: Negative.   Cardiovascular: Negative.   Gastrointestinal: Negative.   Genitourinary: Negative.   Musculoskeletal: Negative.   Skin: Negative.   Neurological: Positive for headaches. Negative for dizziness, weakness and numbness.  Psychiatric/Behavioral: Negative for confusion, decreased concentration and dysphoric mood. The patient is not nervous/anxious.      Physical Exam Triage Vital Signs ED Triage Vitals  Enc Vitals Group     BP 10/11/18 0848 107/73     Pulse Rate 10/11/18 0848 78     Resp 10/11/18 0848 12     Temp 10/11/18 0848 98.9 F (37.2 C)     Temp Source 10/11/18 0848 Oral     SpO2 10/11/18 0848 100 %     Weight --      Height --      Head Circumference --      Peak Flow --      Pain Score 10/11/18 0847 0     Pain Loc --      Pain Edu? --      Excl. in [place]? --    No data found.  Updated Vital Signs BP 107/73 (BP Location: Right Arm)   Pulse 78   Temp 98.9 F (37.2 C) (Oral)   Resp 12   LMP 09/18/2018 (Approximate)   SpO2 100%   Visual Acuity Right Eye Distance:   Left Eye Distance:   Bilateral Distance:    Right Eye Near:   Left Eye Near:    Bilateral Near:     Physical Exam Vitals signs and nursing note reviewed.  Constitutional:      General: She is not in acute distress.    Appearance: She is not ill-appearing or toxic-appearing.  HENT:     Right Ear: Tympanic membrane and ear canal normal.     Left Ear: Tympanic membrane and ear canal normal.     Nose: Nose normal.     Mouth/Throat:     Mouth: Mucous membranes are moist.     Pharynx: No oropharyngeal exudate or posterior oropharyngeal erythema.  Eyes:      Extraocular Movements: Extraocular movements intact.     Conjunctiva/sclera: Conjunctivae normal.  Neck:     Musculoskeletal: Normal range of motion and neck supple.  Cardiovascular:  Rate and Rhythm: Normal rate and regular rhythm.     Pulses: Normal pulses.     Heart sounds: Normal heart sounds.  Pulmonary:     Effort: Pulmonary effort is normal.     Breath sounds: Normal breath sounds.  Abdominal:     General: Bowel sounds are normal.     Palpations: Abdomen is soft.  Musculoskeletal: Normal range of motion.  Skin:    Capillary Refill: Capillary refill takes less than 2 seconds.  Neurological:     General: No focal deficit present.     Mental Status: She is alert and oriented to person, place, and time. Mental status is at baseline.     Cranial Nerves: No cranial nerve deficit.     Motor: No weakness.     Gait: Gait normal.      UC Treatments / Results  Labs (all labs ordered are listed, but only abnormal results are displayed) Labs Reviewed - No data to display  EKG   Radiology No results found.  Procedures Procedures (including critical care time)  Medications Ordered in UC Medications  dexamethasone (DECADRON) injection 10 mg (has no administration in time range)  SUMAtriptan (IMITREX) injection 6 mg (has no administration in time range)  metoCLOPramide (REGLAN) injection 10 mg (has no administration in time range)  dexamethasone (DECADRON) 10 MG/ML injection (has no administration in time range)  metoCLOPramide (REGLAN) 5 MG/ML injection (has no administration in time range)  SUMAtriptan (IMITREX) 6 MG/0.5ML injection (has no administration in time range)    Initial Impression / Assessment and Plan / UC Course  I have reviewed the triage vital signs and the nursing notes.  Pertinent labs & imaging results that were available during my care of the patient were reviewed by me and considered in my medical decision making (see chart for details).      1.  Recurrent migraine, currently intractable: Dexamethasone 10 mg IM Reglan 10 mg IM Imitrex 6 mg subcu Patient is advised to follow-up with a neurologist for treatment of recurrent/persistent headaches Imitrex 25 mg to be taken at the outset of headaches Ibuprofen 600 mg every 6 as needed for headaches If patient's symptoms worsens she is advised to return to urgent care.  If the headache does not resolve in spite of aggressive care she may need brain imaging. Final Clinical Impressions(s) / UC Diagnoses   Final diagnoses:  Migraine with aura and with status migrainosus, not intractable   Discharge Instructions   None    ED Prescriptions    Medication Sig Dispense Auth. Provider   ibuprofen (ADVIL) 600 MG tablet Take 1 tablet (600 mg total) by mouth every 6 (six) hours as needed. 30 tablet Tabithia Stroder, Britta Mccreedy, MD   SUMAtriptan (IMITREX) 25 MG tablet Please take 1 tablet at the outset of the headaches. May repeat in 2 hours if headache persists or recurs. 20 tablet Tuere Nwosu, Britta Mccreedy, MD     Controlled Substance Prescriptions [place] Controlled Substance Registry consulted? No   Merrilee Jansky, MD 10/11/18 [phone]

## 2018-10-11 NOTE — ED Triage Notes (Addendum)
Pt reports that she has had headaches for years.  For the last few weeks she states she has been waking with a headache everyday that she states is a migraine.  She reports that 800 mg ibuprofen does help.  She states she has tried taking OTC ibuprofen but she states that does not work.

## 2018-10-15 ENCOUNTER — Emergency Department (HOSPITAL_COMMUNITY)
Admission: EM | Admit: 2018-10-15 | Discharge: 2018-10-15 | Disposition: A | Discharge status: Home / Self Care | Payer: PRIVATE HEALTH INSURANCE | Attending: Emergency Medicine | Admitting: Emergency Medicine

## 2018-10-15 ENCOUNTER — Other Ambulatory Visit: Payer: Self-pay

## 2018-10-15 DIAGNOSIS — Y92410 Unspecified street and highway as the place of occurrence of the external cause: Secondary | ICD-10-CM | POA: Diagnosis not present

## 2018-10-15 DIAGNOSIS — Z79899 Other long term (current) drug therapy: Secondary | ICD-10-CM | POA: Insufficient documentation

## 2018-10-15 DIAGNOSIS — R51 Headache: Secondary | ICD-10-CM | POA: Diagnosis present

## 2018-10-15 DIAGNOSIS — Y93I9 Activity, other involving external motion: Secondary | ICD-10-CM | POA: Insufficient documentation

## 2018-10-15 DIAGNOSIS — Y999 Unspecified external cause status: Secondary | ICD-10-CM | POA: Insufficient documentation

## 2018-10-15 MED ORDER — ACETAMINOPHEN 325 MG PO TABS
650.0000 mg | ORAL_TABLET | Freq: Once | ORAL | Status: AC
  Administered 2018-10-15: 20:00:00 650 mg via ORAL
  Filled 2018-10-15: qty 2

## 2018-10-15 MED ORDER — CYCLOBENZAPRINE HCL 5 MG PO TABS: 5.0000 mg | ORAL_TABLET | Freq: Two times a day (BID) | ORAL | 0 refills | Status: DC | PRN
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
## 2018-10-15 NOTE — Discharge Instructions (Signed)
Please read attached information. If you experience any new or worsening signs or symptoms please return to the emergency room for evaluation. Please follow-up with your primary care provider or specialist as discussed. Please use medication prescribed only as directed and discontinue taking if you have any concerning signs or symptoms.   °

## 2018-10-25 ENCOUNTER — Ambulatory Visit: Payer: BLUE CROSS/BLUE SHIELD | Admitting: Neurology

## 2018-12-03 ENCOUNTER — Ambulatory Visit: Payer: Medicaid Other | Admitting: Neurology

## 2018-12-04 ENCOUNTER — Encounter: Payer: Self-pay | Admitting: Neurology

## 2019-01-23 ENCOUNTER — Other Ambulatory Visit: Payer: Self-pay

## 2019-01-23 ENCOUNTER — Emergency Department: Payer: PRIVATE HEALTH INSURANCE

## 2019-01-23 DIAGNOSIS — Z20828 Contact with and (suspected) exposure to other viral communicable diseases: Secondary | ICD-10-CM | POA: Diagnosis present

## 2019-01-23 DIAGNOSIS — K219 Gastro-esophageal reflux disease without esophagitis: Secondary | ICD-10-CM | POA: Diagnosis present

## 2019-01-23 DIAGNOSIS — Z79899 Other long term (current) drug therapy: Secondary | ICD-10-CM

## 2019-01-23 DIAGNOSIS — Z8249 Family history of ischemic heart disease and other diseases of the circulatory system: Secondary | ICD-10-CM

## 2019-01-23 DIAGNOSIS — R112 Nausea with vomiting, unspecified: Principal | ICD-10-CM | POA: Diagnosis present

## 2019-01-23 DIAGNOSIS — Z91013 Allergy to seafood: Secondary | ICD-10-CM

## 2019-01-23 DIAGNOSIS — J302 Other seasonal allergic rhinitis: Secondary | ICD-10-CM | POA: Diagnosis present

## 2019-01-23 DIAGNOSIS — F1210 Cannabis abuse, uncomplicated: Secondary | ICD-10-CM | POA: Diagnosis present

## 2019-01-23 LAB — COMPREHENSIVE METABOLIC PANEL
ALT: 501 U/L — ABNORMAL HIGH (ref 0–44)
AST: 612 U/L — ABNORMAL HIGH (ref 15–41)
Albumin: 4.5 g/dL (ref 3.5–5.0)
Alkaline Phosphatase: 106 U/L (ref 38–126)
Anion gap: 10 (ref 5–15)
BUN: 9 mg/dL (ref 6–20)
CO2: 27 mmol/L (ref 22–32)
Calcium: 9.6 mg/dL (ref 8.9–10.3)
Chloride: 105 mmol/L (ref 98–111)
Creatinine, Ser: 0.65 mg/dL (ref 0.44–1.00)
GFR calc Af Amer: >60 mL/min (ref >60)
GFR calc non Af Amer: >60 mL/min (ref >60)
Glucose, Bld: 114 mg/dL — ABNORMAL HIGH (ref 70–99)
Potassium: 3.7 mmol/L (ref 3.5–5.1)
Sodium: 142 mmol/L (ref 135–145)
Total Bilirubin: 2.8 mg/dL — ABNORMAL HIGH (ref 0.3–1.2)
Total Protein: 8 g/dL (ref 6.5–8.1)

## 2019-01-23 LAB — CBC
HCT: 43.2 % (ref 36.0–46.0)
Hemoglobin: 13.1 g/dL (ref 12.0–15.0)
MCH: 22.9 pg — ABNORMAL LOW (ref 26.0–34.0)
MCHC: 30.3 g/dL (ref 30.0–36.0)
MCV: 75.4 fL — ABNORMAL LOW (ref 80.0–100.0)
Platelets: 340 10*3/uL (ref 150–400)
RBC: 5.73 MIL/uL — ABNORMAL HIGH (ref 3.87–5.11)
RDW: 13.3 % (ref 11.5–15.5)
WBC: 6.2 10*3/uL (ref 4.0–10.5)
nRBC: 0 % (ref 0.0–0.2)

## 2019-01-23 LAB — LIPASE, BLOOD: Lipase: 34 U/L (ref 11–51)

## 2019-01-23 MED ORDER — ONDANSETRON 4 MG PO TBDP
ORAL_TABLET | ORAL | Status: AC
  Filled 2019-01-23: qty 1

## 2019-01-23 MED ORDER — SODIUM CHLORIDE 0.9% FLUSH: 3.0000 mL | Freq: Once | INTRAVENOUS | Status: DC

## 2019-01-23 MED ORDER — ONDANSETRON 4 MG PO TBDP
4.0000 mg | ORAL_TABLET | Freq: Once | ORAL | Status: AC | PRN
  Administered 2019-01-23: 4 mg via ORAL

## 2019-01-23 NOTE — ED Triage Notes (Signed)
Pt states at 0300 she felt a knot in her stomach and she became nauseated and hand vomiting. Pt states she has been having nausea and vomiting all day. Pt is not in any acute distress. Pt states she ate at [employer] last night and does not believe the chicken was cooked all the way.

## 2019-01-23 NOTE — ED Notes (Signed)
To u/s via w/c accomp by u/s tech 

## 2019-01-24 ENCOUNTER — Emergency Department: Payer: PRIVATE HEALTH INSURANCE

## 2019-01-24 ENCOUNTER — Encounter: Payer: Self-pay | Admitting: Radiology

## 2019-01-24 ENCOUNTER — Inpatient Hospital Stay
Admission: EM | Admit: 2019-01-24 | Discharge: 2019-01-24 | DRG: 392 | Disposition: A | Discharge status: Home / Self Care | Payer: PRIVATE HEALTH INSURANCE | Attending: Internal Medicine | Admitting: Internal Medicine

## 2019-01-24 DIAGNOSIS — G43009 Migraine without aura, not intractable, without status migrainosus: Secondary | ICD-10-CM

## 2019-01-24 DIAGNOSIS — J302 Other seasonal allergic rhinitis: Secondary | ICD-10-CM | POA: Diagnosis present

## 2019-01-24 DIAGNOSIS — Z91013 Allergy to seafood: Secondary | ICD-10-CM | POA: Diagnosis not present

## 2019-01-24 DIAGNOSIS — R1011 Right upper quadrant pain: Secondary | ICD-10-CM

## 2019-01-24 DIAGNOSIS — Z79899 Other long term (current) drug therapy: Secondary | ICD-10-CM | POA: Diagnosis not present

## 2019-01-24 DIAGNOSIS — B179 Acute viral hepatitis, unspecified: Secondary | ICD-10-CM

## 2019-01-24 DIAGNOSIS — Z8249 Family history of ischemic heart disease and other diseases of the circulatory system: Secondary | ICD-10-CM | POA: Diagnosis not present

## 2019-01-24 DIAGNOSIS — R112 Nausea with vomiting, unspecified: Principal | ICD-10-CM

## 2019-01-24 DIAGNOSIS — K8020 Calculus of gallbladder without cholecystitis without obstruction: Secondary | ICD-10-CM

## 2019-01-24 DIAGNOSIS — R7989 Other specified abnormal findings of blood chemistry: Secondary | ICD-10-CM | POA: Diagnosis not present

## 2019-01-24 DIAGNOSIS — Z20828 Contact with and (suspected) exposure to other viral communicable diseases: Secondary | ICD-10-CM | POA: Diagnosis present

## 2019-01-24 DIAGNOSIS — K219 Gastro-esophageal reflux disease without esophagitis: Secondary | ICD-10-CM | POA: Diagnosis present

## 2019-01-24 DIAGNOSIS — F1210 Cannabis abuse, uncomplicated: Secondary | ICD-10-CM | POA: Diagnosis present

## 2019-01-24 LAB — CBC
HCT: 42.1 % (ref 36.0–46.0)
Hemoglobin: 12.8 g/dL (ref 12.0–15.0)
MCH: 22.7 pg — ABNORMAL LOW (ref 26.0–34.0)
MCHC: 30.4 g/dL (ref 30.0–36.0)
MCV: 74.8 fL — ABNORMAL LOW (ref 80.0–100.0)
Platelets: 341 10*3/uL (ref 150–400)
RBC: 5.63 MIL/uL — ABNORMAL HIGH (ref 3.87–5.11)
RDW: 13.2 % (ref 11.5–15.5)
WBC: 6.8 10*3/uL (ref 4.0–10.5)
nRBC: 0 % (ref 0.0–0.2)

## 2019-01-24 LAB — HEPATITIS PANEL, ACUTE
HCV Ab: NONREACTIVE
HCV Ab: NONREACTIVE
Hep A IgM: NONREACTIVE
Hep A IgM: NONREACTIVE
Hep B C IgM: NONREACTIVE
Hep B C IgM: NONREACTIVE
Hepatitis B Surface Ag: NONREACTIVE
Hepatitis B Surface Ag: NONREACTIVE

## 2019-01-24 LAB — HIV ANTIBODY (ROUTINE TESTING W REFLEX): HIV Screen 4th Generation wRfx: NONREACTIVE

## 2019-01-24 LAB — HEPATIC FUNCTION PANEL
ALT: 510 U/L — ABNORMAL HIGH (ref 0–44)
AST: 493 U/L — ABNORMAL HIGH (ref 15–41)
Albumin: 4.6 g/dL (ref 3.5–5.0)
Alkaline Phosphatase: 128 U/L — ABNORMAL HIGH (ref 38–126)
Bilirubin, Direct: 1 mg/dL — ABNORMAL HIGH (ref 0.0–0.2)
Indirect Bilirubin: 1.6 mg/dL — ABNORMAL HIGH (ref 0.3–0.9)
Total Bilirubin: 2.6 mg/dL — ABNORMAL HIGH (ref 0.3–1.2)
Total Protein: 8.1 g/dL (ref 6.5–8.1)

## 2019-01-24 LAB — COMPREHENSIVE METABOLIC PANEL
ALT: 439 U/L — ABNORMAL HIGH (ref 0–44)
AST: 335 U/L — ABNORMAL HIGH (ref 15–41)
Albumin: 4.4 g/dL (ref 3.5–5.0)
Alkaline Phosphatase: 126 U/L (ref 38–126)
Anion gap: 12 (ref 5–15)
BUN: 8 mg/dL (ref 6–20)
CO2: 25 mmol/L (ref 22–32)
Calcium: 9.1 mg/dL (ref 8.9–10.3)
Chloride: 104 mmol/L (ref 98–111)
Creatinine, Ser: 0.56 mg/dL (ref 0.44–1.00)
GFR calc Af Amer: >60 mL/min (ref >60)
GFR calc non Af Amer: >60 mL/min (ref >60)
Glucose, Bld: 96 mg/dL (ref 70–99)
Potassium: 3.4 mmol/L — ABNORMAL LOW (ref 3.5–5.1)
Sodium: 141 mmol/L (ref 135–145)
Total Bilirubin: 2 mg/dL — ABNORMAL HIGH (ref 0.3–1.2)
Total Protein: 7.6 g/dL (ref 6.5–8.1)

## 2019-01-24 LAB — URINALYSIS, COMPLETE (UACMP) WITH MICROSCOPIC
Glucose, UA: NEGATIVE mg/dL
Hgb urine dipstick: NEGATIVE
Ketones, ur: 20 mg/dL — AB
Leukocytes,Ua: NEGATIVE
Nitrite: NEGATIVE
Protein, ur: 30 mg/dL — AB
Specific Gravity, Urine: 1.025 (ref 1.005–1.030)
pH: 6 (ref 5.0–8.0)

## 2019-01-24 LAB — SARS CORONAVIRUS 2 (TAT 6-24 HRS): SARS Coronavirus 2: NEGATIVE

## 2019-01-24 LAB — POCT PREGNANCY, URINE: Preg Test, Ur: NEGATIVE

## 2019-01-24 MED ORDER — CYCLOBENZAPRINE HCL 10 MG PO TABS: 5.0000 mg | ORAL_TABLET | Freq: Two times a day (BID) | ORAL | Status: DC | PRN

## 2019-01-24 MED ORDER — ACETAMINOPHEN 325 MG PO TABS: 650.0000 mg | ORAL_TABLET | Freq: Four times a day (QID) | ORAL | Status: DC | PRN

## 2019-01-24 MED ORDER — FLUTICASONE PROPIONATE 50 MCG/ACT NA SUSP: 2.0000 | Freq: Every day | NASAL | Status: DC

## 2019-01-24 MED ORDER — IOHEXOL 300 MG/ML  SOLN
100.0000 mL | Freq: Once | INTRAMUSCULAR | Status: AC | PRN
  Administered 2019-01-24: 01:00:00 100 mL via INTRAVENOUS
  Filled 2019-01-24: qty 100

## 2019-01-24 MED ORDER — CITRANATAL BLOOM 90-1 MG PO TABS: 1.0000 | ORAL_TABLET | Freq: Every day | ORAL | Status: DC

## 2019-01-24 MED ORDER — TRAZODONE HCL 50 MG PO TABS
25.0000 mg | ORAL_TABLET | Freq: Every evening | ORAL | Status: DC | PRN
  Filled 2019-01-24: qty 0.5

## 2019-01-24 MED ORDER — ONDANSETRON HCL 4 MG PO TABS: 4.0000 mg | ORAL_TABLET | Freq: Four times a day (QID) | ORAL | Status: DC | PRN

## 2019-01-24 MED ORDER — HYDROCODONE-ACETAMINOPHEN 5-325 MG PO TABS: 1.0000 | ORAL_TABLET | ORAL | Status: DC | PRN

## 2019-01-24 MED ORDER — ACETAMINOPHEN 650 MG RE SUPP: 650.0000 mg | Freq: Four times a day (QID) | RECTAL | Status: DC | PRN

## 2019-01-24 MED ORDER — LEVOCETIRIZINE DIHYDROCHLORIDE 5 MG PO TABS: 5.0000 mg | ORAL_TABLET | Freq: Every evening | ORAL | Status: DC

## 2019-01-24 MED ORDER — SODIUM CHLORIDE 0.9 % IV SOLN: INTRAVENOUS | Status: DC

## 2019-01-24 MED ORDER — MAGNESIUM HYDROXIDE 400 MG/5ML PO SUSP: 30.0000 mL | Freq: Every day | ORAL | Status: DC | PRN

## 2019-01-24 MED ORDER — MONTELUKAST SODIUM 10 MG PO TABS: 10.0000 mg | ORAL_TABLET | Freq: Every day | ORAL | Status: DC

## 2019-01-24 MED ORDER — ONDANSETRON HCL 4 MG/2ML IJ SOLN: 4.0000 mg | Freq: Four times a day (QID) | INTRAMUSCULAR | Status: DC | PRN

## 2019-01-24 MED ORDER — ENOXAPARIN SODIUM 40 MG/0.4ML ~~LOC~~ SOLN: 40.0000 mg | SUBCUTANEOUS | Status: DC

## 2019-01-24 MED ORDER — FAMOTIDINE 20 MG PO TABS: 20.0000 mg | ORAL_TABLET | Freq: Two times a day (BID) | ORAL | Status: DC

## 2019-01-24 MED ORDER — ONDANSETRON HCL 4 MG PO TABS: 4.0000 mg | ORAL_TABLET | Freq: Four times a day (QID) | ORAL | 0 refills | Status: DC | PRN

## 2019-01-24 MED ORDER — POTASSIUM CHLORIDE CRYS ER 20 MEQ PO TBCR
40.0000 meq | EXTENDED_RELEASE_TABLET | Freq: Once | ORAL | Status: AC
  Administered 2019-01-24: 40 meq via ORAL
  Filled 2019-01-24: qty 2

## 2019-01-24 NOTE — ED Notes (Signed)
Pt c/o of "hot flashes" since 1 week ago, pt denies fever/cough.

## 2019-01-24 NOTE — H&P (Signed)
[facility] [place] NAME: Debbie Mercer    MR#:  [id number]  DATE OF BIRTH:  1995/05/09  DATE OF ADMISSION:  01/24/2019  PRIMARY CARE PHYSICIAN: Patient, No Pcp Per   REQUESTING/REFERRING PHYSICIAN: Marjean Donna, MD  CHIEF COMPLAINT:   Chief Complaint  Patient presents with  . Abdominal Pain    HISTORY OF PRESENT ILLNESS:  Debbie Mercer  is a 23 y.o. female with a no chronic medical problems, who presented to the emergency room with acute onset of intractable nausea and vomiting since yesterday morning with no diarrhea.  She had mild lower abdominal and right upper quadrant pain earlier however and currently has tenderness.  She denied any bilious vomitus or hematemesis.  She stated that she ate at [employer] heart chicken wings a couple of days ago and thought they were undercooked.  She started having body aches with backache that improved when she went to work when she continued to have nausea and vomiting and therefore came to the ER.  She had a soft bowel movement earlier today per her report.  She denies any fever or chills.  No dysuria, oliguria or hematuria or flank pain.  No cough or wheezing.  No exposure to COVID-19.  Upon presentation to the emergency room, vital signs were within normal.  Labs revealed CMP remarkable for elevated AST 612 and ALT 501 with total bili of 2.8 and CBC was unremarkable.  Urine pregnancy test was negative.  Right upper quadrant ultrasound showed gallstones without acute cholecystitis.  There were no biliary dilations.  [place] pelvic CT scan revealed small gallstones without right upper quadrant inflammatory change to suggest acute cholecystitis.  There was suspicion of mild terminal ileal thickening and mild wall thickening of the cecum, ascending colon, hepatic flexure and portions of the transverse colon that may be seen with enterocolitis that could be infectious or inflammatory bowel disease.  The patient was given 4  mg of IV Zofran.  She will be admitted to a medical bed for further evaluation and manage PAST MEDICAL HISTORY:   Past Medical History:  Diagnosis Date  . Medical history non-contributory   Seasonal allergies Migraine headache  PAST SURGICAL HISTORY:   Past Surgical History:  Procedure Laterality Date  . BREAST LUMPECTOMY    . TOOTH EXTRACTION      SOCIAL HISTORY:   Social History   Tobacco Use  . Smoking status: Never Smoker  . Smokeless tobacco: Never Used  Substance Use Topics  . Alcohol use: No    FAMILY HISTORY:   Family History  Problem Relation Age of Onset  . Hypertension Mother   . Allergic rhinitis Mother   . Diabetes Maternal Grandmother   . Anemia Maternal Grandmother   . Food Allergy Sister        shellfish allergy  . Angioedema Neg Hx   . Asthma Neg Hx   . Eczema Neg Hx   . Immunodeficiency Neg Hx   . Urticaria Neg Hx     DRUG ALLERGIES:   Allergies  Allergen Reactions  . Shellfish Allergy Anaphylaxis and Itching    REVIEW OF SYSTEMS:   ROS As per history of present illness. All pertinent systems were reviewed above. Constitutional,  HEENT, cardiovascular, respiratory, GI, GU, musculoskeletal, neuro, psychiatric, endocrine,  integumentary and hematologic systems were reviewed and are otherwise  negative/unremarkable except for positive findings mentioned above in the HPI.   MEDICATIONS AT HOME:   Prior to Admission medications  Medication Sig Start Date End Date Taking? Authorizing Provider  cyclobenzaprine (FLEXERIL) 5 MG tablet Take 1 tablet (5 mg total) by mouth 2 (two) times daily as needed for muscle spasms. 10/15/18  Yes Hedges, Dellis Filbert, PA-C  EPINEPHrine (AUVI-Q) 0.3 mg/0.3 mL IJ SOAJ injection Use as directed for severe allergic reaction. 11/22/17  Yes Bobbitt, Sedalia Muta, MD  ibuprofen (ADVIL) 600 MG tablet Take 1 tablet (600 mg total) by mouth every 6 (six) hours as needed. 10/11/18  Yes Lamptey, Myrene Galas, MD  acyclovir  (ZOVIRAX) 400 MG tablet Take 1 tablet (400 mg total) by mouth 2 (two) times daily. 12/09/17 01/08/18  Jacqualine Mau, NP  famotidine (PEPCID) 20 MG tablet Take 1 tablet (20 mg total) by mouth 2 (two) times daily as needed. Patient not taking: Reported on 01/24/2019 11/22/17   Bobbitt, Sedalia Muta, MD  fluticasone [facility]) 50 MCG/ACT nasal spray One spray each nostril one to two times a day as needed for nasal congestion or drainage. Patient not taking: Reported on 01/24/2019 11/22/17   Bobbitt, Sedalia Muta, MD  HYDROcodone-Acetaminophen 10-325 MG/15ML SOLN Take 1 tablet by mouth every 8 (eight) hours as needed (pain).    [provider]  levocetirizine (XYZAL) 5 MG tablet Take 1 tablet (5 mg total) by mouth every evening. Patient not taking: Reported on 01/24/2019 11/22/17   Bobbitt, Sedalia Muta, MD  montelukast (SINGULAIR) 10 MG tablet Take 1 tablet (10 mg total) by mouth at bedtime. Patient not taking: Reported on 01/24/2019 11/22/17   Bobbitt, Sedalia Muta, MD  Prenatal-DSS-FeCb-FeGl-FA (CITRANATAL BLOOM) 90-1 MG TABS Take 1 tablet daily by mouth. Patient not taking: Reported on 01/24/2019 12/08/16   Kandis Cocking A, CNM  SUMAtriptan (IMITREX) 25 MG tablet Please take 1 tablet at the outset of the headaches. May repeat in 2 hours if headache persists or recurs. Patient not taking: Reported on 01/24/2019 10/11/18   Chase Picket, MD      VITAL SIGNS:  Blood pressure 112/79, pulse 63, temperature 98.2 F (36.8 C), temperature source Oral, resp. rate 18, height _0  (1.6 m), weight 88.5 kg, SpO2 98 %, currently breastfeeding.  PHYSICAL EXAMINATION:  Physical Exam  GENERAL:  23 y.o.-year-old African-American female patient lying in the bed with no acute distress.  EYES: Pupils equal, round, reactive to light and accommodation. No scleral icterus. Extraocular muscles intact.  HEENT: Head atraumatic, normocephalic. Oropharynx and nasopharynx clear.  NECK:  Supple,  no jugular venous distention. No thyroid enlargement, no tenderness.  LUNGS: Normal breath sounds bilaterally, no wheezing, rales,rhonchi or crepitation. No use of accessory muscles of respiration.  CARDIOVASCULAR: Regular rate and rhythm, S1, S2 normal. No murmurs, rubs, or gallops.  ABDOMEN: Soft with mild right upper quadrant tenderness without rebound tenderness guarding or rigidity.  Bowel sounds present. No organomegaly or mass.  EXTREMITIES: No pedal edema, cyanosis, or clubbing.  NEUROLOGIC: Cranial nerves II through XII are intact. Muscle strength 5/5 in all extremities. Sensation intact. Gait not checked.  PSYCHIATRIC: The patient is alert and oriented x 3.  Normal affect and good eye contact. SKIN: No obvious rash, lesion, or ulcer.   LABORATORY PANEL:   CBC Recent Labs  Lab 01/23/19 2021  WBC 6.2  HGB 13.1  HCT 43.2  PLT 340   ------------------------------------------------------------------------------------------------------------------  Chemistries  Recent Labs  Lab 01/23/19 2021 01/24/19 0026  NA 142  --   K 3.7  --   CL 105  --   CO2 27  --   GLUCOSE  114*  --   BUN 9  --   CREATININE 0.65  --   CALCIUM 9.6  --   AST 612* 493*  ALT 501* 510*  ALKPHOS 106 128*  BILITOT 2.8* 2.6*   ------------------------------------------------------------------------------------------------------------------  Cardiac Enzymes No results for input(s): TROPONINI in the last 168 hours. ------------------------------------------------------------------------------------------------------------------  RADIOLOGY:  CT ABDOMEN PELVIS W CONTRAST  Result Date: 01/24/2019 CLINICAL DATA:  Right upper quadrant pain EXAM: CT ABDOMEN AND PELVIS WITH CONTRAST TECHNIQUE: Multidetector CT imaging of the abdomen and pelvis was performed using the standard protocol following bolus administration of intravenous contrast. CONTRAST:  1103m OMNIPAQUE IOHEXOL 300 MG/ML  SOLN COMPARISON:   Ultrasound 01/23/2019 FINDINGS: Lower chest: No acute abnormality. Hepatobiliary: Small gallstones. No focal hepatic abnormality. Negative for biliary dilatation Pancreas: Unremarkable. No pancreatic ductal dilatation or surrounding inflammatory changes. Spleen: Normal in size without focal abnormality. Adrenals/Urinary Tract: Adrenal glands are unremarkable. Kidneys are normal, without renal calculi, focal lesion, or hydronephrosis. Bladder is unremarkable. Stomach/Bowel: The stomach is nonenlarged. No dilated small bowel. Negative appendix. Slightly thickened appearance of the terminal ileum and ileocecal junction. Suspected mild wall thickening of the cecum ascending colon and hepatic flexure. Possible mild wall thickening at the transverse colon. Mild diffuse diverticular disease of the colon. Vascular/Lymphatic: No significant vascular findings are present. No enlarged abdominal or pelvic lymph nodes. Reproductive: Uterus and bilateral adnexa are unremarkable. Other: Negative for free air or free fluid Musculoskeletal: No acute or significant osseous findings. IMPRESSION: 1. Negative for acute appendicitis. 2. Small gallstones without right upper quadrant inflammatory change to suggest acute gallbladder inflammation 3. Suspicion of mild terminal ileal thickening and mild wall thickening of the cecum, ascending colon, hepatic flexure and portions of the transverse colon as may be seen with enterocolitis. This could be secondary to infection or inflammatory bowel disease. Electronically Signed   By: KDonavan FoilM.D.   On: 01/24/2019 01:46   UKoreaAbdomen Limited RUQ  Result Date: 01/23/2019 CLINICAL DATA:  Initial evaluation for acute right upper quadrant pain with nausea, vomiting. EXAM: ULTRASOUND ABDOMEN LIMITED RIGHT UPPER QUADRANT COMPARISON:  None. FINDINGS: Gallbladder: Multiple echogenic stones seen within the gallbladder lumen, largest of which measures 7 mm. Gallbladder wall measure within normal  limits at 2.6 mm. No free pericholecystic fluid. No sonographic Murphy sign elicited on exam. Common bile duct: Diameter: 4.4 mm Liver: No focal lesion identified. Within normal limits in parenchymal echogenicity. Portal vein is patent on color Doppler imaging with normal direction of blood flow towards the liver. Other: None. IMPRESSION: 1. Cholelithiasis. No sonographic features to suggest acute cholecystitis. 2. No biliary dilatation. Electronically Signed   By: BJeannine BogaM.D.   On: 01/23/2019 23:18      IMPRESSION AND PLAN:   1.  Acute hepatitis with right upper quadrant abdominal pain.  Patient will be admitted to a medical bed.  She will be hydrated with IV normal saline.  We will follow her LFTs.  Will follow acute viral hepatitis panel that was sent in the ER as this could be related to hepatitis A.  We will follow her Covid 19 PCR.  She had normal lipase and normal alk phos.  2.  Intractable nausea and vomiting.  The patient will be placed on as needed antiemetics and hydrated as mentioned above.  If she has diarrhea will obtain stool studies.  3.  Migraine headache.  No current attack.  4.  DVT prophylaxis.  Subcutaneous Lovenox.    All the records are reviewed  and case discussed with ED provider. The plan of care was discussed in details with the patient (and family). I answered all questions. The patient agreed to proceed with the above mentioned plan. Further management will depend upon hospital course.   CODE STATUS: Full code  TOTAL TIME TAKING CARE OF THIS PATIENT: 50 minutes.    Christel Mormon M.D on 01/24/2019 at 4:52 AM  [facility]   From 7 PM-7 AM, contact night-coverage [email or web address]  CC: Primary care physician; Patient, No Pcp Per   Note: This dictation was prepared with Dragon dictation along with smaller phrase technology. Any transcriptional errors that result from this process are unintentional.

## 2019-01-24 NOTE — TOC Initial Note (Signed)
Transition of Care [facility]) - Initial/Assessment Note    Patient Details  Name: Debbie Mercer MRN: [id number] Date of Birth: 21-Feb-1995  Transition of Care [facility]) CM/SW Contact:    Shelbie Ammons, RN Phone Number: 01/24/2019, 4:13 PM  Clinical Narrative:      RNCM assessment completed at bedside. Patient up ambulating in room. Patient reports to feeling better today and reports she just wanted information regarding setting up MD. Did leave patient with information regarding setting up PCP with [facility]. Patient reports that at this point she is unsure if she wants to get MD in [place] or [place]. Discussed that she could contact Medicaid case worker to follow up as well.    Expected Discharge Plan: Home/Self Care Barriers to Discharge: No Barriers Identified   Patient Goals and CMS Choice        Expected Discharge Plan and Services Expected Discharge Plan: Home/Self Care       Living arrangements for the past 2 months: Single Family Home Expected Discharge Date: 01/24/19                                    Prior Living Arrangements/Services Living arrangements for the past 2 months: Single Family Home Lives with:: Self, Parents Patient language and need for interpreter reviewed:: Yes Do you feel safe going back to the place where you live?: Yes      Need for Family Participation in Patient Care: No (Comment) Care giver support system in place?: No (comment)   Criminal Activity/Legal Involvement Pertinent to Current Situation/Hospitalization: No - Comment as needed  Activities of Daily Living Home Assistive Devices/Equipment: None ADL Screening (condition at time of admission) Patient's cognitive ability adequate to safely complete daily activities?: Yes Is the patient deaf or have difficulty hearing?: No Does the patient have difficulty seeing, even when wearing glasses/contacts?: No Does the patient have difficulty concentrating,  remembering, or making decisions?: No Patient able to express need for assistance with ADLs?: Yes Does the patient have difficulty dressing or bathing?: No Independently performs ADLs?: Yes (appropriate for developmental age) Does the patient have difficulty walking or climbing stairs?: No Weakness of Legs: None Weakness of Arms/Hands: None  Permission Sought/Granted                  Emotional Assessment Appearance:: Appears stated age Attitude/Demeanor/Rapport: Engaged Affect (typically observed): Appropriate Orientation: : Oriented to Self, Oriented to Place, Oriented to  Time, Oriented to Situation   Psych Involvement: No (comment)  Admission diagnosis:  Acute hepatitis [B17.9] RUQ pain [R10.11] Calculus of gallbladder without cholecystitis without obstruction [K80.20] Patient Active Problem List   Diagnosis Date Noted  . Acute hepatitis 01/24/2019  . Nausea and vomiting   . Elevated liver function tests   . Recurrent urticaria 11/22/2017  . Angioedema 11/22/2017  . Allergic reaction 11/22/2017  . Seasonal allergic rhinitis 11/22/2017  . Mild tetrahydrocannabinol (THC) abuse 08/25/2016  . 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 Vitamin D deficiency 08/02/2016  . History of acute pyelonephritis 07/28/2016  . Allergy to shellfish 03/05/2013  . 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 GERD (gastroesophageal reflux disease) 03/05/2013   PCP:  Patient, No Pcp Per Pharmacy:   [facility] [place] - [address] [place] [address] Phone: [phone] Fax: [phone]  Social Determinants of Health (SDOH) Interventions    Readmission Risk Interventions No flowsheet data found.

## 2019-01-24 NOTE — Consult Note (Signed)
GI Inpatient Consult Note  Reason for Consult: Intractable nausea/vomiting, elevated LFTs, acute hepatitis    Attending Requesting Consult: Dr. Loletha Grayer, MD  History of Present Illness: Debbie Mercer is a 23 y.o. female seen for evaluation of intractable nausea and vomiting and elevated LFTs at the request of Dr. Loletha Grayer, MD. Patient has no significant PMH. She presented to the [facility] ED with acute onset of intractable nausea and vomiting over a 24 hour period. She reports she ate dinner at [employer] on [address] in [place], [place] around [place] on Tuesday night. She was eating boneless chicken tenders and noticed when she was eating them, that they were not cooked fully through. She brought this up to her waitress but no further actions were taken. She reports she was woken up around 3AM with diffuse abdominal pain with intractable nausea and vomiting. She reports there was no bilious emesis or hematemesis. She denies any diarrhea. She reports she has had one formed BM since her symptoms started with a formed consistency. She attempted to go to work the following morning, but continued to have persistent vomiting with nausea with bilateral upper abdominal pain so she presented to the ED. Upon presentation to the ED, AST 612, ALT 501, total bili 2.8 (indirect component 1.6), alk phos 128, WBC 6.2, normal lipase, negative pregnancy test. She had RUQ US showing cholelithiasis with no sonographic features to suggest acute cholecystitis, no biliary ductal dilatation. CT abd/pelvis was performed showing small gallstones wo RUQ inflammatory changes, there was suspicion of mild terminal ileum thickening and mild wall thickening of the cecum, ascending colon, hepatic flexure, and portions of the transverse colon as may be seen with enterocolitis. Acute hepatitis panel was negative - Hep B surface antigen negative, HCV Ab negative, Hep A IgM negative, and Hep B core IgM negative. She was COVID-19  negative. She denies any previous history of elevated liver enzymes. She denies any known family history of liver disease. No recent antibiotic exposure. She denies any sick contacts or recent travel. She denies any IVDU, new needlesticks, or prior history of blood transfusions. She denies any herbal supplements or dietary supplements. She does not consume products with mushrooms. There is no alcohol history. She last smoked marijuana three days ago.   Patient was seen and examined resting comfortably in hospital bed this afternoon. She is feeling better today and has not had any emesis since arrival to the ED. She reports Zofran has been effective in controlling the nausea. She was able to eat some fruit punch, mashed potatoes, beef for lunch today. She reports she does have an appetite and is pleased with her progress. No bowel movements since admission. She denies any abdominal pain. She denies fevers, chills, night sweats.    Last Colonoscopy: N/A Last Endoscopy: N/A   Past Medical History:  Past Medical History:  Diagnosis Date  . Medical history non-contributory     Problem List: Patient Active Problem List   Diagnosis Date Noted  . Acute hepatitis 01/24/2019  . Recurrent urticaria 11/22/2017  . Angioedema 11/22/2017  . Allergic reaction 11/22/2017  . Seasonal allergic rhinitis 11/22/2017  . Mild tetrahydrocannabinol (THC) abuse 08/25/2016  . 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 Vitamin D deficiency 08/02/2016  . History of acute pyelonephritis 07/28/2016  . Allergy to shellfish 03/05/2013  . 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 GERD (gastroesophageal reflux disease) 03/05/2013    Past Surgical History: Past Surgical History:  Procedure Laterality Date  . BREAST LUMPECTOMY    . TOOTH EXTRACTION      Allergies: Allergies  Allergen Reactions  . Shellfish Allergy Anaphylaxis and Itching    Home Medications: Medications  Prior to Admission  Medication Sig Dispense Refill Last Dose  . cyclobenzaprine (FLEXERIL) 5 MG tablet Take 1 tablet (5 mg total) by mouth 2 (two) times daily as needed for muscle spasms. 20 tablet 0 prn at prn  . EPINEPHrine (AUVI-Q) 0.3 mg/0.3 mL IJ SOAJ injection Use as directed for severe allergic reaction. 4 Device 1 prn at prn  . ibuprofen (ADVIL) 600 MG tablet Take 1 tablet (600 mg total) by mouth every 6 (six) hours as needed. 30 tablet 0 prn at prn  . acyclovir (ZOVIRAX) 400 MG tablet Take 1 tablet (400 mg total) by mouth 2 (two) times daily. 60 tablet 0   . famotidine (PEPCID) 20 MG tablet Take 1 tablet (20 mg total) by mouth 2 (two) times daily as needed. (Patient not taking: Reported on 01/24/2019) 60 tablet 5 Not Taking at Unknown time  . fluticasone (FLONASE) 50 MCG/ACT nasal spray One spray each nostril one to two times a day as needed for nasal congestion or drainage. (Patient not taking: Reported on 01/24/2019) 16 g 5 Not Taking at Unknown time  . HYDROcodone-Acetaminophen 10-325 MG/15ML SOLN Take 1 tablet by mouth every 8 (eight) hours as needed (pain).   Not Taking at Unknown time  . levocetirizine (XYZAL) 5 MG tablet Take 1 tablet (5 mg total) by mouth every evening. (Patient not taking: Reported on 01/24/2019) 30 tablet 5 Not Taking at Unknown time  . montelukast (SINGULAIR) 10 MG tablet Take 1 tablet (10 mg total) by mouth at bedtime. (Patient not taking: Reported on 01/24/2019) 30 tablet 5 Not Taking at Unknown time  . Prenatal-DSS-FeCb-FeGl-FA (CITRANATAL BLOOM) 90-1 MG TABS Take 1 tablet daily by mouth. (Patient not taking: Reported on 01/24/2019) 30 tablet 12 Not Taking at Unknown time  . SUMAtriptan (IMITREX) 25 MG tablet Please take 1 tablet at the outset of the headaches. May repeat in 2 hours if headache persists or recurs. (Patient not taking: Reported on 01/24/2019) 20 tablet 0 Not Taking at Unknown time   Home medication reconciliation was completed with the patient.    Scheduled Inpatient Medications:   . enoxaparin (LOVENOX) injection  40 mg Subcutaneous Q24H    Continuous Inpatient Infusions:   . sodium chloride 100 mL/hr at 01/24/19 0415    PRN Inpatient Medications:  acetaminophen **OR** acetaminophen, cyclobenzaprine, magnesium hydroxide, ondansetron **OR** ondansetron (ZOFRAN) IV, traZODone  Family History: family history includes Allergic rhinitis in her mother; Anemia in her maternal grandmother; Diabetes in her maternal grandmother; Food Allergy in her sister; Hypertension in her mother.  The patient's family history is negative for inflammatory bowel disorders, GI malignancy, or solid organ transplantation.  Social History:   reports that she has never smoked. She has never used smokeless tobacco. She reports previous drug use. Drug: Marijuana. She reports that she does not drink alcohol. The patient denies ETOH, tobacco, or drug use.   Review of Systems: Constitutional: Weight is stable.  Eyes: No changes in vision. ENT: No oral lesions, sore throat.  GI: see HPI.  Heme/Lymph: No easy bruising.  CV: No chest pain.  GU: No hematuria.  Integumentary: No rashes.  Neuro: No headaches.  Psych: No depression/anxiety.  Endocrine: No heat/cold intolerance.  Allergic/Immunologic: No urticaria.  Resp: No cough, SOB.  Musculoskeletal: No joint swelling.  Physical Examination: BP 111/74 (BP Location: Right Arm)   Pulse 67   Temp 99.2 F (37.3 C) (Oral)   Resp 16   Ht 5' 3" (1.6 m)   Wt 88.5 kg   SpO2 100%   BMI 34.54 kg/m  Gen: NAD, alert and oriented x 4 HEENT: PEERLA, EOMI, Neck: supple, no JVD or thyromegaly Chest: CTA bilaterally, no wheezes, crackles, or other adventitious sounds CV: RRR, no m/g/c/r Abd: soft, NT, ND, +BS in all four quadrants; no HSM, guarding, ridigity, or rebound tenderness Ext: no edema, well perfused with 2+ pulses, Skin: no rash or lesions noted Lymph: no LAD  Data: Lab Results  Component  Value Date   WBC 6.8 01/24/2019   HGB 12.8 01/24/2019   HCT 42.1 01/24/2019   MCV 74.8 (L) 01/24/2019   PLT 341 01/24/2019   Recent Labs  Lab 01/23/19 2021 01/24/19 0527  HGB 13.1 12.8   Lab Results  Component Value Date   NA 141 01/24/2019   K 3.4 (L) 01/24/2019   CL 104 01/24/2019   CO2 25 01/24/2019   BUN 8 01/24/2019   CREATININE 0.56 01/24/2019   Lab Results  Component Value Date   ALT 439 (H) 01/24/2019   AST 335 (H) 01/24/2019   ALKPHOS 126 01/24/2019   BILITOT 2.0 (H) 01/24/2019   No results for input(s): APTT, INR, PTT in the last 168 hours. 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 Acute hepatitis 2. Intractable nausea and vomiting - resolving, 2/2 #1 3. Diffuse abdominal pain - resolved 4. Cholelithiasis wo evidence for cholecystitis 5. Enterocolitis - seen on CT abd/pelvis  -Acute hepatitis is present given her findings for elevated aminotransferases (AST 612, ALT 501, tbili 2.8). Viral hepatitis panel was negative. -LFTs trending down - this morning AST 335, ALT 439, tbili 2.0 -Patient is clinically improving with resolution of her abdominal pain and nausea/vomiting -Continue symptomatic care with anti-emetics -No clinical signs of diarrhea to suggest an infectious or inflammatory colitis  -We will check ANA, ASMA, AMA, LKM Abs, immunoglobulin panel, CMV, EBV, HSV antibodies to complete work-up -She should have her hepatitis panel re-checked in 1 month for completness -Patient should follow-up closely as an outpatient with GI -Avoid hepatotoxins. Continue to avoid alcohol, tobacco products -Patient can be discharged today since she can tolerate oral intake without difficulty and is showing symptomatic improvement. She will follow-up with me in the office.    Thank you  for the consult. Please call with questions or concerns.  [facility] [phone] (Cell)

## 2019-01-24 NOTE — ED Notes (Signed)
ED Provider Brown at bedside. 

## 2019-01-24 NOTE — Progress Notes (Signed)
Sharion Settler notified of patient's K 3.4 this am.

## 2019-01-24 NOTE — Discharge Summary (Signed)
[facility] [place] at [facility]   PATIENT NAME: Debbie Mercer    MR#:  [id number]  DATE OF BIRTH:  1995-08-20  DATE OF ADMISSION:  01/24/2019 ADMITTING PHYSICIAN: Hannah BeatJan A Mansy, MD  DATE OF DISCHARGE: 01/24/2019  PRIMARY CARE PHYSICIAN: Patient, No Pcp Per    ADMISSION DIAGNOSIS:  Acute hepatitis [B17.9] RUQ pain [R10.11] Calculus of gallbladder without cholecystitis without obstruction [K80.20]  DISCHARGE DIAGNOSIS:  Nausea vomiting Elevated liver function test  SECONDARY DIAGNOSIS:   Past Medical History:  Diagnosis Date  . Medical history non-contributory     HOSPITAL COURSE:   1.  Nausea vomiting with elevated liver function test.  Patient seen this morning and wanted to see how lunch goes whether she can tolerate it.  Lunch went okay and she was not having any further nausea vomiting.  When I saw her earlier today she was not having any further abdominal pain.  Her liver function test have been trending better.  Patient states that she has not been on any new medications and does not take any medications at home.  Patient wants to follow-up as outpatient.  AST went from 612 down to 335.  ALT went to 501 down to 439.  Total bilirubin 2.8 down to 2.0. Alkaline phosphatase in the normal range.  Imaging test shows that the gallbladder does have some gallstones but no signs of acute cholecystitis.  Patient not having any right upper quadrant tenderness.  The patient states that she did eat some chicken that she felt was undercooked at a restaurant and felt sick after that.  None of her family members did have that chicken.  No other family members are sick.  Her COVID-19 test was negative.  Acute hepatitis profile negative.  She will need close clinical follow-up as outpatient.  She will try to set up an appointment with [facility] in [place].  If liver function does not improve as outpatient can consider an MRCP or HIDA scan. Seen by GI here and will  have a follow-up appointment with them also.  Patient states that she also occasionally smokes marijuana.  I mentioned that there is a syndrome called cannabis hyperemesis syndrome that people can give her repeated nausea vomiting.  This seems less likely the cause.  DISCHARGE CONDITIONS:   Satisfactory  CONSULTS OBTAINED:  Treatment Team:  Stanton Kidneyoledo, Teodoro K, MD  DRUG ALLERGIES:   Allergies  Allergen Reactions  . Shellfish Allergy Anaphylaxis and Itching    DISCHARGE MEDICATIONS:   Allergies as of 01/24/2019      Reactions   Shellfish Allergy Anaphylaxis, Itching      Medication List    STOP taking these medications   acyclovir 400 MG tablet Commonly known as: ZOVIRAX   CitraNatal Bloom 90-1 MG Tabs   cyclobenzaprine 5 MG tablet Commonly known as: FLEXERIL   famotidine 20 MG tablet Commonly known as: PEPCID   fluticasone 50 MCG/ACT nasal spray Commonly known as: FLONASE   HYDROcodone-Acetaminophen 10-325 MG/15ML Soln   ibuprofen 600 MG tablet Commonly known as: ADVIL   levocetirizine 5 MG tablet Commonly known as: XYZAL   montelukast 10 MG tablet Commonly known as: SINGULAIR   SUMAtriptan 25 MG tablet Commonly known as: Imitrex     TAKE these medications   EPINEPHrine 0.3 mg/0.3 mL Soaj injection Commonly known as: Auvi-Q Use as directed for severe allergic reaction.   ondansetron 4 MG tablet Commonly known as: ZOFRAN Take 1 tablet (4 mg total) by mouth every  6 (six) hours as needed for nausea.        DISCHARGE INSTRUCTIONS:   Follow-up with new PMD soon as possible Follow-up with GI 1 week  If you experience worsening of your admission symptoms, develop shortness of breath, life threatening emergency, suicidal or homicidal thoughts you must seek medical attention immediately by calling 911 or calling your MD immediately  if symptoms less severe.  You Must read complete instructions/literature along with all the possible adverse  reactions/side effects for all the Medicines you take and that have been prescribed to you. Take any new Medicines after you have completely understood and accept all the possible adverse reactions/side effects.   Please note  You were cared for by a hospitalist during your hospital stay. If you have any questions about your discharge medications or the care you received while you were in the hospital after you are discharged, you can call the unit and asked to speak with the hospitalist on call if the hospitalist that took care of you is not available. Once you are discharged, your primary care physician will handle any further medical issues. Please note that NO REFILLS for any discharge medications will be authorized once you are discharged, as it is imperative that you return to your primary care physician (or establish a relationship with a primary care physician if you do not have one) for your aftercare needs so that they can reassess your need for medications and monitor your lab values.    Today   CHIEF COMPLAINT:   Chief Complaint  Patient presents with  . Abdominal Pain    HISTORY OF PRESENT ILLNESS:  Debbie Mercer  is a 23 y.o. female coming in with abdominal pain nausea vomiting   VITAL SIGNS:  Blood pressure 111/74, pulse 67, temperature 99.2 F (37.3 C), temperature source Oral, resp. rate 16, height  (1.6 m), weight 88.5 kg, SpO2 100 %, currently breastfeeding.  I/O:  No intake or output data in the 24 hours ending 01/24/19 1512  PHYSICAL EXAMINATION:  GENERAL:  23 y.o.-year-old patient lying in the bed with no acute distress.  EYES: Pupils equal, round, reactive to light and accommodation. No scleral icterus. Extraocular muscles intact.  HEENT: Head atraumatic, normocephalic. Oropharynx and nasopharynx clear.  NECK:  Supple, no jugular venous distention. No thyroid enlargement, no tenderness.  LUNGS: Normal breath sounds bilaterally, no wheezing, rales,rhonchi  or crepitation. No use of accessory muscles of respiration.  CARDIOVASCULAR: S1, S2 normal. No murmurs, rubs, or gallops.  ABDOMEN: Soft, non-tender, non-distended. Bowel sounds present. No organomegaly or mass.  EXTREMITIES: No pedal edema, cyanosis, or clubbing.  NEUROLOGIC: Cranial nerves II through XII are intact. Muscle strength 5/5 in all extremities. Sensation intact. Gait not checked.  PSYCHIATRIC: The patient is alert and oriented x 3.  SKIN: No obvious rash, lesion, or ulcer.   DATA REVIEW:   CBC Recent Labs  Lab 01/24/19 0527  WBC 6.8  HGB 12.8  HCT 42.1  PLT 341    Chemistries  Recent Labs  Lab 01/24/19 0527  NA 141  K 3.4*  CL 104  CO2 25  GLUCOSE 96  BUN 8  CREATININE 0.56  CALCIUM 9.1  AST 335*  ALT 439*  ALKPHOS 126  BILITOT 2.0*    Microbiology Results  Results for orders placed or performed during the hospital encounter of 01/24/19  SARS CORONAVIRUS 2 (TAT 6-24 HRS) Nasopharyngeal Nasopharyngeal Swab     Status: None   Collection Time: 01/24/19  6:27 AM   Specimen: Nasopharyngeal Swab  Result Value Ref Range Status   SARS Coronavirus 2 NEGATIVE NEGATIVE Final    Comment: (NOTE) SARS-CoV-2 target nucleic acids are NOT DETECTED. The SARS-CoV-2 RNA is generally detectable in upper and lower respiratory specimens during the acute phase of infection. Negative results do not preclude SARS-CoV-2 infection, do not rule out co-infections with other pathogens, and should not be used as the sole basis for treatment or other patient management decisions. Negative results must be combined with clinical observations, patient history, and epidemiological information. The expected result is Negative. Fact Sheet for Patients: SugarRoll.be Fact Sheet for Healthcare Providers: https://www.woods-mathews.com/ This test is not yet approved or cleared by the [place] FDA and  has been authorized for detection and/or  diagnosis of SARS-CoV-2 by FDA under an Emergency Use Authorization (EUA). This EUA will remain  in effect (meaning this test can be used) for the duration of the COVID-19 declaration under Section 56 4(b)(1) of the Act, 21 U.S.C. section 360bbb-3(b)(1), unless the authorization is terminated or revoked sooner. Performed at [facility], [place] [address], [place], [place] [address]     RADIOLOGY:  CT ABDOMEN PELVIS W CONTRAST  Result Date: 01/24/2019 CLINICAL DATA:  Right upper quadrant pain EXAM: CT ABDOMEN AND PELVIS WITH CONTRAST TECHNIQUE: Multidetector CT imaging of the abdomen and pelvis was performed using the standard protocol following bolus administration of intravenous contrast. CONTRAST:  141mL OMNIPAQUE IOHEXOL 300 MG/ML  SOLN COMPARISON:  Ultrasound 01/23/2019 FINDINGS: Lower chest: No acute abnormality. Hepatobiliary: Small gallstones. No focal hepatic abnormality. Negative for biliary dilatation Pancreas: Unremarkable. No pancreatic ductal dilatation or surrounding inflammatory changes. Spleen: Normal in size without focal abnormality. Adrenals/Urinary Tract: Adrenal glands are unremarkable. Kidneys are normal, without renal calculi, focal lesion, or hydronephrosis. Bladder is unremarkable. Stomach/Bowel: The stomach is nonenlarged. No dilated small bowel. Negative appendix. Slightly thickened appearance of the terminal ileum and ileocecal junction. Suspected mild wall thickening of the cecum ascending colon and hepatic flexure. Possible mild wall thickening at the transverse colon. Mild diffuse diverticular disease of the colon. Vascular/Lymphatic: No significant vascular findings are present. No enlarged abdominal or pelvic lymph nodes. Reproductive: Uterus and bilateral adnexa are unremarkable. Other: Negative for free air or free fluid Musculoskeletal: No acute or significant osseous findings. IMPRESSION: 1. Negative for acute appendicitis. 2. Small gallstones without right  upper quadrant inflammatory change to suggest acute gallbladder inflammation 3. Suspicion of mild terminal ileal thickening and mild wall thickening of the cecum, ascending colon, hepatic flexure and portions of the transverse colon as may be seen with enterocolitis. This could be secondary to infection or inflammatory bowel disease. Electronically Signed   By: Donavan Foil M.D.   On: 01/24/2019 01:46   US Abdomen Limited RUQ  Result Date: 01/23/2019 CLINICAL DATA:  Initial evaluation for acute right upper quadrant pain with nausea, vomiting. EXAM: ULTRASOUND ABDOMEN LIMITED RIGHT UPPER QUADRANT COMPARISON:  None. FINDINGS: Gallbladder: Multiple echogenic stones seen within the gallbladder lumen, largest of which measures 7 mm. Gallbladder wall measure within normal limits at 2.6 mm. No free pericholecystic fluid. No sonographic Murphy sign elicited on exam. Common bile duct: Diameter: 4.4 mm Liver: No focal lesion identified. Within normal limits in parenchymal echogenicity. Portal vein is patent on color Doppler imaging with normal direction of blood flow towards the liver. Other: None. IMPRESSION: 1. Cholelithiasis. No sonographic features to suggest acute cholecystitis. 2. No biliary dilatation. Electronically Signed   By: Pincus Badder.D.  On: 01/23/2019 23:18     Management plans discussed with the patient, and she is in agreement.  CODE STATUS:     Code Status Orders  (From admission, onward)         Start     Ordered   01/24/19 0347  Full code  Continuous     01/24/19 0348        Code Status History    Date Active Date Inactive Code Status Order ID Comments User Context   02/23/2017 1426 02/25/2017 1850 Full Code [id number]  Raelyn Mora, CNM Inpatient   02/21/2017 1152 02/23/2017 1425 Full Code [id number]  Oralia Manis, DO Inpatient   08/19/2016 1404 08/20/2016 1517 Full Code [id number]  Melene Plan, DO ED   03/18/2013 1215 03/21/2013 1516 Full Code [id number]  Purcell Nails, MD Inpatient   03/14/2013 1223 03/16/2013 1432 Full Code [id number]  Jaymes Graff, MD Inpatient   03/13/2013 2329 03/14/2013 1223 Full Code [id number]  Heidi Dach, RN Inpatient   Advance Care Planning Activity      TOTAL TIME TAKING CARE OF THIS PATIENT: 35 minutes.    Alford Highland M.D on 01/24/2019 at 3:12 PM  Between 7am to 6pm - Pager - [phone]  After 6pm go to [email or web address] - password [email or web address]  [facility]  CC: Primary care physician; Patient, No Pcp Per

## 2019-01-24 NOTE — ED Notes (Signed)
Patient transported to CT 

## 2019-01-24 NOTE — ED Provider Notes (Signed)
[facility] Emergency Department Provider Note  ____________________________________________   First MD Initiated Contact with Patient 01/24/19 0023     (approximate)  I have reviewed the triage vital signs and the nursing notes.   HISTORY  Chief Complaint Abdominal Pain   HPI Debbie Mercer is a 23 y.o. female with below list of previous medical conditions presents to the emergency department secondary to acute onset of vomiting yesterday morning at 3 AM following eating [employer] the night before.  Patient states that when she ate the chicken at [employer] it tasted as though it was not cooked all the way and as such she did not consume it.  Patient admits to abdominal discomfort.  Patient denies any fever.  Patient denies any diarrhea.        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 Authorizing Provider  cyclobenzaprine (FLEXERIL) 5 MG tablet Take 1 tablet (5 mg total) by mouth 2 (two) times daily as needed for muscle spasms. 10/15/18  Yes Hedges, Tinnie GensJeffrey, PA-C  EPINEPHrine (AUVI-Q) 0.3 mg/0.3 mL IJ SOAJ injection Use as directed for severe allergic reaction. 11/22/17  Yes  Bobbitt, Heywood Ilesalph Carter, MD  ibuprofen (ADVIL) 600 MG tablet Take 1 tablet (600 mg total) by mouth every 6 (six) hours as needed. 10/11/18  Yes Lamptey, Britta MccreedyPhilip O, MD  acyclovir (ZOVIRAX) 400 MG tablet Take 1 tablet (400 mg total) by mouth 2 (two) times daily. 12/09/17 01/08/18  Alene Miresmohundro, Jennifer C, NP  famotidine (PEPCID) 20 MG tablet Take 1 tablet (20 mg total) by mouth 2 (two) times daily as needed. Patient not taking: Reported on 01/24/2019 11/22/17   Bobbitt, Heywood Ilesalph Carter, MD  fluticasone [facility](FLONASE) 50 MCG/ACT nasal spray One spray each nostril one to two times a day as needed for nasal congestion or drainage. Patient not taking: Reported on 01/24/2019 11/22/17   Bobbitt, Heywood Ilesalph Carter, MD  HYDROcodone-Acetaminophen 10-325 MG/15ML SOLN Take 1 tablet by mouth every 8 (eight) hours as needed (pain).    [provider]  levocetirizine (XYZAL) 5 MG tablet Take 1 tablet (5 mg total) by mouth every evening. Patient not taking: Reported on 01/24/2019 11/22/17   Bobbitt, Heywood Ilesalph Carter, MD  montelukast (SINGULAIR) 10 MG tablet Take 1 tablet (10 mg total) by mouth at bedtime. Patient not taking: Reported on 01/24/2019 11/22/17   Bobbitt, Heywood Ilesalph Carter, MD  Prenatal-DSS-FeCb-FeGl-FA (CITRANATAL BLOOM) 90-1 MG TABS Take 1 tablet daily by mouth. Patient not taking: Reported on 01/24/2019 12/08/16   Orvilla Cornwallenney, Rachelle A, CNM  SUMAtriptan (IMITREX) 25 MG tablet Please take 1 tablet at the outset of the headaches. May repeat in 2 hours if headache persists or recurs. Patient not taking: Reported on 01/24/2019 10/11/18   Demaris CallanderLamptey, Philip  O, MD    Allergies Shellfish allergy  Family History  Problem Relation Age of Onset   Hypertension Mother    Allergic rhinitis Mother    Diabetes Maternal Grandmother    Anemia Maternal Grandmother    Food Allergy Sister        shellfish allergy   Angioedema Neg Hx    Asthma Neg Hx    Eczema Neg Hx    Immunodeficiency Neg Hx    Urticaria Neg Hx      Social History Social History   Tobacco Use   Smoking status: Never Smoker   Smokeless tobacco: Never Used  Substance Use Topics   Alcohol use: No   Drug use: Not Currently    Types: Marijuana    Comment: occasional but not recently    Review of Systems Constitutional: No fever/chills Eyes: No visual changes. ENT: No sore throat. Cardiovascular: Denies chest pain. Respiratory: Denies shortness of breath. Gastrointestinal: Positive for abdominal pain nausea and vomiting. Genitourinary: Negative for dysuria. Musculoskeletal: Negative for neck pain.  Negative for back pain. Integumentary: Negative for rash. Neurological: Negative for headaches, focal weakness or numbness.   ____________________________________________   PHYSICAL EXAM:  VITAL SIGNS: ED Triage Vitals  Enc Vitals Group     BP 01/23/19 2011 116/66     Pulse Rate 01/23/19 2011 89     Resp 01/23/19 2011 18     Temp 01/23/19 2011 98.5 F (36.9 C)     Temp Source 01/23/19 2011 Oral     SpO2 01/23/19 2011 100 %     Weight 01/23/19 2012 88.5 kg (195 lb)     Height 01/23/19 2012 1.6 m ( )     Head Circumference --      Peak Flow --      Pain Score 01/23/19 2011 4     Pain Loc --      Pain Edu? --      Excl. in GC? --     Constitutional: Alert and oriented.  Eyes: Conjunctivae are normal.  Head: Atraumatic. Mouth/Throat: Patient is wearing a mask. Neck: No stridor.  No meningeal signs.   Cardiovascular: Normal rate, regular rhythm. Good peripheral circulation. Grossly normal heart sounds. Respiratory: Normal respiratory effort.  No retractions. Gastrointestinal: Right upper quadrant/right lower quadrant tenderness to palpation.. No distention.  Musculoskeletal: No lower extremity tenderness nor edema. No gross deformities of extremities. Neurologic:  Normal speech and language. No gross focal neurologic deficits are appreciated.  Skin:  Skin is warm, dry and intact. Psychiatric: Mood and  affect are normal. Speech and behavior are normal.  ____________________________________________   LABS (all labs ordered are listed, but only abnormal results are displayed)  Labs Reviewed  COMPREHENSIVE METABOLIC PANEL - Abnormal; Notable for the following components:      Result Value   Glucose, Bld 114 (*)    AST 612 (*)    ALT 501 (*)    Total Bilirubin 2.8 (*)    All other components within normal limits  CBC - Abnormal; Notable for the following components:   RBC 5.73 (*)    MCV 75.4 (*)    MCH 22.9 (*)    All other components within normal limits  URINALYSIS, COMPLETE (UACMP) WITH MICROSCOPIC - Abnormal; Notable for the following components:   Color, Urine AMBER (*)    APPearance HAZY (*)    Bilirubin Urine SMALL (*)    Ketones, ur 20 (*)    Protein, ur 30 (*)  Bacteria, UA RARE (*)    All other components within normal limits  HEPATIC FUNCTION PANEL - Abnormal; Notable for the following components:   AST 493 (*)    ALT 510 (*)    Alkaline Phosphatase 128 (*)    Total Bilirubin 2.6 (*)    Bilirubin, Direct 1.0 (*)    Indirect Bilirubin 1.6 (*)    All other components within normal limits  SARS CORONAVIRUS 2 (TAT 6-24 HRS)  LIPASE, BLOOD  HEPATITIS PANEL, ACUTE  HIV ANTIBODY (ROUTINE TESTING W REFLEX)  COMPREHENSIVE METABOLIC PANEL  CBC  POC URINE PREG, ED  POCT PREGNANCY, URINE   ______________  RADIOLOGY I, Gem N Georgios Kina, personally viewed and evaluated these images (plain radiographs) as part of my medical decision making, as well as reviewing the written report by the radiologist.  ED MD interpretation: Cholelithiasis without evidence of cholecystitis noted on ultrasound.  CT abdomen revealed gallstones without right upper quadrant inflammatory changes.  Terminal ileal thickening as well.  Official radiology report(s): CT ABDOMEN PELVIS W CONTRAST  Result Date: 01/24/2019 CLINICAL DATA:  Right upper quadrant pain EXAM: CT ABDOMEN AND PELVIS  WITH CONTRAST TECHNIQUE: Multidetector CT imaging of the abdomen and pelvis was performed using the standard protocol following bolus administration of intravenous contrast. CONTRAST:  OMNIPAQUE IOHEXOL 300 MG/ML  SOLN COMPARISON:  Ultrasound 01/23/2019 FINDINGS: Lower chest: No acute abnormality. Hepatobiliary: Small gallstones. No focal hepatic abnormality. Negative for biliary dilatation Pancreas: Unremarkable. No pancreatic ductal dilatation or surrounding inflammatory changes. Spleen: Normal in size without focal abnormality. Adrenals/Urinary Tract: Adrenal glands are unremarkable. Kidneys are normal, without renal calculi, focal lesion, or hydronephrosis. Bladder is unremarkable. Stomach/Bowel: The stomach is nonenlarged. No dilated small bowel. Negative appendix. Slightly thickened appearance of the terminal ileum and ileocecal junction. Suspected mild wall thickening of the cecum ascending colon and hepatic flexure. Possible mild wall thickening at the transverse colon. Mild diffuse diverticular disease of the colon. Vascular/Lymphatic: No significant vascular findings are present. No enlarged abdominal or pelvic lymph nodes. Reproductive: Uterus and bilateral adnexa are unremarkable. Other: Negative for free air or free fluid Musculoskeletal: No acute or significant osseous findings. IMPRESSION: 1. Negative for acute appendicitis. 2. Small gallstones without right upper quadrant inflammatory change to suggest acute gallbladder inflammation 3. Suspicion of mild terminal ileal thickening and mild wall thickening of the cecum, ascending colon, hepatic flexure and portions of the transverse colon as may be seen with enterocolitis. This could be secondary to infection or inflammatory bowel disease. Electronically Signed   By: Jasmine Pang M.D.   On: 01/24/2019 01:46   US Abdomen Limited RUQ  Result Date: 01/23/2019 CLINICAL DATA:  Initial evaluation for acute right upper quadrant pain with nausea,  vomiting. EXAM: ULTRASOUND ABDOMEN LIMITED RIGHT UPPER QUADRANT COMPARISON:  None. FINDINGS: Gallbladder: Multiple echogenic stones seen within the gallbladder lumen, largest of which measures 7 mm. Gallbladder wall measure within normal limits at 2.6 mm. No free pericholecystic fluid. No sonographic Murphy sign elicited on exam. Common bile duct: Diameter: 4.4 mm Liver: No focal lesion identified. Within normal limits in parenchymal echogenicity. Portal vein is patent on color Doppler imaging with normal direction of blood flow towards the liver. Other: None. IMPRESSION: 1. Cholelithiasis. No sonographic features to suggest acute cholecystitis. 2. No biliary dilatation. Electronically Signed   By: Rise Mu M.D.   On: 01/23/2019 23:18     Procedures   ____________________________________________   INITIAL IMPRESSION / MDM / ASSESSMENT AND PLAN / ED COURSE  As part of my medical decision making, I reviewed the following data within the electronic MEDICAL RECORD NUMBER   23 year old female presented with above-stated history and physical exam with differential diagnosis including but not limited to infectious gastroenteritis, cholelithiasis, appendicitis.  Laboratory data revealed markedly elevated liver enzymes.  Ultrasound revealed cholelithiasis without any evidence of choledocholithiasis.  Likewise CT scan revealed the same.  Concern for possible hepatitis A given markedly elevated liver enzymes and stated history.  As such hepatitis panel obtained.  Patient received 2 L IV normal saline and Zofran 4 mg IV.  Patient discussed with Dr. Danella Penton for hospital admission further evaluation and management.      ____________________________________________  FINAL CLINICAL IMPRESSION(S) / ED DIAGNOSES  Final diagnoses:  Acute hepatitis  Calculus of gallbladder without cholecystitis without obstruction     MEDICATIONS GIVEN DURING THIS VISIT:  Medications  cyclobenzaprine (FLEXERIL)  tablet 5 mg (has no administration in time range)  enoxaparin (LOVENOX) injection 40 mg (40 mg Subcutaneous Not Given 01/24/19 0415)  0.9 %  sodium chloride infusion ( Intravenous New Bag/Given 01/24/19 0415)  acetaminophen (TYLENOL) tablet 650 mg (has no administration in time range)    Or  acetaminophen (TYLENOL) suppository 650 mg (has no administration in time range)  traZODone (DESYREL) tablet 25 mg (has no administration in time range)  magnesium hydroxide (MILK OF MAGNESIA) suspension 30 mL (has no administration in time range)  ondansetron (ZOFRAN) tablet 4 mg (has no administration in time range)    Or  ondansetron (ZOFRAN) injection 4 mg (has no administration in time range)  ondansetron (ZOFRAN-ODT) disintegrating tablet 4 mg (4 mg Oral Given 01/23/19 2017)  iohexol (OMNIPAQUE) 300 MG/ML solution 100 mL (100 mLs Intravenous Contrast Given 01/24/19 0053)     ED Discharge Orders    None      *Please note:  Iram R Bacot was evaluated in Emergency Department on 01/24/2019 for the symptoms described in the history of present illness. She was evaluated in the context of the global COVID-19 pandemic, which necessitated consideration that the patient might be at risk for infection with the SARS-CoV-2 virus that causes COVID-19. Institutional protocols and algorithms that pertain to the evaluation of patients at risk for COVID-19 are in a state of rapid change based on information released by regulatory bodies including the CDC and federal and state organizations. These policies and algorithms were followed during the patient's care in the ED.  Some ED evaluations and interventions may be delayed as a result of limited staffing during the pandemic.*  Note:  This document was prepared using Dragon voice recognition software and may include unintentional dictation errors.   Gregor Hams, MD 01/24/19 [phone]

## 2019-01-24 NOTE — Progress Notes (Signed)
Patient discharged home per MD order. All discharge instructions given and all questions answered. 

## 2019-01-26 LAB — ANA: Anti Nuclear Antibody (ANA): NEGATIVE

## 2019-01-27 LAB — EPSTEIN-BARR VIRUS (EBV) ANTIBODY PROFILE
EBV NA IgG: 355 U/mL — ABNORMAL HIGH (ref 0.0–17.9)
EBV VCA IgG: 199 U/mL — ABNORMAL HIGH (ref 0.0–17.9)
EBV VCA IgM: <36 U/mL (ref 0.0–35.9)

## 2019-01-27 LAB — ANTI-MICROSOMAL ANTIBODY LIVER / KIDNEY: LKM1 Ab: 1.3 U (ref 0.0–20.0)

## 2019-01-27 LAB — CMV ANTIBODY, IGG (EIA): CMV Ab - IgG: <0.6 U/mL (ref 0.00–0.59)

## 2019-01-28 LAB — MITOCHONDRIAL ANTIBODIES: Mitochondrial M2 Ab, IgG: <20 Units (ref 0.0–20.0)

## 2019-01-28 LAB — IMMUNOGLOBULINS A/E/G/M, SERUM
IgA: 68 mg/dL — ABNORMAL LOW (ref 87–352)
IgE (Immunoglobulin E), Serum: 257 IU/mL (ref 6–495)
IgG (Immunoglobin G), Serum: 1157 mg/dL (ref 586–1602)
IgM (Immunoglobulin M), Srm: 102 mg/dL (ref 26–217)

## 2019-01-28 LAB — ANTI-SMOOTH MUSCLE ANTIBODY, IGG: F-Actin IgG: 15 Units (ref 0–19)

## 2019-01-29 LAB — HSV 1 AND 2 IGM ABS, INDIRECT
HSV 1 IgM Antibodies: <1:10 {titer}
HSV 2 IgM Antibodies: <1:10 {titer}

## 2019-04-06 ENCOUNTER — Encounter (HOSPITAL_COMMUNITY): Payer: Self-pay | Admitting: *Deleted

## 2019-04-06 ENCOUNTER — Ambulatory Visit (HOSPITAL_COMMUNITY)
Admission: EM | Admit: 2019-04-06 | Discharge: 2019-04-06 | Disposition: A | Discharge status: Home / Self Care | Payer: PRIVATE HEALTH INSURANCE | Attending: Urgent Care | Admitting: Urgent Care

## 2019-04-06 ENCOUNTER — Other Ambulatory Visit: Payer: Self-pay

## 2019-04-06 DIAGNOSIS — R21 Rash and other nonspecific skin eruption: Secondary | ICD-10-CM | POA: Insufficient documentation

## 2019-04-06 DIAGNOSIS — Z7251 High risk heterosexual behavior: Secondary | ICD-10-CM

## 2019-04-06 DIAGNOSIS — Z113 Encounter for screening for infections with a predominantly sexual mode of transmission: Secondary | ICD-10-CM | POA: Insufficient documentation

## 2019-04-06 MED ORDER — HYDROXYZINE HCL 25 MG PO TABS: 12.5000 mg | ORAL_TABLET | Freq: Three times a day (TID) | ORAL | 0 refills | Status: DC | PRN

## 2019-04-06 MED ORDER — KETOCONAZOLE 2 % EX CREA: 1.0000 "application " | TOPICAL_CREAM | Freq: Every day | CUTANEOUS | 0 refills | Status: DC

## 2019-04-06 NOTE — Discharge Instructions (Signed)
Start ketoconazole cream once daily for the next couple of weeks.  This is an antifungal cream to try and treat the rash on your right torso.  If the symptoms worsen or persist come back for a recheck.  I am also providing you with hydroxyzine, medication that can help with itching.

## 2019-04-06 NOTE — ED Provider Notes (Addendum)
[place]   MRN: [id number] DOB: 1995/04/30  Subjective:   Debbie Mercer is a 24 y.o. female presenting for screening for STIs.  Patient has unprotected sex.  She is not on any contraception.  Does not want to have pregnancy testing.  She also complains of a 2-week history of a rash on her torso that is itchy.  She has a smaller similar rash on the underside of her breast where this area makes contact.  Has tried cortisone cream but has only progressed the rash.  No current facility-administered medications for this encounter.  Current Outpatient Medications:  .  IBUPROFEN PO, Take by mouth., Disp: , Rfl:  .  EPINEPHrine (AUVI-Q) 0.3 mg/0.3 mL IJ SOAJ injection, Use as directed for severe allergic reaction., Disp: 4 Device, Rfl: 1 .  ondansetron (ZOFRAN) 4 MG tablet, Take 1 tablet (4 mg total) by mouth every 6 (six) hours as needed for nausea., Disp: 20 tablet, Rfl: 0   Allergies  Allergen Reactions  . Shellfish Allergy Anaphylaxis and Itching    History reviewed. No pertinent past medical history.   Past Surgical History:  Procedure Laterality Date  . BREAST LUMPECTOMY    . TOOTH EXTRACTION      Family History  Problem Relation Age of Onset  . Hypertension Mother   . Allergic rhinitis Mother   . Diabetes Maternal Grandmother   . Anemia Maternal Grandmother   . Food Allergy Sister        shellfish allergy  . Angioedema Neg Hx   . Asthma Neg Hx   . Eczema Neg Hx   . Immunodeficiency Neg Hx   . Urticaria Neg Hx     Social History   Tobacco Use  . Smoking status: Never Smoker  . Smokeless tobacco: Never Used  Substance Use Topics  . Alcohol use: No  . Drug use: Yes    Types: Marijuana    Comment: very occasionally    Review of Systems  Constitutional: Negative for chills and fever.  Respiratory: Negative for shortness of breath.   Cardiovascular: Negative for chest pain.  Gastrointestinal: Negative for abdominal pain, diarrhea, nausea and  vomiting.  Genitourinary: Negative for dysuria, flank pain, frequency, hematuria and urgency.  Musculoskeletal: Negative for myalgias.  Skin: Negative for rash.  Neurological: Negative for dizziness and headaches.     Objective:   Vitals: BP 113/67   Pulse 68   Temp 99.3 F (37.4 C) (Oral)   Resp 16   LMP 03/11/2019 (Approximate)   SpO2 100%   Breastfeeding No   Physical Exam Constitutional:      General: She is not in acute distress.    Appearance: Normal appearance. She is well-developed. She is not ill-appearing.  HENT:     Head: Normocephalic and atraumatic.     Nose: Nose normal.     Mouth/Throat:     Mouth: Mucous membranes are moist.     Pharynx: Oropharynx is clear.  Eyes:     General: No scleral icterus.    Extraocular Movements: Extraocular movements intact.     Pupils: Pupils are equal, round, and reactive to light.  Cardiovascular:     Rate and Rhythm: Normal rate.  Pulmonary:     Effort: Pulmonary effort is normal.  Skin:    General: Skin is warm and dry.       Neurological:     General: No focal deficit present.     Mental Status: She is alert and oriented to  person, place, and time.  Psychiatric:        Mood and Affect: Mood normal.        Behavior: Behavior normal.        Thought Content: Thought content normal.        Judgment: Judgment normal.      Assessment and Plan :   1. Unprotected sex   2. Screen for STD (sexually transmitted disease)   3. Rash and nonspecific skin eruption     Patient refused pregnancy test.  Patient has not had her cycle this month but is due for it soon, is not concerned for pregnancy at this time. STI testing pending. Start ketoconazole cream to address rash as fungal in etiology, use hydroxyzine for supportive care of itching. Counseled patient on potential for adverse effects with medications prescribed/recommended today, ER and return-to-clinic precautions discussed, patient verbalized understanding.      Wallis Bamberg, PA-C 04/06/19 1218

## 2019-04-06 NOTE — ED Triage Notes (Signed)
Pt denies any sxs.  States just wishes to be tested for STDs.

## 2019-04-10 LAB — CERVICOVAGINAL ANCILLARY ONLY
Bacterial vaginitis: NEGATIVE
Candida vaginitis: NEGATIVE
Chlamydia: NEGATIVE
Neisseria Gonorrhea: NEGATIVE
Trichomonas: NEGATIVE

## 2019-04-28 ENCOUNTER — Other Ambulatory Visit: Payer: Self-pay

## 2019-04-28 ENCOUNTER — Encounter (HOSPITAL_COMMUNITY): Payer: Self-pay

## 2019-04-28 ENCOUNTER — Ambulatory Visit (HOSPITAL_COMMUNITY)
Admission: EM | Admit: 2019-04-28 | Discharge: 2019-04-28 | Disposition: A | Discharge status: Home / Self Care | Payer: Medicaid Other | Attending: Family Medicine | Admitting: Family Medicine

## 2019-04-28 DIAGNOSIS — N898 Other specified noninflammatory disorders of vagina: Secondary | ICD-10-CM | POA: Diagnosis not present

## 2019-04-28 DIAGNOSIS — Z7689 Persons encountering health services in other specified circumstances: Secondary | ICD-10-CM | POA: Insufficient documentation

## 2019-04-28 DIAGNOSIS — Z202 Contact with and (suspected) exposure to infections with a predominantly sexual mode of transmission: Secondary | ICD-10-CM

## 2019-04-28 MED ORDER — FLUCONAZOLE 150 MG PO TABS: ORAL_TABLET | ORAL | 0 refills | Status: DC

## 2019-04-28 NOTE — ED Provider Notes (Signed)
[facility]    CSN: [id number] Arrival date & time: 04/28/19  1144      History   Chief Complaint Chief Complaint  Patient presents with  . Vaginal Discharge    HPI Debbie Mercer is a 24 y.o. female.   Patient presents with vaginal itching and burning x2 days.  Reports that she woke up with clear and white vaginal discharge today.  Reports that she has had history of yeast infections.  Denies any treatment for this at home.  Denies any other vaginal discharge, or odor.  Denies abnormal vaginal bleeding.  Denies headache, sore throat, shortness of breath, nausea, vomiting, diarrhea, rash, abdominal pain, fever, other symptoms.  ROS per HPI  The history is provided by the patient.    History reviewed. No pertinent past medical history.  Patient Active Problem List   Diagnosis Date Noted  . Acute hepatitis 01/24/2019  . Nausea and vomiting   . Elevated liver function tests   . Recurrent urticaria 11/22/2017  . Angioedema 11/22/2017  . Allergic reaction 11/22/2017  . Seasonal allergic rhinitis 11/22/2017  . Mild tetrahydrocannabinol (THC) abuse 08/25/2016  . 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 Vitamin D deficiency 08/02/2016  . History of acute pyelonephritis 07/28/2016  . Allergy to shellfish 03/05/2013  . 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 GERD (gastroesophageal reflux disease) 03/05/2013    Past Surgical History:  Procedure Laterality Date  . BREAST LUMPECTOMY    . TOOTH EXTRACTION      OB History    Gravida  3   Para  2   Term  2   Preterm  0   AB  1   Living  2     SAB  1   TAB  0   Ectopic  0   Multiple  0   Live Births  2            Home Medications    Prior to Admission medications   Medication Sig Start Date End Date Taking? Authorizing Provider  EPINEPHrine (AUVI-Q) 0.3 mg/0.3 mL IJ SOAJ injection Use as directed for severe allergic reaction. 11/22/17    Bobbitt, Sedalia Muta, MD  fluconazole (DIFLUCAN) 150 MG tablet Take one tablet at the onset of symptoms, if still having symptoms in 3 days, take the second tablet. 04/28/19   Faustino Congress, NP  hydrOXYzine (ATARAX/VISTARIL) 25 MG tablet Take 0.5-1 tablets (12.5-25 mg total) by mouth every 8 (eight) hours as needed for itching. 04/06/19   Jaynee Eagles, PA-C  IBUPROFEN PO Take by mouth.    [provider]  ketoconazole (NIZORAL) 2 % cream Apply 1 application topically daily. 04/06/19   Jaynee Eagles, PA-C    Family History Family History  Problem Relation Age of Onset  . Hypertension Mother   . Allergic rhinitis Mother   . Diabetes Maternal Grandmother   . Anemia Maternal Grandmother   . Food Allergy Sister        shellfish allergy  . Angioedema Neg Hx   . Asthma Neg Hx   . Eczema Neg Hx   . Immunodeficiency Neg Hx   . Urticaria Neg Hx     Social History Social History   Tobacco Use  . Smoking status: Never Smoker  . Smokeless tobacco: Never Used  Substance Use Topics  . Alcohol use: No  . Drug use: Yes    Types: Marijuana    Comment: very occasionally  Allergies   Shellfish allergy   Review of Systems Review of Systems   Physical Exam Triage Vital Signs ED Triage Vitals  Enc Vitals Group     BP 04/28/19 1216 118/63     Pulse Rate 04/28/19 1216 83     Resp 04/28/19 1216 16     Temp 04/28/19 1216 98.9 F (37.2 C)     Temp Source 04/28/19 1216 Oral     SpO2 04/28/19 1216 100 %     Weight --      Height --      Head Circumference --      Peak Flow --      Pain Score 04/28/19 1217 0     Pain Loc --      Pain Edu? --      Excl. in GC? --    No data found.  Updated Vital Signs BP 118/63 (BP Location: Right Arm)   Pulse 83   Temp 98.9 F (37.2 C) (Oral)   Resp 16   LMP 04/07/2019   SpO2 100%   Visual Acuity Right Eye Distance:   Left Eye Distance:   Bilateral Distance:    Right Eye Near:   Left Eye Near:    Bilateral Near:      Physical Exam   UC Treatments / Results  Labs (all labs ordered are listed, but only abnormal results are displayed) Labs Reviewed  CERVICOVAGINAL ANCILLARY ONLY    EKG   Radiology No results found.  Procedures Procedures (including critical care time)  Medications Ordered in UC Medications - No data to display  Initial Impression / Assessment and Plan / UC Course  I have reviewed the triage vital signs and the nursing notes.  Pertinent labs & imaging results that were available during my care of the patient were reviewed by me and considered in my medical decision making (see chart for details).     Vaginal itching/vaginal discharge: Presents with vaginal itching and clear/white discharge for the last 2 days.  Has had vaginal yeast infection previously.  We will go ahead and treat for vaginal Candida, with Diflucan.  Instructed patient to take 1 tablet today and another tablet in 3 days if she is still experiencing symptoms.  She does not have to take the second tablet if she is not experiencing symptoms on day 3.  Encounter for assessment of STDs: Swab obtained for gonorrhea, chlamydia, trichomoniasis, BV, yeast.  Instructed patient that her results will show up on her MyChart.  Also instructed patient that if she needs further treatment for any lab results that come back positive, we will contact her and let her know about this.  Follow-up with primary care as needed. Final Clinical Impressions(s) / UC Diagnoses   Final diagnoses:  Vaginal discharge  Encounter for assessment of STD exposure  Vaginal itching     Discharge Instructions     You are likely experiencing a yeast infection. I will send in Diflucan to your pharmacy. You may take one tablet today and one tablet in 3 days if you are experiencing symptoms still.   Your STD tests are pending.  If your test results are positive, we will call you.  You may need additional treatment and your partner(s) may also  need treatment.         ED Prescriptions    Medication Sig Dispense Auth. Provider   fluconazole (DIFLUCAN) 150 MG tablet Take one tablet at the onset of symptoms, if still having  symptoms in 3 days, take the second tablet. 2 tablet Moshe Cipro, NP     PDMP not reviewed this encounter.   Moshe Cipro, NP 04/28/19 1420

## 2019-04-28 NOTE — ED Triage Notes (Signed)
Pt present vaginal discharge with some discomfort.  Symptoms 3 days ago.

## 2019-04-28 NOTE — Discharge Instructions (Addendum)
You are likely experiencing a yeast infection. I will send in Diflucan to your pharmacy. You may take one tablet today and one tablet in 3 days if you are experiencing symptoms still.   Your STD tests are pending.  If your test results are positive, we will call you.  You may need additional treatment and your partner(s) may also need treatment.

## 2019-04-29 LAB — CERVICOVAGINAL ANCILLARY ONLY
Bacterial Vaginitis (gardnerella): NEGATIVE
Candida Glabrata: NEGATIVE
Candida Vaginitis: POSITIVE — AB
Chlamydia: NEGATIVE
Comment: NEGATIVE
Comment: NEGATIVE
Comment: NEGATIVE
Comment: NEGATIVE
Comment: NEGATIVE
Comment: NORMAL
Neisseria Gonorrhea: NEGATIVE
Trichomonas: NEGATIVE

## 2019-06-16 ENCOUNTER — Emergency Department
Admission: EM | Admit: 2019-06-16 | Discharge: 2019-06-16 | Discharge status: Against Medical Advice | Payer: PRIVATE HEALTH INSURANCE

## 2019-06-16 NOTE — ED Notes (Signed)
First Nurse Note: Pt to ED via [facility] from home for dizziness. Pt had a tooth removed a few days ago. Pt took her pain medication on an empty stomach and then started feeling bad.  Pt is in NAD.

## 2019-11-30 ENCOUNTER — Emergency Department
Admission: EM | Admit: 2019-11-30 | Discharge: 2019-11-30 | Disposition: A | Discharge status: Home / Self Care | Payer: Medicaid Other | Attending: Emergency Medicine | Admitting: Emergency Medicine

## 2019-11-30 ENCOUNTER — Other Ambulatory Visit: Payer: Self-pay

## 2019-11-30 ENCOUNTER — Encounter: Payer: Self-pay | Admitting: Emergency Medicine

## 2019-11-30 DIAGNOSIS — R112 Nausea with vomiting, unspecified: Secondary | ICD-10-CM | POA: Insufficient documentation

## 2019-11-30 DIAGNOSIS — R519 Headache, unspecified: Secondary | ICD-10-CM | POA: Insufficient documentation

## 2019-11-30 DIAGNOSIS — Z5321 Procedure and treatment not carried out due to patient leaving prior to being seen by health care provider: Secondary | ICD-10-CM | POA: Insufficient documentation

## 2019-11-30 LAB — COMPREHENSIVE METABOLIC PANEL
ALT: 16 U/L (ref 0–44)
AST: 22 U/L (ref 15–41)
Albumin: 4 g/dL (ref 3.5–5.0)
Alkaline Phosphatase: 55 U/L (ref 38–126)
Anion gap: 10 (ref 5–15)
BUN: 14 mg/dL (ref 6–20)
CO2: 24 mmol/L (ref 22–32)
Calcium: 9.2 mg/dL (ref 8.9–10.3)
Chloride: 104 mmol/L (ref 98–111)
Creatinine, Ser: 0.73 mg/dL (ref 0.44–1.00)
GFR, Estimated: >60 mL/min (ref >60)
Glucose, Bld: 111 mg/dL — ABNORMAL HIGH (ref 70–99)
Potassium: 3.5 mmol/L (ref 3.5–5.1)
Sodium: 138 mmol/L (ref 135–145)
Total Bilirubin: 0.9 mg/dL (ref 0.3–1.2)
Total Protein: 7.4 g/dL (ref 6.5–8.1)

## 2019-11-30 LAB — CBC
HCT: 40.2 % (ref 36.0–46.0)
Hemoglobin: 12.4 g/dL (ref 12.0–15.0)
MCH: 23.1 pg — ABNORMAL LOW (ref 26.0–34.0)
MCHC: 30.8 g/dL (ref 30.0–36.0)
MCV: 75 fL — ABNORMAL LOW (ref 80.0–100.0)
Platelets: 288 10*3/uL (ref 150–400)
RBC: 5.36 MIL/uL — ABNORMAL HIGH (ref 3.87–5.11)
RDW: 13.2 % (ref 11.5–15.5)
WBC: 8.5 10*3/uL (ref 4.0–10.5)
nRBC: 0 % (ref 0.0–0.2)

## 2019-11-30 LAB — URINALYSIS, COMPLETE (UACMP) WITH MICROSCOPIC
Bacteria, UA: NONE SEEN
Bilirubin Urine: NEGATIVE
Glucose, UA: NEGATIVE mg/dL
Hgb urine dipstick: NEGATIVE
Ketones, ur: NEGATIVE mg/dL
Leukocytes,Ua: NEGATIVE
Nitrite: NEGATIVE
Protein, ur: NEGATIVE mg/dL
Specific Gravity, Urine: 1.026 (ref 1.005–1.030)
pH: 7 (ref 5.0–8.0)

## 2019-11-30 LAB — LIPASE, BLOOD: Lipase: 31 U/L (ref 11–51)

## 2019-11-30 LAB — TROPONIN I (HIGH SENSITIVITY): Troponin I (High Sensitivity): <2 ng/L (ref <18)

## 2019-11-30 LAB — POC URINE PREG, ED: Preg Test, Ur: NEGATIVE

## 2019-11-30 MED ORDER — ONDANSETRON 4 MG PO TBDP
4.0000 mg | ORAL_TABLET | Freq: Once | ORAL | Status: AC | PRN
  Administered 2019-11-30: 4 mg via ORAL
  Filled 2019-11-30: qty 1

## 2019-11-30 NOTE — ED Notes (Signed)
Patient called for vital signs with no answer. 

## 2019-11-30 NOTE — ED Triage Notes (Signed)
Patient ambulatory to triage with complaints of N/V/HA for the last 2 hours, pt reports emesis x 1, HA 4/10, reports taking IBU at approx 0030, pt also c/o "hotflashes before needing to throw-up but I don't throw up"  speaking in complete coherent sentences. No acute breathing distress noted.

## 2019-11-30 NOTE — ED Notes (Signed)
Pt reports dizzy after standing to toilet, trop and EKG ordered

## 2019-11-30 NOTE — ED Notes (Signed)
Patient called for vital sign check with no answer. 

## 2019-11-30 NOTE — ED Notes (Signed)
No answer several times when called for vital sign and trop recheck.  Screener confirmed pt left

## 2020-12-31 ENCOUNTER — Other Ambulatory Visit: Payer: Self-pay

## 2020-12-31 ENCOUNTER — Emergency Department
Admission: EM | Admit: 2020-12-31 | Discharge: 2020-12-31 | Disposition: A | Discharge status: Home / Self Care | Payer: Medicaid Other | Attending: Emergency Medicine | Admitting: Emergency Medicine

## 2020-12-31 ENCOUNTER — Encounter: Payer: Self-pay | Admitting: Emergency Medicine

## 2020-12-31 DIAGNOSIS — U071 COVID-19: Secondary | ICD-10-CM | POA: Insufficient documentation

## 2020-12-31 DIAGNOSIS — Z1152 Encounter for screening for COVID-19: Secondary | ICD-10-CM

## 2020-12-31 LAB — RESP PANEL BY RT-PCR (FLU A&B, COVID) ARPGX2
Influenza A by PCR: NEGATIVE
Influenza B by PCR: NEGATIVE
SARS Coronavirus 2 by RT PCR: POSITIVE — AB

## 2020-12-31 NOTE — ED Notes (Signed)
First Nurse Note:  Pt states that she was exposed to someone with COVID and now she wants to be tested. Pt exposed on Tuesday. Pt states that she is only having chills, no other symptoms. Pt is in NAD.

## 2020-12-31 NOTE — ED Provider Notes (Signed)
[facility]  ____________________________________________   Event Date/Time   First MD Initiated Contact with Patient 12/31/20 [phone]     (approximate)  I have reviewed the triage vital signs and the nursing notes.   HISTORY  Chief Complaint Chills    HPI Debbie Mercer is a 25 y.o. female with no past medical history who presents for COVID screening.  Patient's boyfriend tested +2 days ago.  Patient was feeling well up until yesterday when she started to have fatigue and some generalized body aches.  Has not had cough congestion or shortness of breath.  Denies nausea vomiting or diarrhea.  Does continue to have some body aches and fatigue but is feeling improved today.         History reviewed. No pertinent past medical history.  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 Authorizing Provider  EPINEPHrine (AUVI-Q) 0.3 mg/0.3 mL IJ SOAJ injection Use as directed for severe allergic reaction. 11/22/17   Bobbitt, Heywood Iles, MD  fluconazole (DIFLUCAN) 150 MG tablet Take one tablet at the onset of symptoms, if still having symptoms in 3 days, take the second tablet. 04/28/19   Moshe Cipro, NP  hydrOXYzine  (ATARAX/VISTARIL) 25 MG tablet Take 0.5-1 tablets (12.5-25 mg total) by mouth every 8 (eight) hours as needed for itching. 04/06/19   Wallis Bamberg, PA-C  IBUPROFEN PO Take by mouth.    [provider]  ketoconazole (NIZORAL) 2 % cream Apply 1 application topically daily. 04/06/19   Wallis Bamberg, PA-C    Allergies Shellfish allergy  Family History  Problem Relation Age of Onset   Hypertension Mother    Allergic rhinitis Mother    Diabetes Maternal Grandmother    Anemia Maternal Grandmother    Food Allergy Sister        shellfish allergy   Angioedema Neg Hx    Asthma Neg Hx    Eczema Neg Hx    Immunodeficiency Neg Hx    Urticaria Neg Hx     Social History Social History   Tobacco Use   Smoking status: Never   Smokeless tobacco: Never  Vaping Use   Vaping Use: Never used  Substance Use Topics   Alcohol use: No   Drug use: Yes    Types: Marijuana    Comment: very occasionally    Review of Systems   Review of Systems  Constitutional:  Positive for fatigue. Negative for appetite change and fever.  Respiratory:  Negative for cough and shortness of breath.   Gastrointestinal:  Negative for abdominal pain, diarrhea, nausea and vomiting.  Musculoskeletal:  Positive for back pain and myalgias.  All other systems reviewed and are negative.  Physical Exam Updated Vital Signs BP 132/83  Pulse 98   Temp 99.7 F (37.6 C) (Oral)   Resp 19   Ht 5\' 4"  (1.626 m)   Wt 88.5 kg   LMP 12/08/2020   SpO2 97%   BMI 33.49 kg/m   Physical Exam Vitals and nursing note reviewed.  Constitutional:      General: She is not in acute distress.    Appearance: Normal appearance.  HENT:     Head: Normocephalic and atraumatic.  Eyes:     General: No scleral icterus.    Conjunctiva/sclera: Conjunctivae normal.  Pulmonary:     Effort: Pulmonary effort is normal. No respiratory distress.     Breath sounds: No stridor.  Musculoskeletal:        General: No deformity or signs of  injury.     Cervical back: Normal range of motion.  Skin:    General: Skin is dry.     Coloration: Skin is not jaundiced or pale.  Neurological:     General: No focal deficit present.     Mental Status: She is alert and oriented to person, place, and time. Mental status is at baseline.  Psychiatric:        Mood and Affect: Mood normal.        Behavior: Behavior normal.     LABS (all labs ordered are listed, but only abnormal results are displayed)  Labs Reviewed  RESP PANEL BY RT-PCR (FLU A&B, COVID) [id number]   ____________________________________________  EKG  N/a ____________________________________________  RADIOLOGY 12/10/2020, personally viewed and evaluated these images (plain radiographs) as part of my medical decision making, as well as reviewing the written report by the radiologist.  ED MD interpretation:  n/a    ____________________________________________   PROCEDURES  Procedure(s) performed (including Critical Care):  Procedures   ____________________________________________   INITIAL IMPRESSION / ASSESSMENT AND PLAN / ED COURSE     25 year old female presents primarily for COVID testing.  Her boyfriend is positive for COVID.  Patient has some mild fatigue and myalgias but is minimally symptomatic.  She appears well vital signs within normal limits.  Lungs are clear.  COVID testing was sent.  Patient will check with her MyChart.  She is stable for discharge.      ____________________________________________   FINAL CLINICAL IMPRESSION(S) / ED DIAGNOSES  Final diagnoses:  Encounter for screening for COVID-19     ED Discharge Orders     None        Note:  This document was prepared using Dragon voice recognition software and may include unintentional dictation errors.    34, MD 12/31/20 [phone]

## 2020-12-31 NOTE — ED Triage Notes (Signed)
Pt comes into the ED via POV c/o chills and body aches x 1 day.  Pt states that sometimes she feels this way before her menstrual cycle starts.  Pt does admit to a cough, but denies any sore throat.  Pt has even and unlabored respirations at this time and is ambulatory to triage.

## 2021-07-16 DIAGNOSIS — R5383 Other fatigue: Secondary | ICD-10-CM | POA: Insufficient documentation

## 2021-07-16 DIAGNOSIS — E860 Dehydration: Secondary | ICD-10-CM | POA: Insufficient documentation

## 2021-07-16 DIAGNOSIS — I951 Orthostatic hypotension: Secondary | ICD-10-CM | POA: Insufficient documentation

## 2021-07-17 ENCOUNTER — Encounter (HOSPITAL_BASED_OUTPATIENT_CLINIC_OR_DEPARTMENT_OTHER): Payer: Self-pay | Admitting: Emergency Medicine

## 2021-07-17 ENCOUNTER — Other Ambulatory Visit: Payer: Self-pay

## 2021-07-17 ENCOUNTER — Emergency Department (HOSPITAL_BASED_OUTPATIENT_CLINIC_OR_DEPARTMENT_OTHER)
Admission: EM | Admit: 2021-07-17 | Discharge: 2021-07-17 | Disposition: A | Discharge status: Home / Self Care | Payer: Self-pay | Attending: Emergency Medicine | Admitting: Emergency Medicine

## 2021-07-17 DIAGNOSIS — I951 Orthostatic hypotension: Secondary | ICD-10-CM

## 2021-07-17 DIAGNOSIS — R110 Nausea: Secondary | ICD-10-CM

## 2021-07-17 LAB — CBC
HCT: 40.4 % (ref 36.0–46.0)
Hemoglobin: 12.3 g/dL (ref 12.0–15.0)
MCH: 22.8 pg — ABNORMAL LOW (ref 26.0–34.0)
MCHC: 30.4 g/dL (ref 30.0–36.0)
MCV: 75 fL — ABNORMAL LOW (ref 80.0–100.0)
Platelets: 310 10*3/uL (ref 150–400)
RBC: 5.39 MIL/uL — ABNORMAL HIGH (ref 3.87–5.11)
RDW: 13.5 % (ref 11.5–15.5)
WBC: 7.1 10*3/uL (ref 4.0–10.5)
nRBC: 0 % (ref 0.0–0.2)

## 2021-07-17 LAB — URINALYSIS, ROUTINE W REFLEX MICROSCOPIC
Bilirubin Urine: NEGATIVE
Glucose, UA: NEGATIVE mg/dL
Ketones, ur: NEGATIVE mg/dL
Leukocytes,Ua: NEGATIVE
Nitrite: NEGATIVE
Protein, ur: NEGATIVE mg/dL
RBC / HPF: >50 RBC/hpf — ABNORMAL HIGH (ref 0–5)
Specific Gravity, Urine: 1.007 (ref 1.005–1.030)
pH: 6 (ref 5.0–8.0)

## 2021-07-17 LAB — BASIC METABOLIC PANEL
Anion gap: 12 (ref 5–15)
BUN: 5 mg/dL — ABNORMAL LOW (ref 6–20)
CO2: 24 mmol/L (ref 22–32)
Calcium: 9.9 mg/dL (ref 8.9–10.3)
Chloride: 103 mmol/L (ref 98–111)
Creatinine, Ser: 0.84 mg/dL (ref 0.44–1.00)
GFR, Estimated: >60 mL/min (ref >60)
Glucose, Bld: 90 mg/dL (ref 70–99)
Potassium: 3.5 mmol/L (ref 3.5–5.1)
Sodium: 139 mmol/L (ref 135–145)

## 2021-07-17 LAB — PREGNANCY, URINE: Preg Test, Ur: NEGATIVE

## 2021-07-17 MED ORDER — SODIUM CHLORIDE 0.9 % IV BOLUS
1000.0000 mL | Freq: Once | INTRAVENOUS | Status: AC
  Administered 2021-07-17: 1000 mL via INTRAVENOUS

## 2021-07-17 MED ORDER — PROCHLORPERAZINE EDISYLATE 10 MG/2ML IJ SOLN
10.0000 mg | Freq: Once | INTRAMUSCULAR | Status: AC
  Administered 2021-07-17: 10 mg via INTRAVENOUS
  Filled 2021-07-17: qty 2

## 2021-07-17 MED ORDER — ONDANSETRON 4 MG PO TBDP: 4.0000 mg | ORAL_TABLET | Freq: Three times a day (TID) | ORAL | 0 refills | Status: AC | PRN

## 2021-07-17 NOTE — ED Notes (Signed)
Pt felt jittery, hot and had dry mouth after meds, ice water and ice pack given, pt laid done in bed and took deep breaths, feels better.

## 2022-04-13 ENCOUNTER — Emergency Department (HOSPITAL_COMMUNITY): Payer: Self-pay

## 2022-04-13 ENCOUNTER — Other Ambulatory Visit: Payer: Self-pay

## 2022-04-13 ENCOUNTER — Encounter (HOSPITAL_COMMUNITY): Payer: Self-pay

## 2022-04-13 ENCOUNTER — Emergency Department (HOSPITAL_COMMUNITY)
Admission: EM | Admit: 2022-04-13 | Discharge: 2022-04-13 | Disposition: A | Discharge status: Home / Self Care | Payer: Self-pay | Attending: Emergency Medicine | Admitting: Emergency Medicine

## 2022-04-13 DIAGNOSIS — R110 Nausea: Secondary | ICD-10-CM | POA: Insufficient documentation

## 2022-04-13 DIAGNOSIS — R109 Unspecified abdominal pain: Secondary | ICD-10-CM | POA: Insufficient documentation

## 2022-04-13 DIAGNOSIS — K219 Gastro-esophageal reflux disease without esophagitis: Secondary | ICD-10-CM | POA: Insufficient documentation

## 2022-04-13 DIAGNOSIS — N939 Abnormal uterine and vaginal bleeding, unspecified: Secondary | ICD-10-CM | POA: Insufficient documentation

## 2022-04-13 LAB — URINALYSIS, ROUTINE W REFLEX MICROSCOPIC

## 2022-04-13 LAB — CBC WITH DIFFERENTIAL/PLATELET
Abs Immature Granulocytes: 0.01 10*3/uL (ref 0.00–0.07)
Basophils Absolute: 0.1 10*3/uL (ref 0.0–0.1)
Basophils Relative: 1 %
Eosinophils Absolute: 0.1 10*3/uL (ref 0.0–0.5)
Eosinophils Relative: 2 %
HCT: 40.8 % (ref 36.0–46.0)
Hemoglobin: 12.3 g/dL (ref 12.0–15.0)
Immature Granulocytes: 0 %
Lymphocytes Relative: 29 %
Lymphs Abs: 1.6 10*3/uL (ref 0.7–4.0)
MCH: 23.6 pg — ABNORMAL LOW (ref 26.0–34.0)
MCHC: 30.1 g/dL (ref 30.0–36.0)
MCV: 78.2 fL — ABNORMAL LOW (ref 80.0–100.0)
Monocytes Absolute: 0.8 10*3/uL (ref 0.1–1.0)
Monocytes Relative: 14 %
Neutro Abs: 3 10*3/uL (ref 1.7–7.7)
Neutrophils Relative %: 54 %
Platelets: 307 10*3/uL (ref 150–400)
RBC: 5.22 MIL/uL — ABNORMAL HIGH (ref 3.87–5.11)
RDW: 13.8 % (ref 11.5–15.5)
WBC: 5.6 10*3/uL (ref 4.0–10.5)
nRBC: 0 % (ref 0.0–0.2)

## 2022-04-13 LAB — COMPREHENSIVE METABOLIC PANEL
ALT: 10 U/L (ref 0–44)
AST: 17 U/L (ref 15–41)
Albumin: 3.8 g/dL (ref 3.5–5.0)
Alkaline Phosphatase: 47 U/L (ref 38–126)
Anion gap: 9 (ref 5–15)
BUN: 8 mg/dL (ref 6–20)
CO2: 23 mmol/L (ref 22–32)
Calcium: 9 mg/dL (ref 8.9–10.3)
Chloride: 107 mmol/L (ref 98–111)
Creatinine, Ser: 0.85 mg/dL (ref 0.44–1.00)
GFR, Estimated: >60 mL/min (ref >60)
Glucose, Bld: 92 mg/dL (ref 70–99)
Potassium: 3.7 mmol/L (ref 3.5–5.1)
Sodium: 139 mmol/L (ref 135–145)
Total Bilirubin: 0.7 mg/dL (ref 0.3–1.2)
Total Protein: 7.3 g/dL (ref 6.5–8.1)

## 2022-04-13 LAB — I-STAT BETA HCG BLOOD, ED (MC, WL, AP ONLY): I-stat hCG, quantitative: <5 m[IU]/mL (ref <5)

## 2022-04-13 LAB — WET PREP, GENITAL
Clue Cells Wet Prep HPF POC: NONE SEEN
Sperm: NONE SEEN
Trich, Wet Prep: NONE SEEN
WBC, Wet Prep HPF POC: >=10 — AB (ref <10)
Yeast Wet Prep HPF POC: NONE SEEN

## 2022-04-13 LAB — URINALYSIS, MICROSCOPIC (REFLEX): RBC / HPF: >50 RBC/hpf (ref 0–5)

## 2022-04-13 LAB — PROTIME-INR
INR: 1 (ref 0.8–1.2)
Prothrombin Time: 12.6 seconds (ref 11.4–15.2)

## 2022-04-13 NOTE — ED Provider Notes (Signed)
[facility] Provider Note   CSN: [id number] Arrival date & time: 04/13/22  [id number]     History  Chief Complaint  Patient presents with   Vaginal Bleeding    Debbie Mercer is a 27 y.o. female. With past medical history of GERD who presents to the emergency department with vaginal bleeding.  States that she had a medical abortion at the beginning of January.  She states that after the abortion she had vaginal bleeding for about 5 weeks.  She states that she then had cessation of bleeding for about a week and then started to rebleed.  States that that lasted for about 2 weeks and then stopped and again restarted the beginning of March.  She describes having bleeding about once a day.  She states that she will have small amounts of blood when she wipes and this last for about an hour and then stops.  It then returns the next day.  She does have abdominal cramping when she has bleeding.  She has had episodes of nausea without vomiting.  She denies having any lightheadedness or dizziness or shortness of breath.  She denies having dysuria.  She is not sexually active and has not been active since her medical abortion.   Vaginal Bleeding      Home Medications Prior to Admission medications   Medication Sig Start Date End Date Taking? Authorizing Provider  EPINEPHrine (AUVI-Q) 0.3 mg/0.3 mL IJ SOAJ injection Use as directed for severe allergic reaction. 11/22/17   Bobbitt, Sedalia Muta, MD  fluconazole (DIFLUCAN) 150 MG tablet Take one tablet at the onset of symptoms, if still having symptoms in 3 days, take the second tablet. 04/28/19   Faustino Congress, NP  hydrOXYzine (ATARAX/VISTARIL) 25 MG tablet Take 0.5-1 tablets (12.5-25 mg total) by mouth every 8 (eight) hours as needed for itching. 04/06/19   Jaynee Eagles, PA-C  IBUPROFEN PO Take by mouth.    [provider]  ketoconazole (NIZORAL) 2 % cream Apply 1 application topically daily.  04/06/19   Jaynee Eagles, PA-C      Allergies    Shellfish allergy    Review of Systems   Review of Systems  Genitourinary:  Positive for pelvic pain and vaginal bleeding.  All other systems reviewed and are negative.   Physical Exam Updated Vital Signs BP 111/77   Pulse 61   Temp 99.7 F (37.6 C) (Oral)   Resp 16   Ht 5\' 4"  (1.626 m)   Wt 88 kg   SpO2 100%   BMI 33.30 kg/m  Physical Exam Vitals and nursing note reviewed. Exam conducted with a chaperone present.  Constitutional:      General: She is not in acute distress.    Appearance: Normal appearance. She is not ill-appearing or toxic-appearing.  HENT:     Head: Normocephalic.  Eyes:     General: No scleral icterus.    Extraocular Movements: Extraocular movements intact.  Cardiovascular:     Rate and Rhythm: Normal rate and regular rhythm.     Pulses: Normal pulses.  Pulmonary:     Effort: Pulmonary effort is normal. No respiratory distress.  Abdominal:     General: Bowel sounds are normal. There is no distension.     Palpations: Abdomen is soft.     Tenderness: There is no abdominal tenderness.  Genitourinary:    General: Normal vulva.     Exam position: Lithotomy position.     Vagina: Normal.  Cervix: Cervical bleeding present. No cervical motion tenderness.     Adnexa:        Right: No tenderness.         Left: No tenderness.    Skin:    General: Skin is warm and dry.     Capillary Refill: Capillary refill takes less than 2 seconds.  Neurological:     General: No focal deficit present.     Mental Status: She is alert and oriented to person, place, and time.  Psychiatric:        Mood and Affect: Mood normal.        Behavior: Behavior normal.        Thought Content: Thought content normal.        Judgment: Judgment normal.     ED Results / Procedures / Treatments   Labs (all labs ordered are listed, but only abnormal results are displayed) Labs Reviewed  WET PREP, GENITAL - Abnormal; Notable  for the following components:      Result Value   WBC, Wet Prep HPF POC >=10 (*)    All other components within normal limits  CBC WITH DIFFERENTIAL/PLATELET - Abnormal; Notable for the following components:   RBC 5.22 (*)    MCV 78.2 (*)    MCH 23.6 (*)    All other components within normal limits  URINALYSIS, ROUTINE W REFLEX MICROSCOPIC - Abnormal; Notable for the following components:   Color, Urine RED (*)    APPearance TURBID (*)    Glucose, UA   (*)    Value: TEST NOT REPORTED DUE TO COLOR INTERFERENCE OF URINE PIGMENT   Hgb urine dipstick   (*)    Value: TEST NOT REPORTED DUE TO COLOR INTERFERENCE OF URINE PIGMENT   Bilirubin Urine   (*)    Value: TEST NOT REPORTED DUE TO COLOR INTERFERENCE OF URINE PIGMENT   Ketones, ur   (*)    Value: TEST NOT REPORTED DUE TO COLOR INTERFERENCE OF URINE PIGMENT   Protein, ur   (*)    Value: TEST NOT REPORTED DUE TO COLOR INTERFERENCE OF URINE PIGMENT   Nitrite   (*)    Value: TEST NOT REPORTED DUE TO COLOR INTERFERENCE OF URINE PIGMENT   Leukocytes,Ua   (*)    Value: TEST NOT REPORTED DUE TO COLOR INTERFERENCE OF URINE PIGMENT   All other components within normal limits  URINALYSIS, MICROSCOPIC (REFLEX) - Abnormal; Notable for the following components:   Bacteria, UA MANY (*)    All other components within normal limits  COMPREHENSIVE METABOLIC PANEL  PROTIME-INR  I-STAT BETA HCG BLOOD, ED ([facility], [facility], [facility] ONLY)  GC/CHLAMYDIA PROBE AMP ([place]) NOT AT [facility]    EKG None  Radiology US Pelvis Complete  Result Date: 04/13/2022 CLINICAL DATA:  27 year old with vaginal bleeding since medical abortion in January 2024. EXAM: TRANSABDOMINAL AND TRANSVAGINAL ULTRASOUND OF PELVIS DOPPLER ULTRASOUND OF OVARIES TECHNIQUE: Both transabdominal and transvaginal ultrasound examinations of the pelvis were performed. Transabdominal technique was performed for global imaging of the pelvis including uterus, ovaries, adnexal regions, and pelvic  cul-de-sac. It was necessary to proceed with endovaginal exam following the transabdominal exam to visualize the endometrium and ovaries. Color and duplex Doppler ultrasound was utilized to evaluate blood flow to the ovaries. COMPARISON:  06/30/2016 FINDINGS: Uterus Measurements: 8.3 x 4.4 x 5.2 cm = volume: 99 mL. No fibroids or other mass visualized. Endometrium Thickness: 0.7 cm.  No focal abnormality visualized. Right ovary Measurements: 3.4  x 2.1 x 2.1 cm = volume: 8 mL. Normal appearance/no adnexal mass. Left ovary Measurements: 2.7 x 1.3 x 2.1 cm = volume: 4 mL. Normal appearance/no adnexal mass. Pulsed Doppler evaluation of both ovaries demonstrates normal low-resistance arterial and venous waveforms. Other findings No abnormal free fluid. IMPRESSION: Normal pelvic ultrasound. Electronically Signed   By: Markus Daft M.D.   On: 04/13/2022 12:51   US Transvaginal Non-OB  Result Date: 04/13/2022 CLINICAL DATA:  27 year old with vaginal bleeding since medical abortion in January 2024. EXAM: TRANSABDOMINAL AND TRANSVAGINAL ULTRASOUND OF PELVIS DOPPLER ULTRASOUND OF OVARIES TECHNIQUE: Both transabdominal and transvaginal ultrasound examinations of the pelvis were performed. Transabdominal technique was performed for global imaging of the pelvis including uterus, ovaries, adnexal regions, and pelvic cul-de-sac. It was necessary to proceed with endovaginal exam following the transabdominal exam to visualize the endometrium and ovaries. Color and duplex Doppler ultrasound was utilized to evaluate blood flow to the ovaries. COMPARISON:  06/30/2016 FINDINGS: Uterus Measurements: 8.3 x 4.4 x 5.2 cm = volume: 99 mL. No fibroids or other mass visualized. Endometrium Thickness: 0.7 cm.  No focal abnormality visualized. Right ovary Measurements: 3.4 x 2.1 x 2.1 cm = volume: 8 mL. Normal appearance/no adnexal mass. Left ovary Measurements: 2.7 x 1.3 x 2.1 cm = volume: 4 mL. Normal appearance/no adnexal mass. Pulsed  Doppler evaluation of both ovaries demonstrates normal low-resistance arterial and venous waveforms. Other findings No abnormal free fluid. IMPRESSION: Normal pelvic ultrasound. Electronically Signed   By: Markus Daft M.D.   On: 04/13/2022 12:51   [place] Art/Ven Flow Abd Pelv Doppler  Result Date: 04/13/2022 CLINICAL DATA:  27 year old with vaginal bleeding since medical abortion in January 2024. EXAM: TRANSABDOMINAL AND TRANSVAGINAL ULTRASOUND OF PELVIS DOPPLER ULTRASOUND OF OVARIES TECHNIQUE: Both transabdominal and transvaginal ultrasound examinations of the pelvis were performed. Transabdominal technique was performed for global imaging of the pelvis including uterus, ovaries, adnexal regions, and pelvic cul-de-sac. It was necessary to proceed with endovaginal exam following the transabdominal exam to visualize the endometrium and ovaries. Color and duplex Doppler ultrasound was utilized to evaluate blood flow to the ovaries. COMPARISON:  06/30/2016 FINDINGS: Uterus Measurements: 8.3 x 4.4 x 5.2 cm = volume: 99 mL. No fibroids or other mass visualized. Endometrium Thickness: 0.7 cm.  No focal abnormality visualized. Right ovary Measurements: 3.4 x 2.1 x 2.1 cm = volume: 8 mL. Normal appearance/no adnexal mass. Left ovary Measurements: 2.7 x 1.3 x 2.1 cm = volume: 4 mL. Normal appearance/no adnexal mass. Pulsed Doppler evaluation of both ovaries demonstrates normal low-resistance arterial and venous waveforms. Other findings No abnormal free fluid. IMPRESSION: Normal pelvic ultrasound. Electronically Signed   By: Markus Daft M.D.   On: 04/13/2022 12:51    Procedures Procedures   Medications Ordered in ED Medications - No data to display  ED Course/ Medical Decision Making/ A&P  Medical Decision Making Amount and/or Complexity of Data Reviewed Labs: ordered. Radiology: ordered.  Initial Impression and Ddx 27 year old female who presents to the emergency department with vaginal bleeding. Patient PMH  that increases complexity of ED encounter:  GERD   Interpretation of Diagnostics I independent reviewed and interpreted the labs as followed: CMP without electrolyte dysfunction, AKI.  CBC without anemia, thrombocytopenia, coags normal, not pregnant, wet prep is negative, GC chlamydia is pending, UA is dirty/contaminated  - I independently visualized the following imaging with scope of interpretation limited to determining acute life threatening conditions related to emergency care: Pelvic ultrasound, which revealed no acute findings  Patient Reassessment and Ultimate Disposition/Management This is overall well-appearing, nonseptic and nontoxic 27 year old female presenting with vaginal bleeding.  Sounds like she is not actually using tampons or pads or pantiliners.  She has an episode of bleeding daily which has been ongoing since her medical abortion in January.  No systemic symptoms.  Will obtain labs and a pelvic exam and will likely proceed with ultrasound imaging given that she has no OB/GYN and will likely need ongoing follow-up.  Pelvic exam with what appears to be older blood in the vagina and there continues to be a small amount of oozing of blood from the cervix.  The cervix is not dilated.  She has no CMT, adnexal tenderness. Labs without anemia Coags are negative, no thrombocytopenia Not pregnant Wet prep is negative, GC chlamydia is pending UA is contaminated by blood.  She has no dysuria concerning for UTI.  Ultrasound is negative  Unclear etiology of her ongoing bleeding though I suspect this may be her body regulating after medical abortion in January.  She is not having any heavy bleeding.  She is not acutely ill or hemodynamically unstable.  No anemia here.  Feel that she is most appropriate for outpatient OB/GYN follow-up and ongoing workup for for bleeding.  May need birth control versus other management of her symptoms however no concerning findings here today.  Patient  management required discussion with the following services or consulting groups:  None  Complexity of Problems Addressed Acute complicated illness or Injury  Additional Data Reviewed and Analyzed Further history obtained from: Past medical history and medications listed in the EMR, Prior ED visit notes, Recent PCP notes, and Care Everywhere  Patient Encounter Risk Assessment SDOH impact on management  Final Clinical Impression(s) / ED Diagnoses Final diagnoses:  Vaginal bleeding    Rx / DC Orders ED Discharge Orders          Ordered    Ambulatory referral to Obstetrics / Gynecology        04/13/22 1318              Mickie Hillier, PA-C 04/13/22 1409    Carmin Muskrat, MD 04/13/22 1417

## 2022-04-13 NOTE — ED Triage Notes (Signed)
Patient here with complaint of vaginal bleeding since a medical abortion the beginning of January this year. Patient is ambulating with steady gait and is in no apparent distress.

## 2022-04-13 NOTE — Discharge Instructions (Signed)
You were seen in the emergency department today for vaginal bleeding.  Your labs and imaging are normal.  Please follow-up with [facility] to have ongoing management of your symptoms.  Please return to emergency department if you have significantly increased bleeding and are using more than 1 pad an hour over 3 hours or more.

## 2022-04-14 LAB — GC/CHLAMYDIA PROBE AMP (~~LOC~~) NOT AT ARMC
Chlamydia: NEGATIVE
Comment: NEGATIVE
Comment: NORMAL
Neisseria Gonorrhea: NEGATIVE

## 2022-09-09 ENCOUNTER — Encounter (HOSPITAL_COMMUNITY): Payer: Self-pay

## 2022-09-09 ENCOUNTER — Ambulatory Visit (HOSPITAL_COMMUNITY)
Admission: EM | Admit: 2022-09-09 | Discharge: 2022-09-09 | Disposition: A | Discharge status: Home / Self Care | Payer: Self-pay | Attending: Emergency Medicine | Admitting: Emergency Medicine

## 2022-09-09 DIAGNOSIS — N760 Acute vaginitis: Secondary | ICD-10-CM

## 2022-09-09 MED ORDER — FLUCONAZOLE 150 MG PO TABS: ORAL_TABLET | ORAL | 0 refills | Status: DC

## 2022-09-09 NOTE — Discharge Instructions (Addendum)
I am treating you with fluconazole for a yeast infection.  Take 1 tablet today and another in 3 days if your vaginal itching and discharge persist.  We have obtained a cytology swab and we will call you if we need to modify your treatment.  Please abstain from intercourse until all results are received and notify your partner if you have any gonorrhea, chlamydia or trichomoniasis.   Use unscented soaps to clean the vaginal area, and do not clean internally.  Please shower or urinating after intercourse.  If there is any anal penetration, please cleanse the penis prior to vaginal insertion.  Return to clinic for any new or urgent symptoms.

## 2022-09-09 NOTE — ED Triage Notes (Signed)
Patient c/o vaginal irritation, mild itching x 3 days and a white vaginal drainage that is clumpy since last night. Patient denies urinary frequency or dysuria.

## 2022-09-09 NOTE — ED Provider Notes (Signed)
[facility]    CSN: [id number] Arrival date & time: 09/09/22  0806      History   Chief Complaint Chief Complaint  Patient presents with   Vaginal Discharge    HPI Debbie Mercer is a 27 y.o. female.   Patient presents to clinic for 3 days of a clumpy white vaginal discharge with vaginal itching.  She denies any odor or color to her discharge.  Denies any concern for STIs.  She was recently sexually active within the last week and reports vaginal penetration, anal penetration then vaginal penetration without cleaning of the penis. Also reports changing to a scented soap recently.   Denies dysuria, fever, flank pain or hematuria.    The history is provided by the patient and medical records.  Vaginal Discharge Associated symptoms: no dysuria and no fever     History reviewed. No pertinent past medical history.  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 Authorizing Provider  EPINEPHrine (AUVI-Q) 0.3 mg/0.3 mL IJ SOAJ  injection Use as directed for severe allergic reaction. 11/22/17   Bobbitt, Heywood Iles, MD  fluconazole (DIFLUCAN) 150 MG tablet Take one tablet at the onset of symptoms, if still having symptoms in 3 days, take the second tablet. 09/09/22   Laelyn Blumenthal, [place] N, FNP  IBUPROFEN PO Take by mouth.    [provider]    Family History Family History  Problem Relation Age of Onset   Hypertension Mother    Allergic rhinitis Mother    Diabetes Maternal Grandmother    Anemia Maternal Grandmother    Food Allergy Sister        shellfish allergy   Angioedema Neg Hx    Asthma Neg Hx    Eczema Neg Hx    Immunodeficiency Neg Hx    Urticaria Neg Hx     Social History Social History   Tobacco Use   Smoking status: Never   Smokeless tobacco: Never  Vaping Use   Vaping status: Never Used  Substance Use Topics   Alcohol use: No   Drug use: Yes    Types: Marijuana    Comment: very occasionally     Allergies   Shellfish allergy   Review of Systems Review of Systems  Constitutional:  Negative  for fever.  Genitourinary:  Positive for vaginal discharge. Negative for dysuria, flank pain, genital sores, hematuria and vaginal pain.     Physical Exam Triage Vital Signs ED Triage Vitals  Encounter Vitals Group     BP 09/09/22 0829 99/66     Systolic BP Percentile --      Diastolic BP Percentile --      Pulse Rate 09/09/22 0829 85     Resp 09/09/22 0829 16     Temp 09/09/22 0829 98.9 F (37.2 C)     Temp Source 09/09/22 0829 Oral     SpO2 09/09/22 0829 97 %     Weight --      Height --      Head Circumference --      Peak Flow --      Pain Score 09/09/22 0831 0     Pain Loc --      Pain Education --      Exclude from Growth Chart --    No data found.  Updated Vital Signs BP 99/66 (BP Location: Right Arm)   Pulse 85   Temp 98.9 F (37.2 C) (Oral)   Resp 16   LMP 08/26/2022 (Approximate)   SpO2 97%   Visual Acuity Right Eye Distance:   Left Eye Distance:    Bilateral Distance:    Right Eye Near:   Left Eye Near:    Bilateral Near:     Physical Exam Vitals and nursing note reviewed.  Constitutional:      Appearance: Normal appearance.  HENT:     Head: Normocephalic and atraumatic.     Right Ear: External ear normal.     Left Ear: External ear normal.     Nose: Nose normal.     Mouth/Throat:     Mouth: Mucous membranes are moist.  Eyes:     Conjunctiva/sclera: Conjunctivae normal.  Cardiovascular:     Rate and Rhythm: Normal rate.  Pulmonary:     Effort: Pulmonary effort is normal. No respiratory distress.  Musculoskeletal:        General: Normal range of motion.  Skin:    General: Skin is warm and dry.  Neurological:     General: No focal deficit present.     Mental Status: She is alert and oriented to person, place, and time.  Psychiatric:        Mood and Affect: Mood normal.        Behavior: Behavior normal.      UC Treatments / Results  Labs (all labs ordered are listed, but only abnormal results are displayed) Labs Reviewed  CERVICOVAGINAL ANCILLARY ONLY    EKG   Radiology No results found.  Procedures Procedures (including critical care time)  Medications Ordered in UC Medications - No data to display  Initial Impression / Assessment and Plan / UC Course  I have reviewed the triage vital signs and the nursing notes.  Pertinent labs & imaging results that were available during my care of the patient were reviewed by me and considered in my medical decision making (see chart for details).  Vitals and triage reviewed, patient is hemodynamically stable.  Symptoms of thick, white, clumpy discharge and vaginal itching consistent with yeast vaginitis.  Cytology swab obtained, will start on Diflucan and staff will contact if we need to modify treatment.  Plan of care, follow-up care and return precautions given, no questions at this time.     Final Clinical Impressions(s) / UC Diagnoses  Final diagnoses:   Acute vaginitis     Discharge Instructions      I am treating you with fluconazole for a yeast infection.  Take 1 tablet today and another in 3 days if your vaginal itching and discharge persist.  We have obtained a cytology swab and we will call you if we need to modify your treatment.  Please abstain from intercourse until all results are received and notify your partner if you have any gonorrhea, chlamydia or trichomoniasis.   Use unscented soaps to clean the vaginal area, and do not clean internally.  Please shower or urinating after intercourse.  If there is any anal penetration, please cleanse the penis prior to vaginal insertion.  Return to clinic for any new or urgent symptoms.      ED Prescriptions     Medication Sig Dispense Auth. Provider   fluconazole (DIFLUCAN) 150 MG tablet Take one tablet at the onset of symptoms, if still having symptoms in 3 days, take the second tablet. 2 tablet Leshonda Galambos, [place] N, FNP      PDMP not reviewed this encounter.   Rinaldo Ratel [place] N, [place] 09/09/22 [phone]

## 2022-09-12 LAB — CERVICOVAGINAL ANCILLARY ONLY
Bacterial Vaginitis (gardnerella): NEGATIVE
Candida Glabrata: NEGATIVE
Candida Vaginitis: POSITIVE — AB
Chlamydia: NEGATIVE
Comment: NEGATIVE
Comment: NEGATIVE
Comment: NEGATIVE
Comment: NEGATIVE
Comment: NEGATIVE
Comment: NORMAL
Neisseria Gonorrhea: NEGATIVE
Trichomonas: NEGATIVE

## 2023-01-02 ENCOUNTER — Other Ambulatory Visit: Payer: Self-pay

## 2023-01-02 ENCOUNTER — Encounter (HOSPITAL_COMMUNITY): Payer: Self-pay | Admitting: *Deleted

## 2023-01-02 ENCOUNTER — Ambulatory Visit (HOSPITAL_COMMUNITY)
Admission: EM | Admit: 2023-01-02 | Discharge: 2023-01-02 | Disposition: A | Discharge status: Home / Self Care | Payer: Self-pay | Attending: Emergency Medicine | Admitting: Emergency Medicine

## 2023-01-02 DIAGNOSIS — B3731 Acute candidiasis of vulva and vagina: Secondary | ICD-10-CM

## 2023-01-02 MED ORDER — FLUCONAZOLE 150 MG PO TABS: 150.0000 mg | ORAL_TABLET | Freq: Once | ORAL | 0 refills | Status: DC | PRN

## 2023-01-02 NOTE — ED Provider Notes (Signed)
[facility]    CSN: [id number] Arrival date & time: 01/02/23  4098      History   Chief Complaint Chief Complaint  Patient presents with   Vaginal Discharge    HPI Debbie Mercer is a 27 y.o. female.  Over 1 week history of thick white vaginal discharge Reporting itching and irritation. No rash Not concerned for exposure to STD; no unprotected intercourse  LMP 11/18 No urinary symptoms  She does have history of yeast  History reviewed. No pertinent past medical history.  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 Authorizing Provider  fluconazole (DIFLUCAN) 150 MG tablet Take 1 tablet (150 mg total) by mouth once as needed for up to 2 doses (take one pill on day 1, and the second pill 3 days later). 01/02/23  Yes Mckenzey Parcell, Lurena Joiner, PA-C  EPINEPHrine (AUVI-Q) 0.3 mg/0.3 mL IJ SOAJ injection Use as directed for severe allergic reaction. 11/22/17   Bobbitt, Heywood Iles, MD  IBUPROFEN PO Take by mouth.    [provider]     Family History Family History  Problem Relation Age of Onset   Hypertension Mother    Allergic rhinitis Mother    Diabetes Maternal Grandmother    Anemia Maternal Grandmother    Food Allergy Sister        shellfish allergy   Angioedema Neg Hx    Asthma Neg Hx    Eczema Neg Hx    Immunodeficiency Neg Hx    Urticaria Neg Hx     Social History Social History   Tobacco Use   Smoking status: Never   Smokeless tobacco: Never  Vaping Use   Vaping status: Never Used  Substance Use Topics   Alcohol use: No   Drug use: Yes    Types: Marijuana    Comment: very occasionally     Allergies   Shellfish allergy   Review of Systems Review of Systems  Genitourinary:  Positive for vaginal discharge.   Per HPI  Physical Exam Triage Vital Signs ED Triage Vitals  Encounter Vitals Group     BP 01/02/23 0906 107/72     Systolic BP Percentile --      Diastolic BP Percentile --      Pulse Rate 01/02/23 0906  80     Resp 01/02/23 0906 20     Temp 01/02/23 0906 99.4 F (37.4 C)     Temp src --      SpO2 01/02/23 0906 98 %     Weight --      Height --      Head Circumference --      Peak Flow --      Pain Score 01/02/23 0904 0     Pain Loc --      Pain Education --      Exclude from Growth Chart --    No data found.  Updated Vital Signs BP 107/72   Pulse 80   Temp 99.4 F (37.4 C)   Resp 20   LMP 12/12/2022 (Approximate)   SpO2 98%     Physical Exam Vitals and nursing note reviewed.  Constitutional:      General: She is not in acute distress.    Appearance: Normal appearance.  Cardiovascular:     Rate and Rhythm: Normal rate and regular rhythm.     Heart sounds: Normal heart sounds.  Pulmonary:     Effort: Pulmonary effort is normal.     Breath sounds: Normal breath sounds.  Neurological:     Mental Status: She is alert and oriented to person, place, and time.     UC Treatments / Results  Labs (all labs ordered are listed, but only abnormal  results are displayed) Labs Reviewed - No data to display  EKG   Radiology No results found.  Procedures Procedures (including critical care time)  Medications Ordered in UC Medications - No data to display  Initial Impression / Assessment and Plan / UC Course  I have reviewed the triage vital signs and the nursing notes.  Pertinent labs & imaging results that were available during my care of the patient were reviewed by me and considered in my medical decision making (see chart for details).  Treat for yeast infection with fluconazole 2 dose No testing needed today Patient without questions at this time, can return if needed  Final Clinical Impressions(s) / UC Diagnoses   Final diagnoses:  Vaginal yeast infection     Discharge Instructions      Fluconazole has been sent to pharmacy      ED Prescriptions     Medication Sig Dispense Auth. Provider   fluconazole (DIFLUCAN) 150 MG tablet Take 1 tablet (150 mg total) by mouth once as needed for up to 2 doses (take one pill on day 1, and the second pill 3 days later). 2 tablet Rondarius Kadrmas, Lurena Joiner, PA-C      PDMP not reviewed this encounter.   Marlow Baars, [place] 01/02/23 7628

## 2023-01-02 NOTE — Discharge Instructions (Addendum)
Fluconazole has been sent to pharmacy.

## 2023-01-02 NOTE — ED Triage Notes (Signed)
Pt reports a white ,thick Vaginal discharge fro 11/2 weeks. Pt reports skin is irritated and itches.

## 2023-01-05 ENCOUNTER — Emergency Department (HOSPITAL_BASED_OUTPATIENT_CLINIC_OR_DEPARTMENT_OTHER)
Admission: EM | Admit: 2023-01-05 | Discharge: 2023-01-05 | Disposition: A | Discharge status: Home / Self Care | Payer: Self-pay | Attending: Emergency Medicine | Admitting: Emergency Medicine

## 2023-01-05 ENCOUNTER — Encounter (HOSPITAL_BASED_OUTPATIENT_CLINIC_OR_DEPARTMENT_OTHER): Payer: Self-pay

## 2023-01-05 ENCOUNTER — Other Ambulatory Visit: Payer: Self-pay

## 2023-01-05 DIAGNOSIS — Z1152 Encounter for screening for COVID-19: Secondary | ICD-10-CM | POA: Insufficient documentation

## 2023-01-05 DIAGNOSIS — J069 Acute upper respiratory infection, unspecified: Secondary | ICD-10-CM | POA: Insufficient documentation

## 2023-01-05 LAB — RESP PANEL BY RT-PCR (RSV, FLU A&B, COVID)  RVPGX2
Influenza A by PCR: NEGATIVE
Influenza B by PCR: NEGATIVE
Resp Syncytial Virus by PCR: NEGATIVE
SARS Coronavirus 2 by RT PCR: NEGATIVE

## 2023-01-05 LAB — GROUP A STREP BY PCR: Group A Strep by PCR: NOT DETECTED

## 2023-01-05 MED ORDER — AMOXICILLIN 500 MG PO CAPS: 500.0000 mg | ORAL_CAPSULE | Freq: Three times a day (TID) | ORAL | 0 refills | Status: DC

## 2023-01-05 NOTE — Discharge Instructions (Signed)
You tested negative for COVID, flu, RSV, and strep.  This is good news.  This is likely a viral illness.  However, considering that you were initially getting better and then have now gotten significantly worse and going to write you a prescription for antibiotics which will cover you for any bacterial infection.  I would like for you to follow-up with your primary care doctor if you have one.  You may return to the emergency department for any worsening symptoms.

## 2023-01-05 NOTE — ED Provider Notes (Signed)
[place] EMERGENCY DEPARTMENT AT [facility] Provider Note   CSN: [id number] Arrival date & time: 01/05/23  6962     History Chief Complaint  Patient presents with   Cough    Debbie Mercer is a 27 y.o. female patient with no significant past medical history who presents to the emergency department with a 2-week history of cough, shortness of breath, nasal congestion, sore throat.  Patient states that symptoms got better after 1 week and started getting worse again.  She denies fever, chills, abdominal pain, nausea, vomiting, diarrhea, urinary symptoms, vaginal symptoms.  Denies sick contacts.  Has been taking over-the-counter medicine with little relief.   Cough      Home Medications Prior to Admission medications   Medication Sig Start Date End Date Taking? Authorizing Provider  amoxicillin (AMOXIL) 500 MG capsule Take 1 capsule (500 mg total) by mouth 3 (three) times daily. 01/05/23  Yes Meredeth Ide, Lucciana Head M, PA-C  EPINEPHrine (AUVI-Q) 0.3 mg/0.3 mL IJ SOAJ injection Use as directed for severe allergic reaction. 11/22/17  Yes Bobbitt, Heywood Iles, MD  fluconazole (DIFLUCAN) 150 MG tablet Take 1 tablet (150 mg total) by mouth once as needed for up to 2 doses (take one pill on day 1, and the second pill 3 days later). 01/02/23  Yes Rising, Rebecca, PA-C  IBUPROFEN PO Take by mouth.   Yes [provider]      Allergies    Shellfish allergy    Review of Systems   Review of Systems  Respiratory:  Positive for cough.   All other systems reviewed and are negative.   Physical Exam Updated Vital Signs BP 117/79 (BP Location: Right Arm)   Pulse 77   Temp 98.3 F (36.8 C) (Oral)   Resp 16   Ht 5\' 4"  (1.626 m)   Wt 79.4 kg   LMP 12/12/2022 (Approximate)   SpO2 100%   BMI 30.04 kg/m  Physical Exam Vitals and nursing note reviewed.  Constitutional:      General: She is not in acute distress.    Appearance: Normal appearance.  HENT:     Head:  Normocephalic and atraumatic.     Mouth/Throat:     Comments: There is posterior pharyngeal erythema.  Uvula is midline.  There is mild bilateral tonsillar hypertrophy with evidence of exudate. Eyes:     General:        Right eye: No discharge.        Left eye: No discharge.  Cardiovascular:     Comments: Regular rate and rhythm.  S1/S2 are distinct without any evidence of murmur, rubs, or gallops.  Radial pulses are 2+ bilaterally.  Dorsalis pedis pulses are 2+ bilaterally.  No evidence of pedal edema. Pulmonary:     Comments: Clear to auscultation bilaterally.  Normal effort.  No respiratory distress.  No evidence of wheezes, rales, or rhonchi heard throughout. Abdominal:     General: Abdomen is flat. Bowel sounds are normal. There is no distension.     Tenderness: There is no abdominal tenderness. There is no guarding or rebound.  Musculoskeletal:        General: Normal range of motion.     Cervical back: Neck supple.  Skin:    General: Skin is warm and dry.     Findings: No rash.  Neurological:     General: No focal deficit present.     Mental Status: She is alert.  Psychiatric:        Mood  and Affect: Mood normal.        Behavior: Behavior normal.     ED Results / Procedures / Treatments   Labs (all labs ordered are listed, but only abnormal results are displayed) Labs Reviewed  RESP PANEL BY RT-PCR (RSV, FLU A&B, COVID)  [id number]  GROUP A STREP BY PCR    EKG None  Radiology No results found.  Procedures Procedures    Medications Ordered in ED Medications - No data to display  ED Course/ Medical Decision Making/ A&P Clinical Course as of 01/05/23 1059  Thu Jan 05, 2023  1055 Resp panel by RT-PCR (RSV, Flu A&B, Covid) Anterior Nasal Swab Negative.  [CF]  1055 Group A Strep by PCR Negative.  [CF]    Clinical Course User Index [CF] Teressa Lower, PA-C   {   Click here for ABCD2, HEART and other calculators  Medical Decision Making Debbie Mercer  is a 27 y.o. female patient who presents to the emergency department today for further evaluation of upper respiratory type symptoms.  Patient's lungs are clear to auscultation I have a low suspicion for pneumonia at this time.  Patient's vital signs are otherwise normal.  Do not feel that chest x-ray is warranted right now.  Will swab her for COVID, flu, RSV, and strep considering that the patient does have some mild posterior pharyngeal erythema.  No evidence of tonsillar abscess.  She is not toxic appearing and have a low suspicion for sepsis, RPA.  If considering viral bronchitis but considering the biphasic nature of her symptom course may consider antibiotics after labs result.  Patient feels about the same.  Will treat for any bacterial causes with a short course of amoxicillin.  Patient agreeable with plan.  Strict turn precautions were discussed.  She is safe for discharge.  Amount and/or Complexity of Data Reviewed Labs:  Decision-making details documented in ED Course.  Risk Prescription drug management.    Final Clinical Impression(s) / ED Diagnoses Final diagnoses:  Upper respiratory tract infection, unspecified type    Rx / DC Orders ED Discharge Orders          Ordered    amoxicillin (AMOXIL) 500 MG capsule  3 times daily        01/05/23 [address] [place], [place] 01/05/23 1100    Benjiman Core, MD 01/09/23 1442

## 2023-01-05 NOTE — ED Triage Notes (Signed)
In for eval of cough, sore throat, left eye drainage, and bilateral ear fullness/pain. Productive cough with yellow/green/brown sputum and started 2 weeks ago. Nasal congestion, chills at night, no fever. Headaches.

## 2023-01-05 NOTE — ED Notes (Signed)
ED Provider at bedside. 

## 2023-04-19 ENCOUNTER — Encounter (HOSPITAL_COMMUNITY): Payer: Self-pay | Admitting: *Deleted

## 2023-04-19 ENCOUNTER — Other Ambulatory Visit: Payer: Self-pay

## 2023-04-19 ENCOUNTER — Ambulatory Visit (HOSPITAL_COMMUNITY)
Admission: EM | Admit: 2023-04-19 | Discharge: 2023-04-19 | Disposition: A | Discharge status: Home / Self Care | Payer: Self-pay | Attending: Emergency Medicine | Admitting: Emergency Medicine

## 2023-04-19 DIAGNOSIS — B3731 Acute candidiasis of vulva and vagina: Secondary | ICD-10-CM

## 2023-04-19 DIAGNOSIS — N898 Other specified noninflammatory disorders of vagina: Secondary | ICD-10-CM

## 2023-04-19 MED ORDER — FLUCONAZOLE 150 MG PO TABS: ORAL_TABLET | ORAL | 0 refills | Status: DC

## 2023-04-19 NOTE — Discharge Instructions (Addendum)
 I have prescribed diflucan for yeast infection coverage. Take one tablet today and the second tablet in 3 days if symptoms persist.   Your results will come back over the next few days and someone will call if results are positive and require adjustment to treatment plan.  Return here as needed.

## 2023-04-19 NOTE — ED Provider Notes (Signed)
 [facility]    CSN: [id number] Arrival date & time: 04/19/23  0809      History   Chief Complaint Chief Complaint  Patient presents with   Vaginal Discharge    HPI Debbie Mercer is a 28 y.o. female.   Patient presents with vaginal discharge and itching x 3 days. Patient reports thick, white clumpy discharge. Denies any concerns for STDs at this time. Denies abdominal pain, abnormal vaginal bleeding, dysuria, urinary frequency/urgency, and hematuria.    Vaginal Discharge   History reviewed. No pertinent past medical history.  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 Authorizing Provider  fluconazole (DIFLUCAN) 150 MG tablet Take one tablet today and one tablet in 3 days if symptoms persist. 04/19/23  Yes Wynonia Lawman A, NP  EPINEPHrine (AUVI-Q) 0.3 mg/0.3 mL IJ SOAJ injection Use as directed for severe allergic reaction. 11/22/17   Bobbitt, Heywood Iles, MD    Family History Family History  Problem Relation Age of  Onset   Hypertension Mother    Allergic rhinitis Mother    Diabetes Maternal Grandmother    Anemia Maternal Grandmother    Food Allergy Sister        shellfish allergy   Angioedema Neg Hx    Asthma Neg Hx    Eczema Neg Hx    Immunodeficiency Neg Hx    Urticaria Neg Hx     Social History Social History   Tobacco Use   Smoking status: Never   Smokeless tobacco: Never  Vaping Use   Vaping status: Never Used  Substance Use Topics   Alcohol use: No   Drug use: Yes    Types: Marijuana    Comment: very occasionally     Allergies   Shellfish allergy   Review of Systems Review of Systems  Genitourinary:  Positive for vaginal discharge.   Per HPI  Physical Exam Triage Vital Signs ED Triage Vitals  Encounter Vitals Group     BP 04/19/23 0847 108/75     Systolic BP Percentile --      Diastolic BP Percentile --      Pulse Rate 04/19/23 0847 84     Resp 04/19/23 0847 20     Temp 04/19/23 0847 99.3 F (37.4 C)  Temp src --      SpO2 04/19/23 0847 98 %     Weight --      Height --      Head Circumference --      Peak Flow --      Pain Score 04/19/23 0841 0     Pain Loc --      Pain Education --      Exclude from Growth Chart --    No data found.  Updated Vital Signs BP 108/75   Pulse 84   Temp 99.3 F (37.4 C)   Resp 20   LMP 03/30/2023 Comment: PT has had irregular periods  SpO2 98%   Visual Acuity Right Eye Distance:   Left Eye Distance:   Bilateral Distance:    Right Eye Near:   Left Eye Near:    Bilateral Near:     Physical Exam Vitals and nursing note reviewed.  Constitutional:      General: She is awake. She is not in acute distress.    Appearance: Normal appearance. She is well-developed and well-groomed. She is not ill-appearing.  Genitourinary:    Comments: Exam deferred.  Neurological:     Mental Status: She is alert.  Psychiatric:        Behavior: Behavior is cooperative.      UC Treatments / Results  Labs (all labs  ordered are listed, but only abnormal results are displayed) Labs Reviewed  CERVICOVAGINAL ANCILLARY ONLY    EKG   Radiology No results found.  Procedures Procedures (including critical care time)  Medications Ordered in UC Medications - No data to display  Initial Impression / Assessment and Plan / UC Course  I have reviewed the triage vital signs and the nursing notes.  Pertinent labs & imaging results that were available during my care of the patient were reviewed by me and considered in my medical decision making (see chart for details).     GU exam deferred. Patient performed self swab for BV/yeast. Prescribed Diflucan for yeast coverage. Discussed follow-up and return precautions.  Final Clinical Impressions(s) / UC Diagnoses   Final diagnoses:  Yeast vaginitis  Vaginal discharge     Discharge Instructions      I have prescribed diflucan for yeast infection coverage. Take one tablet today and the second tablet in 3 days if symptoms persist.   Your results will come back over the next few days and someone will call if results are positive and require adjustment to treatment plan.  Return here as needed.     ED Prescriptions     Medication Sig Dispense Auth. Provider   fluconazole (DIFLUCAN) 150 MG tablet Take one tablet today and one tablet in 3 days if symptoms persist. 2 tablet Wynonia Lawman A, NP      PDMP not reviewed this encounter.   Wynonia Lawman A, NP 04/19/23 [phone]

## 2023-04-19 NOTE — ED Triage Notes (Signed)
 PT reports a vag discharge for 3 days that is thick and white.

## 2023-04-20 LAB — CERVICOVAGINAL ANCILLARY ONLY
Bacterial Vaginitis (gardnerella): NEGATIVE
Candida Glabrata: NEGATIVE
Candida Vaginitis: POSITIVE — AB
Comment: NEGATIVE
Comment: NEGATIVE
Comment: NEGATIVE

## 2023-06-20 ENCOUNTER — Encounter (HOSPITAL_COMMUNITY): Payer: Self-pay | Admitting: *Deleted

## 2023-06-20 ENCOUNTER — Ambulatory Visit (HOSPITAL_COMMUNITY)
Admission: EM | Admit: 2023-06-20 | Discharge: 2023-06-20 | Disposition: A | Discharge status: Home / Self Care | Payer: Self-pay | Attending: Emergency Medicine | Admitting: Emergency Medicine

## 2023-06-20 DIAGNOSIS — B3731 Acute candidiasis of vulva and vagina: Secondary | ICD-10-CM

## 2023-06-20 MED ORDER — FLUCONAZOLE 150 MG PO TABS: 150.0000 mg | ORAL_TABLET | Freq: Once | ORAL | 0 refills | Status: DC | PRN

## 2023-06-20 NOTE — Discharge Instructions (Signed)
 Fluconazole  2 dose sent to pharmacy Call ob/gyn to make appointment!

## 2023-06-20 NOTE — ED Triage Notes (Signed)
 Pt states she has clumpy discharge and vaginal irritation X 3 days.

## 2023-06-20 NOTE — ED Provider Notes (Signed)
 [facility]    CSN: [id number] Arrival date & time: 06/20/23  0820      History   Chief Complaint Chief Complaint  Patient presents with   Vaginitis    HPI Debbie Mercer is a 28 y.o. female.   3 day history of vaginal itching, irritation, clumpy white discharge History of yeast infection similar to this No concern for STD No urinary symptoms LMP 5/10  History reviewed. No pertinent past medical history.  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 Authorizing Provider  fluconazole  (DIFLUCAN ) 150 MG tablet Take 1 tablet (150 mg total) by mouth once as needed for up to 2 doses (take one pill on day 1, and the second pill 3 days later). 06/20/23  Yes Izaiyah Kleinman, Ivette Marks, PA-C  EPINEPHrine  (AUVI-Q ) 0.3 mg/0.3 mL IJ SOAJ injection Use as directed for severe allergic reaction. 11/22/17   Bobbitt, Colen Daunt, MD    Family History Family History  Problem Relation Age of Onset   Hypertension Mother    Allergic rhinitis Mother     Diabetes Maternal Grandmother    Anemia Maternal Grandmother    Food Allergy Sister        shellfish allergy   Angioedema Neg Hx    Asthma Neg Hx    Eczema Neg Hx    Immunodeficiency Neg Hx    Urticaria Neg Hx     Social History Social History   Tobacco Use   Smoking status: Never   Smokeless tobacco: Never  Vaping Use   Vaping status: Never Used  Substance Use Topics   Alcohol use: No   Drug use: Yes    Types: Marijuana    Comment: very occasionally     Allergies   Shellfish allergy   Review of Systems Review of Systems  As per HPI  Physical Exam Triage Vital Signs ED Triage Vitals  Encounter Vitals Group     BP 06/20/23 0929 (!) 105/59     Systolic BP Percentile --      Diastolic BP Percentile --      Pulse Rate 06/20/23 0929 72     Resp 06/20/23 0929 18     Temp 06/20/23 0929 99.5 F (37.5 C)     Temp Source 06/20/23 0929 Oral  SpO2 06/20/23 0929 97 %     Weight --      Height --      Head Circumference --      Peak Flow --      Pain Score 06/20/23 0928 0     Pain Loc --      Pain Education --      Exclude from Growth Chart --    No data found.  Updated Vital Signs BP (!) 105/59 (BP Location: Right Arm)   Pulse 72   Temp 99.5 F (37.5 C) (Oral)   Resp 18   LMP 06/03/2023 (Approximate)   SpO2 97%   Visual Acuity Right Eye Distance:   Left Eye Distance:   Bilateral Distance:    Right Eye Near:   Left Eye Near:    Bilateral Near:     Physical Exam Vitals and nursing note reviewed.  Constitutional:      General: She is not in acute distress.    Appearance: Normal appearance.  HENT:     Mouth/Throat:     Pharynx: Oropharynx is clear.  Cardiovascular:     Rate and Rhythm: Normal rate and regular rhythm.     Heart sounds: Normal heart sounds.  Pulmonary:     Effort: Pulmonary effort is normal.     Breath sounds: Normal breath sounds.  Neurological:     Mental Status: She is alert and oriented to person, place, and time.       UC Treatments / Results  Labs (all labs ordered are listed, but only abnormal results are displayed) Labs Reviewed - No data to display  EKG   Radiology No results found.  Procedures Procedures (including critical care time)  Medications Ordered in UC Medications - No data to display  Initial Impression / Assessment and Plan / UC Course  I have reviewed the triage vital signs and the nursing notes.  Pertinent labs & imaging results that were available during my care of the patient were reviewed by me and considered in my medical decision making (see chart for details).  Treat for yeast vaginitis with 2 dose fluconazole  Defers testing today Ob/gyn info given for follow up Patient without questions at this time  Final Clinical Impressions(s) / UC Diagnoses   Final diagnoses:  Yeast vaginitis     Discharge Instructions      Fluconazole  2 dose sent to pharmacy Call ob/gyn to make appointment!  ED Prescriptions     Medication Sig Dispense Auth. Provider   fluconazole  (DIFLUCAN ) 150 MG tablet Take 1 tablet (150 mg total) by mouth once as needed for up to 2 doses (take one pill on day 1, and the second pill 3 days later). 2 tablet Willona Phariss, Ivette Marks, PA-C      PDMP not reviewed this encounter.   Creighton Doffing, PA-C 06/20/23 1047

## 2023-06-21 ENCOUNTER — Telehealth (HOSPITAL_COMMUNITY): Payer: Self-pay

## 2023-06-21 MED ORDER — FLUCONAZOLE 150 MG PO TABS: 150.0000 mg | ORAL_TABLET | Freq: Once | ORAL | 0 refills | Status: AC | PRN

## 2023-06-21 NOTE — Telephone Encounter (Signed)
 Pharmacy changed per pt request

## 2023-11-04 ENCOUNTER — Other Ambulatory Visit: Payer: Self-pay

## 2023-11-04 ENCOUNTER — Encounter (HOSPITAL_BASED_OUTPATIENT_CLINIC_OR_DEPARTMENT_OTHER): Payer: Self-pay

## 2023-11-04 ENCOUNTER — Encounter (HOSPITAL_COMMUNITY): Payer: Self-pay | Admitting: *Deleted

## 2023-11-04 ENCOUNTER — Emergency Department (HOSPITAL_COMMUNITY)
Admission: EM | Admit: 2023-11-04 | Discharge: 2023-11-04 | Discharge status: Against Medical Advice | Payer: Self-pay | Attending: Emergency Medicine | Admitting: Emergency Medicine

## 2023-11-04 ENCOUNTER — Emergency Department (HOSPITAL_BASED_OUTPATIENT_CLINIC_OR_DEPARTMENT_OTHER)
Admission: EM | Admit: 2023-11-04 | Discharge: 2023-11-04 | Disposition: A | Discharge status: Home / Self Care | Payer: Self-pay | Attending: Emergency Medicine | Admitting: Emergency Medicine

## 2023-11-04 DIAGNOSIS — Z3201 Encounter for pregnancy test, result positive: Secondary | ICD-10-CM | POA: Insufficient documentation

## 2023-11-04 DIAGNOSIS — J029 Acute pharyngitis, unspecified: Secondary | ICD-10-CM | POA: Insufficient documentation

## 2023-11-04 DIAGNOSIS — Z5321 Procedure and treatment not carried out due to patient leaving prior to being seen by health care provider: Secondary | ICD-10-CM | POA: Insufficient documentation

## 2023-11-04 DIAGNOSIS — J039 Acute tonsillitis, unspecified: Secondary | ICD-10-CM | POA: Insufficient documentation

## 2023-11-04 DIAGNOSIS — O99891 Other specified diseases and conditions complicating pregnancy: Secondary | ICD-10-CM | POA: Insufficient documentation

## 2023-11-04 DIAGNOSIS — J358 Other chronic diseases of tonsils and adenoids: Secondary | ICD-10-CM

## 2023-11-04 LAB — RESP PANEL BY RT-PCR (RSV, FLU A&B, COVID)  RVPGX2
Influenza A by PCR: NEGATIVE
Influenza B by PCR: NEGATIVE
Resp Syncytial Virus by PCR: NEGATIVE
SARS Coronavirus 2 by RT PCR: NEGATIVE

## 2023-11-04 LAB — GROUP A STREP BY PCR
Group A Strep by PCR: NOT DETECTED
Group A Strep by PCR: NOT DETECTED

## 2023-11-04 LAB — PREGNANCY, URINE: Preg Test, Ur: POSITIVE — AB

## 2023-11-04 LAB — POC URINE PREG, ED: Preg Test, Ur: POSITIVE — AB

## 2023-11-04 MED ORDER — METOCLOPRAMIDE HCL 10 MG PO TABS: 10.0000 mg | ORAL_TABLET | Freq: Four times a day (QID) | ORAL | 0 refills | Status: AC

## 2023-11-04 MED ORDER — METOCLOPRAMIDE HCL 10 MG PO TABS
10.0000 mg | ORAL_TABLET | Freq: Once | ORAL | Status: AC
  Administered 2023-11-04: 10 mg via ORAL
  Filled 2023-11-04: qty 1

## 2023-11-04 NOTE — Discharge Instructions (Addendum)
 You were seen today for nausea, sore throat and also having a positive pregnancy test.  You will need to follow-up with both OB/GYN as well as a PCP.  Have attached both of their information to this after the summary for you to call their offices and schedule an appointment.  Would recommend you continue to eat small meals throughout the day to help with blood sugars which will also help with your nausea.  However I have symptoms of nausea medication for you to use as needed.  You can use menthol  lozenges as well as honey for sore throat.  Additionally drink plenty of fluids to help with sore throat.  Recommend you continue to follow-up with PCP for further management.  Return to ED though if concerning new or worsening symptoms which include fever, chest pain, shortness of breath, vaginal discharge, persistent vomiting that is uncontrolled with medications, vaginal bleeding.

## 2023-11-04 NOTE — ED Provider Notes (Signed)
 [place] EMERGENCY DEPARTMENT AT [facility] Provider Note   CSN: [id number] Arrival date & time: 11/04/23  2022     Patient presents with: Sore Throat   Debbie Mercer is a 28 y.o. female.  HPI Patient is a 28 year old female to the ED today for concerns for sore throat x 4 days accompanied with some congestion.  Also wishing to have a pregnancy test done as she is 1 week late for her period with last period on 8/28.  Endorses nausea.  Previous history of GERD, THC abuse.  Denies fever, headache, vision changes, tinnitus, chest pain, shortness of breath, abdominal pain, vomiting, diarrhea, melena, hematochezia, dysuria, vaginal discharge, vaginal bleeding, lower leg swelling.    Prior to Admission medications   Medication Sig Start Date End Date Taking? Authorizing Provider  metoCLOPramide  (REGLAN ) 10 MG tablet Take 1 tablet (10 mg total) by mouth every 6 (six) hours. 11/04/23  Yes Brenan Modesto S, PA-C  EPINEPHrine  (AUVI-Q ) 0.3 mg/0.3 mL IJ SOAJ injection Use as directed for severe allergic reaction. 11/22/17   Bobbitt, Elgin Pepper, MD  fluconazole  (DIFLUCAN ) 150 MG tablet Take 1 tablet (150 mg total) by mouth once as needed for up to 2 doses (take one pill on day 1, and the second pill 3 days later). 06/21/23   Rising, Asberry, PA-C    Allergies: Shellfish allergy    Review of Systems  HENT:  Positive for sore throat.   All other systems reviewed and are negative.   Updated Vital Signs BP 112/67   Pulse 90   Temp 99.8 F (37.7 C) (Oral)   Resp 15   LMP 10/04/2023 (Exact Date)   SpO2 100%   Physical Exam Vitals and nursing note reviewed.  Constitutional:      General: She is not in acute distress.    Appearance: Normal appearance. She is not ill-appearing or diaphoretic.  HENT:     Head: Normocephalic and atraumatic.     Mouth/Throat:     Mouth: Mucous membranes are moist.     Pharynx: Oropharyngeal exudate and posterior oropharyngeal erythema  present.  Eyes:     General: No scleral icterus.       Right eye: No discharge.        Left eye: No discharge.     Extraocular Movements: Extraocular movements intact.     Conjunctiva/sclera: Conjunctivae normal.     Pupils: Pupils are equal, round, and reactive to light.  Cardiovascular:     Rate and Rhythm: Normal rate and regular rhythm.     Pulses: Normal pulses.     Heart sounds: Normal heart sounds. No murmur heard.    No friction rub. No gallop.  Pulmonary:     Effort: Pulmonary effort is normal. No respiratory distress.     Breath sounds: No stridor. No wheezing, rhonchi or rales.  Chest:     Chest wall: No tenderness.  Abdominal:     General: Abdomen is flat. There is no distension.     Palpations: Abdomen is soft.     Tenderness: There is no abdominal tenderness. There is no right CVA tenderness, left CVA tenderness, guarding or rebound.  Musculoskeletal:        General: No swelling, deformity or signs of injury.     Cervical back: Normal range of motion. No rigidity.     Right lower leg: No edema.     Left lower leg: No edema.  Skin:    General: Skin is warm and  dry.     Findings: No bruising, erythema or lesion.  Neurological:     General: No focal deficit present.     Mental Status: She is alert and oriented to person, place, and time. Mental status is at baseline.     Sensory: No sensory deficit.     Motor: No weakness.  Psychiatric:        Mood and Affect: Mood normal.     (all labs ordered are listed, but only abnormal results are displayed) Labs Reviewed  PREGNANCY, URINE - Abnormal; Notable for the following components:      Result Value   Preg Test, Ur POSITIVE (*)    All other components within normal limits  RESP PANEL BY RT-PCR (RSV, FLU A&B, COVID)  [id number]  GROUP A STREP BY PCR    EKG: None  Radiology: No results found.  Procedures   Medications Ordered in the ED  metoCLOPramide  (REGLAN ) tablet 10 mg (10 mg Oral Given 11/04/23 2131)       Medical Decision Making  This patient is a 28 year old female who presents to the ED for concern of nausea, sore throat x 4 days, concerned that she also may be pregnant.     On physical exam, patient is in no acute distress, afebrile, alert and orient x 4, speaking in full sentences, nontachypneic, nontachycardic.  LCTAB, RRR, no murmur, no abdominal tenderness to palpation, no rashes.  Notably does have white exudate present on tonsils bilaterally 2+.  Unremarkable exam otherwise.  Strep test negative, respiratory panel negative, pregnancy test positive.  Provided OB/GYN information as well as PCP information for her to follow-up with.  Low suspicion for STI as cause of patient's pharyngitis, and with patient only having had symptoms for the last 4 days, monospot would likely be negative.  Will have her continue follow-up PCP for further management as well as with OB/GYN, provided Reglan  for nausea as needed. Provided show return to ER precautions.  Case was discussed with attending who agreed with plan.   Patient vital signs have remained stable throughout the course of patient's time in the ED. Low suspicion for any other emergent pathology at this time. I believe this patient is safe to be discharged. Provided strict return to ER precautions. Patient expressed agreement and understanding of plan. All questions were answered.    Differential diagnoses prior to evaluation: The emergent differential diagnosis includes, but is not limited to,  upper respiratory infection, lower respiratory infection, allergies, asthma, irritants, sinus/esophageal foreign body, interstitial lung disease, pharyngitis, meningitis. This is not an exhaustive differential.   Past Medical History / Co-morbidities / Social History: THC abuse, GERD  Additional history: Chart reviewed. Pertinent results include:   Eloped from the emergency department earlier today.   Lab Tests/Imaging studies: I  personally interpreted labs/imaging and the pertinent results include:    Pregnancy test positive. Strep test negative Respiratory panel negative  Medications: I ordered medication including Reglan .  I have reviewed the patients home medicines and have made adjustments as needed.  Critical Interventions: None  Social Determinants of Health: Currently does not have a PCP or a OB/GYN that she sees  Disposition: After consideration of the diagnostic results and the patients response to treatment, I feel that the patient would benefit from discharge and treatment as above.   emergency department workup does not suggest an emergent condition requiring admission or immediate intervention beyond what has been performed at this time. The plan is: Follow-up with OB/GYN, follow-up PCP,  Reglan  as needed, symptomatic management, return to the ER for any new or worsening symptoms. The patient is safe for discharge and has been instructed to return immediately for worsening symptoms, change in symptoms or any other concerns.   Final diagnoses:  Positive pregnancy test  Sore throat  Tonsillar exudate    ED Discharge Orders          Ordered    metoCLOPramide  (REGLAN ) 10 MG tablet  Every 6 hours        11/04/23 2133               Beola Terrall RAMAN, PA-C 11/04/23 2137    Pamella Ozell LABOR, DO 11/08/23 [phone]

## 2023-11-04 NOTE — ED Triage Notes (Signed)
 Pt states that she has had sore throat x 4 days and also wants a pregnancy test.

## 2023-11-04 NOTE — ED Provider Triage Note (Signed)
 Emergency Medicine Provider Triage Evaluation Note  Debbie Mercer , a 28 y.o. female  was evaluated in triage.  Pt complains of sore throat. Reports sx x 3 days. No sick contacts. Swallowing without issue. Requesting a pregnancy test as well. No abdominal pain  Review of Systems  Positive:  Negative:   Physical Exam  BP 121/80 (BP Location: Right Arm)   Pulse (!) 103   Temp 99.9 F (37.7 C)   Resp 16   SpO2 100%  Gen:   Awake, no distress   Resp:  Normal effort  MSK:   Moves extremities without difficulty  Other:  Tonsillar exudate, uvula midline.  No PTA or RPA.  Medical Decision Making  Medically screening exam initiated at 6:10 PM.  Appropriate orders placed.  Debbie Mercer was informed that the remainder of the evaluation will be completed by another provider, this initial triage assessment does not replace that evaluation, and the importance of remaining in the ED until their evaluation is complete.     Nora Lauraine LABOR, PA-C 11/04/23 1811

## 2023-11-04 NOTE — ED Triage Notes (Signed)
 Pt states I think I have strep throat and also wants a pregnancy test. LMP 8/28
